# Patient Record
Sex: Male | Born: 1935 | Race: Black or African American | Hispanic: No | Marital: Married | Smoking: Former smoker
Health system: Southern US, Community
[De-identification: ages and names within clinical notes are randomized; demographics above are authoritative.]

## PROBLEM LIST (undated history)

## (undated) DIAGNOSIS — I872 Venous insufficiency (chronic) (peripheral): Secondary | ICD-10-CM

## (undated) DIAGNOSIS — R569 Unspecified convulsions: Secondary | ICD-10-CM

## (undated) DIAGNOSIS — E785 Hyperlipidemia, unspecified: Secondary | ICD-10-CM

## (undated) DIAGNOSIS — N529 Male erectile dysfunction, unspecified: Secondary | ICD-10-CM

## (undated) DIAGNOSIS — I10 Essential (primary) hypertension: Secondary | ICD-10-CM

## (undated) DIAGNOSIS — L821 Other seborrheic keratosis: Secondary | ICD-10-CM

## (undated) DIAGNOSIS — I509 Heart failure, unspecified: Secondary | ICD-10-CM

## (undated) DIAGNOSIS — M1990 Unspecified osteoarthritis, unspecified site: Secondary | ICD-10-CM

## (undated) DIAGNOSIS — C44629 Squamous cell carcinoma of skin of left upper limb, including shoulder: Secondary | ICD-10-CM

## (undated) HISTORY — PX: HERNIA REPAIR: SHX51

## (undated) HISTORY — DX: Essential (primary) hypertension: I10

## (undated) HISTORY — DX: Hyperlipidemia, unspecified: E78.5

## (undated) HISTORY — DX: Heart failure, unspecified: I50.9

## (undated) HISTORY — PX: EYE SURGERY: SHX253

## (undated) HISTORY — DX: Squamous cell carcinoma of skin of left upper limb, including shoulder: C44.629

## (undated) HISTORY — PX: SKIN CANCER EXCISION: SHX779

## (undated) HISTORY — DX: Male erectile dysfunction, unspecified: N52.9

## (undated) HISTORY — DX: Venous insufficiency (chronic) (peripheral): I87.2

## (undated) HISTORY — DX: Other seborrheic keratosis: L82.1

## (undated) HISTORY — DX: Unspecified osteoarthritis, unspecified site: M19.90

---

## 1997-04-05 DIAGNOSIS — R569 Unspecified convulsions: Secondary | ICD-10-CM

## 1997-04-05 HISTORY — DX: Unspecified convulsions: R56.9

## 1997-09-10 ENCOUNTER — Other Ambulatory Visit: Admission: RE | Admit: 1997-09-10 | Discharge: 1997-09-10 | Payer: Self-pay | Admitting: Family Medicine

## 1997-10-01 ENCOUNTER — Ambulatory Visit (HOSPITAL_COMMUNITY): Admission: RE | Admit: 1997-10-01 | Discharge: 1997-10-01 | Payer: Self-pay | Admitting: Family Medicine

## 2002-04-05 DIAGNOSIS — I509 Heart failure, unspecified: Secondary | ICD-10-CM

## 2002-04-05 HISTORY — DX: Heart failure, unspecified: I50.9

## 2002-11-10 ENCOUNTER — Encounter: Payer: Self-pay | Admitting: Emergency Medicine

## 2002-11-10 ENCOUNTER — Inpatient Hospital Stay (HOSPITAL_COMMUNITY): Admission: AD | Admit: 2002-11-10 | Discharge: 2002-11-14 | Payer: Self-pay | Admitting: Emergency Medicine

## 2002-11-12 ENCOUNTER — Encounter (INDEPENDENT_AMBULATORY_CARE_PROVIDER_SITE_OTHER): Payer: Self-pay | Admitting: Cardiology

## 2002-11-12 ENCOUNTER — Encounter: Payer: Self-pay | Admitting: Family Medicine

## 2002-11-14 ENCOUNTER — Encounter: Payer: Self-pay | Admitting: Internal Medicine

## 2003-12-06 ENCOUNTER — Ambulatory Visit: Payer: Self-pay | Admitting: *Deleted

## 2003-12-23 ENCOUNTER — Ambulatory Visit: Payer: Self-pay | Admitting: Nurse Practitioner

## 2003-12-23 ENCOUNTER — Ambulatory Visit: Payer: Self-pay | Admitting: *Deleted

## 2004-01-14 ENCOUNTER — Ambulatory Visit: Payer: Self-pay | Admitting: Nurse Practitioner

## 2004-01-28 ENCOUNTER — Ambulatory Visit: Payer: Self-pay | Admitting: Nurse Practitioner

## 2004-02-11 ENCOUNTER — Ambulatory Visit: Payer: Self-pay | Admitting: Nurse Practitioner

## 2004-03-16 ENCOUNTER — Ambulatory Visit: Payer: Self-pay | Admitting: Nurse Practitioner

## 2004-03-31 ENCOUNTER — Ambulatory Visit: Payer: Self-pay | Admitting: Nurse Practitioner

## 2004-11-18 ENCOUNTER — Ambulatory Visit: Payer: Self-pay | Admitting: Internal Medicine

## 2004-12-04 ENCOUNTER — Inpatient Hospital Stay (HOSPITAL_COMMUNITY): Admission: EM | Admit: 2004-12-04 | Discharge: 2004-12-09 | Payer: Self-pay | Admitting: Emergency Medicine

## 2004-12-04 ENCOUNTER — Ambulatory Visit: Payer: Self-pay | Admitting: Internal Medicine

## 2004-12-11 ENCOUNTER — Ambulatory Visit: Payer: Self-pay | Admitting: Internal Medicine

## 2004-12-14 ENCOUNTER — Ambulatory Visit: Payer: Self-pay | Admitting: Internal Medicine

## 2005-01-02 ENCOUNTER — Emergency Department (HOSPITAL_COMMUNITY): Admission: EM | Admit: 2005-01-02 | Discharge: 2005-01-02 | Payer: Self-pay | Admitting: Emergency Medicine

## 2005-01-19 ENCOUNTER — Ambulatory Visit: Payer: Self-pay | Admitting: Internal Medicine

## 2005-01-22 ENCOUNTER — Ambulatory Visit: Payer: Self-pay | Admitting: Internal Medicine

## 2005-01-26 ENCOUNTER — Inpatient Hospital Stay (HOSPITAL_COMMUNITY): Admission: RE | Admit: 2005-01-26 | Discharge: 2005-01-28 | Payer: Self-pay | Admitting: Urology

## 2005-01-26 ENCOUNTER — Encounter (INDEPENDENT_AMBULATORY_CARE_PROVIDER_SITE_OTHER): Payer: Self-pay | Admitting: *Deleted

## 2005-09-17 ENCOUNTER — Ambulatory Visit: Payer: Self-pay | Admitting: Internal Medicine

## 2005-09-24 ENCOUNTER — Ambulatory Visit: Payer: Self-pay | Admitting: Internal Medicine

## 2005-12-23 ENCOUNTER — Ambulatory Visit: Payer: Self-pay | Admitting: Internal Medicine

## 2006-02-09 DIAGNOSIS — I10 Essential (primary) hypertension: Secondary | ICD-10-CM | POA: Insufficient documentation

## 2006-02-09 DIAGNOSIS — F528 Other sexual dysfunction not due to a substance or known physiological condition: Secondary | ICD-10-CM | POA: Insufficient documentation

## 2006-04-26 DIAGNOSIS — E785 Hyperlipidemia, unspecified: Secondary | ICD-10-CM

## 2006-05-27 ENCOUNTER — Ambulatory Visit: Payer: Self-pay | Admitting: Internal Medicine

## 2006-05-27 ENCOUNTER — Encounter (INDEPENDENT_AMBULATORY_CARE_PROVIDER_SITE_OTHER): Payer: Self-pay | Admitting: Internal Medicine

## 2006-05-27 DIAGNOSIS — M1990 Unspecified osteoarthritis, unspecified site: Secondary | ICD-10-CM | POA: Insufficient documentation

## 2006-05-27 LAB — CONVERTED CEMR LAB
ALT: 14 units/L (ref 0–53)
AST: 17 units/L (ref 0–37)
Albumin: 4.5 g/dL (ref 3.5–5.2)
Alkaline Phosphatase: 81 units/L (ref 39–117)
BUN: 13 mg/dL (ref 6–23)
CO2: 24 meq/L (ref 19–32)
Calcium: 9.5 mg/dL (ref 8.4–10.5)
Chloride: 104 meq/L (ref 96–112)
Creatinine, Ser: 1.12 mg/dL (ref 0.40–1.50)
Glucose, Bld: 101 mg/dL — ABNORMAL HIGH (ref 70–99)
Potassium: 4.2 meq/L (ref 3.5–5.3)
Sodium: 140 meq/L (ref 135–145)
Total Bilirubin: 0.6 mg/dL (ref 0.3–1.2)
Total Protein: 8.2 g/dL (ref 6.0–8.3)

## 2006-05-31 ENCOUNTER — Encounter (INDEPENDENT_AMBULATORY_CARE_PROVIDER_SITE_OTHER): Payer: Self-pay | Admitting: Internal Medicine

## 2006-05-31 ENCOUNTER — Ambulatory Visit: Payer: Self-pay | Admitting: Internal Medicine

## 2006-05-31 LAB — CONVERTED CEMR LAB
Cholesterol: 152 mg/dL (ref 0–200)
HDL: 43 mg/dL (ref >39)
LDL Cholesterol: 92 mg/dL (ref 0–99)
Triglycerides: 86 mg/dL (ref <150)
VLDL: 17 mg/dL (ref 0–40)

## 2006-06-06 ENCOUNTER — Telehealth (INDEPENDENT_AMBULATORY_CARE_PROVIDER_SITE_OTHER): Payer: Self-pay | Admitting: *Deleted

## 2006-06-21 ENCOUNTER — Ambulatory Visit: Payer: Self-pay | Admitting: Gastroenterology

## 2006-06-29 ENCOUNTER — Ambulatory Visit: Payer: Self-pay | Admitting: Internal Medicine

## 2006-07-04 ENCOUNTER — Encounter (INDEPENDENT_AMBULATORY_CARE_PROVIDER_SITE_OTHER): Payer: Self-pay | Admitting: Internal Medicine

## 2006-07-04 ENCOUNTER — Ambulatory Visit: Payer: Self-pay | Admitting: Gastroenterology

## 2006-07-04 LAB — HM COLONOSCOPY: HM Colonoscopy: NORMAL

## 2006-07-28 ENCOUNTER — Ambulatory Visit: Payer: Self-pay | Admitting: Internal Medicine

## 2006-08-04 ENCOUNTER — Telehealth: Payer: Self-pay | Admitting: *Deleted

## 2006-08-20 ENCOUNTER — Emergency Department (HOSPITAL_COMMUNITY): Admission: EM | Admit: 2006-08-20 | Discharge: 2006-08-20 | Payer: Self-pay | Admitting: Emergency Medicine

## 2006-09-12 ENCOUNTER — Encounter (INDEPENDENT_AMBULATORY_CARE_PROVIDER_SITE_OTHER): Payer: Self-pay | Admitting: Internal Medicine

## 2006-09-20 ENCOUNTER — Encounter (INDEPENDENT_AMBULATORY_CARE_PROVIDER_SITE_OTHER): Payer: Self-pay | Admitting: General Surgery

## 2006-09-20 ENCOUNTER — Ambulatory Visit (HOSPITAL_BASED_OUTPATIENT_CLINIC_OR_DEPARTMENT_OTHER): Admission: RE | Admit: 2006-09-20 | Discharge: 2006-09-20 | Payer: Self-pay | Admitting: General Surgery

## 2006-11-07 ENCOUNTER — Telehealth (INDEPENDENT_AMBULATORY_CARE_PROVIDER_SITE_OTHER): Payer: Self-pay | Admitting: Internal Medicine

## 2007-05-30 ENCOUNTER — Telehealth (INDEPENDENT_AMBULATORY_CARE_PROVIDER_SITE_OTHER): Payer: Self-pay | Admitting: Internal Medicine

## 2007-06-19 ENCOUNTER — Telehealth (INDEPENDENT_AMBULATORY_CARE_PROVIDER_SITE_OTHER): Payer: Self-pay | Admitting: Internal Medicine

## 2007-11-27 ENCOUNTER — Ambulatory Visit: Payer: Self-pay | Admitting: Internal Medicine

## 2007-11-29 ENCOUNTER — Ambulatory Visit: Payer: Self-pay | Admitting: *Deleted

## 2007-11-29 ENCOUNTER — Encounter (INDEPENDENT_AMBULATORY_CARE_PROVIDER_SITE_OTHER): Payer: Self-pay | Admitting: Internal Medicine

## 2007-11-29 LAB — CONVERTED CEMR LAB
ALT: 14 units/L (ref 0–53)
AST: 17 units/L (ref 0–37)
Albumin: 4.3 g/dL (ref 3.5–5.2)
Alkaline Phosphatase: 60 units/L (ref 39–117)
BUN: 19 mg/dL (ref 6–23)
CO2: 22 meq/L (ref 19–32)
Calcium: 9.2 mg/dL (ref 8.4–10.5)
Chloride: 110 meq/L (ref 96–112)
Creatinine, Ser: 1.2 mg/dL (ref 0.40–1.50)
Glucose, Bld: 99 mg/dL (ref 70–99)
Hgb A1c MFr Bld: 5.4 %
Potassium: 4.7 meq/L (ref 3.5–5.3)
Sodium: 143 meq/L (ref 135–145)
Total Bilirubin: 0.7 mg/dL (ref 0.3–1.2)
Total Protein: 7.6 g/dL (ref 6.0–8.3)

## 2008-01-18 ENCOUNTER — Telehealth (INDEPENDENT_AMBULATORY_CARE_PROVIDER_SITE_OTHER): Payer: Self-pay | Admitting: Internal Medicine

## 2008-05-24 ENCOUNTER — Telehealth (INDEPENDENT_AMBULATORY_CARE_PROVIDER_SITE_OTHER): Payer: Self-pay | Admitting: Internal Medicine

## 2008-07-16 ENCOUNTER — Telehealth (INDEPENDENT_AMBULATORY_CARE_PROVIDER_SITE_OTHER): Payer: Self-pay | Admitting: Internal Medicine

## 2008-12-11 ENCOUNTER — Encounter: Payer: Self-pay | Admitting: Internal Medicine

## 2008-12-12 ENCOUNTER — Ambulatory Visit: Payer: Self-pay | Admitting: Internal Medicine

## 2008-12-12 ENCOUNTER — Inpatient Hospital Stay (HOSPITAL_COMMUNITY): Admission: EM | Admit: 2008-12-12 | Discharge: 2008-12-16 | Payer: Self-pay | Admitting: Emergency Medicine

## 2008-12-12 LAB — CONVERTED CEMR LAB
Cholesterol: 100 mg/dL
HDL: 34 mg/dL
LDL Cholesterol: 54 mg/dL
Total CHOL/HDL Ratio: 2.9
Triglycerides: 59 mg/dL
VLDL: 12 mg/dL

## 2008-12-16 ENCOUNTER — Encounter: Payer: Self-pay | Admitting: Internal Medicine

## 2008-12-26 ENCOUNTER — Ambulatory Visit: Payer: Self-pay | Admitting: Internal Medicine

## 2008-12-26 DIAGNOSIS — I5032 Chronic diastolic (congestive) heart failure: Secondary | ICD-10-CM

## 2008-12-27 ENCOUNTER — Encounter (INDEPENDENT_AMBULATORY_CARE_PROVIDER_SITE_OTHER): Payer: Self-pay | Admitting: Internal Medicine

## 2008-12-27 LAB — CONVERTED CEMR LAB
BUN: 11 mg/dL (ref 6–23)
Bilirubin Urine: NEGATIVE
CO2: 20 meq/L (ref 19–32)
Calcium: 9.1 mg/dL (ref 8.4–10.5)
Chloride: 108 meq/L (ref 96–112)
Creatinine, Ser: 1.01 mg/dL (ref 0.40–1.50)
Glucose, Bld: 81 mg/dL (ref 70–99)
HCT: 37.8 % — ABNORMAL LOW (ref 39.0–52.0)
Hemoglobin, Urine: NEGATIVE
Hemoglobin: 11.8 g/dL — ABNORMAL LOW (ref 13.0–17.0)
Ketones, ur: NEGATIVE mg/dL
Leukocytes, UA: NEGATIVE
MCHC: 31.2 g/dL (ref 30.0–36.0)
MCV: 96.7 fL (ref >=78.0)
Nitrite: NEGATIVE
Platelets: 293 10*3/uL (ref 150–400)
Potassium: 3.8 meq/L (ref 3.5–5.3)
Protein, ur: NEGATIVE mg/dL
RBC: 3.91 M/uL — ABNORMAL LOW (ref 4.22–5.81)
RDW: 13.6 % (ref 11.5–15.5)
Sodium: 141 meq/L (ref 135–145)
Specific Gravity, Urine: 1.015 (ref 1.005–1.0)
Urine Glucose: NEGATIVE mg/dL
Urobilinogen, UA: 0.2 (ref 0.0–1.0)
WBC: 6.4 10*3/uL (ref 4.0–10.5)
pH: 5.5 (ref 5.0–8.0)

## 2009-01-27 ENCOUNTER — Ambulatory Visit: Payer: Self-pay | Admitting: Internal Medicine

## 2009-01-27 LAB — CONVERTED CEMR LAB
BUN: 19 mg/dL (ref 6–23)
CO2: 21 meq/L (ref 19–32)
Calcium: 9.4 mg/dL (ref 8.4–10.5)
Chloride: 108 meq/L (ref 96–112)
Creatinine, Ser: 1.24 mg/dL (ref 0.40–1.50)
Glucose, Bld: 96 mg/dL (ref 70–99)
LDL Goal: 130 mg/dL
Potassium: 4.7 meq/L (ref 3.5–5.3)
Sodium: 142 meq/L (ref 135–145)

## 2009-03-03 ENCOUNTER — Ambulatory Visit: Payer: Self-pay | Admitting: Internal Medicine

## 2009-03-03 LAB — CONVERTED CEMR LAB
BUN: 18 mg/dL (ref 6–23)
CO2: 19 meq/L (ref 19–32)
Calcium: 9.2 mg/dL (ref 8.4–10.5)
Chloride: 104 meq/L (ref 96–112)
Creatinine, Ser: 1.07 mg/dL (ref 0.40–1.50)
Glucose, Bld: 101 mg/dL — ABNORMAL HIGH (ref 70–99)
Potassium: 4.6 meq/L (ref 3.5–5.3)
Sodium: 142 meq/L (ref 135–145)

## 2009-08-12 ENCOUNTER — Ambulatory Visit: Payer: Self-pay | Admitting: Internal Medicine

## 2009-08-13 LAB — CONVERTED CEMR LAB
ALT: 15 units/L (ref 0–53)
AST: 19 units/L (ref 0–37)
Albumin: 4.3 g/dL (ref 3.5–5.2)
Alkaline Phosphatase: 71 units/L (ref 39–117)
BUN: 11 mg/dL (ref 6–23)
CO2: 25 meq/L (ref 19–32)
Calcium: 9.1 mg/dL (ref 8.4–10.5)
Chloride: 105 meq/L (ref 96–112)
Creatinine, Ser: 0.97 mg/dL (ref 0.40–1.50)
Glucose, Bld: 91 mg/dL (ref 70–99)
Potassium: 3.8 meq/L (ref 3.5–5.3)
Sodium: 141 meq/L (ref 135–145)
Total Bilirubin: 0.5 mg/dL (ref 0.3–1.2)
Total Protein: 7.6 g/dL (ref 6.0–8.3)

## 2009-11-25 ENCOUNTER — Encounter: Payer: Self-pay | Admitting: Internal Medicine

## 2010-01-06 ENCOUNTER — Encounter: Payer: Self-pay | Admitting: Internal Medicine

## 2010-03-17 ENCOUNTER — Telehealth: Payer: Self-pay | Admitting: Internal Medicine

## 2010-03-24 ENCOUNTER — Ambulatory Visit: Payer: Self-pay | Admitting: Internal Medicine

## 2010-04-16 ENCOUNTER — Telehealth: Payer: Self-pay | Admitting: Internal Medicine

## 2010-05-05 NOTE — Consult Note (Signed)
Summary: MARK FEATHERSTON,MD  MARK FEATHERSTON,MD   Imported By: Louretta Parma 02/04/2010 12:14:16  _____________________________________________________________________  External Attachment:    Type:   Image     Comment:   External Document

## 2010-05-05 NOTE — Assessment & Plan Note (Signed)
Summary: est-ck/fu/meds/cfb   Vital Signs:  Patient profile:   75 year old male Height:      74 inches (187.96 cm) Weight:      192.5 pounds (84.50 kg) BMI:     24.80 Temp:     97.2 degrees F (36.22 degrees C) oral Pulse rate:   65 / minute BP sitting:   157 / 85  (right arm) Cuff size:   regular  Vitals Entered By: Theotis Barrio NT II (Aug 12, 2009 2:54 PM)  Serial Vital Signs/Assessments:  Time      Position  BP       Pulse  Resp  Temp     By 3:28 PM             136/76                         Nilda Riggs MD  Comments: 3:28 PM Re-checked by Dr. Logan Bores in exam room. By: Nilda Riggs MD   CC: PATIENT STATES HE FOR ROUTINE OFFICE VISIT FOR MEDICATION REFILL/ NO CONCERNS NOR COMPLAINTS Is Patient Diabetic? No Pain Assessment Patient in pain? no      Nutritional Status BMI of 19 -24 = normal  Have you ever been in a relationship where you felt threatened, hurt or afraid?No   Does patient need assistance? Functional Status Self care Ambulation Normal Comments ROUTINE OFFICE VISIT / MEDICATION REFILL / NO CONCERNS NOR COMPLAINTS   Primary Care Provider:  Nilda Riggs MD  CC:  PATIENT STATES HE FOR ROUTINE OFFICE VISIT FOR MEDICATION REFILL/ NO CONCERNS NOR COMPLAINTS.  History of Present Illness: Patient is here for durg refills and states that he is otherwise doing fine medically. He has no complaints with the exception of the cost of cialis. A full review of systems was performed and the patient denies visual changes (he just got new glasses as well), headaches, chest pain, shortness of breath, abdominal complaints, urinary complaints, weakness, or joint pains.  Preventive Screening-Counseling & Management  Alcohol-Tobacco     Alcohol drinks/day: 0     Smoking Status: quit     Year Quit: 1983     Pack years: 10     Passive Smoke Exposure: no  Caffeine-Diet-Exercise     Does Patient Exercise: yes     Type of exercise: walking and yard work     Times/week:  7  Current Problems (verified): 1)  CHF  (ICD-428.0) 2)  Preventive Health Care  (ICD-V70.0) 3)  Antihyperlipidemic Use, Long Term  (ICD-V58.69) 4)  Osteoarthritis  (ICD-715.90) 5)  Hypertension  (ICD-401.9) 6)  Hyperlipidemia  (ICD-272.4) 7)  Dependent Edema, Legs  (ICD-782.3) 8)  Erectile Dysfunction  (ICD-302.72)  Medications Prior to Update: 1)  Metoprolol Tartrate 25 Mg Tabs (Metoprolol Tartrate) .... Take One Tablet By Mouth Two Times A Day 2)  Simvastatin 40 Mg Tabs (Simvastatin) .... Take One Tablet By Mouth Once Daily 3)  Cialis 10 Mg Tabs (Tadalafil) .... Take At Least 1 Hour Prior To Intended Intercourse 4)  Potassium Chloride 20 Meq Pack (Potassium Chloride) .... Take 1 Tablet By Mouth Once A Day 5)  Furosemide 40 Mg Tabs (Furosemide) .... Take 1 Tablet By Mouth Once A Day  Current Medications (verified): 1)  Metoprolol Tartrate 25 Mg Tabs (Metoprolol Tartrate) .... Take One Tablet By Mouth Two Times A Day 2)  Simvastatin 40 Mg Tabs (Simvastatin) .... Take One Tablet By Mouth Once Daily 3)  Cialis  10 Mg Tabs (Tadalafil) .... Take At Least 1 Hour Prior To Intended Intercourse 4)  Potassium Chloride 20 Meq Pack (Potassium Chloride) .... Take 1 Tablet By Mouth Once A Day 5)  Furosemide 40 Mg Tabs (Furosemide) .... Take 1 Tablet By Mouth Once A Day  Allergies (verified): 1)  ! Fosinopril Sodium (Fosinopril Sodium) 2)  ! * "ace Inhibitors"  Social History: Lives in [place] with wife. Retired from a [employer], and was a Engineer, production. Quit smoking in 1980. Used to smoke 1.5 pk/day x 4years. No Etoh, No IVDU.  Review of Systems      See HPI  Physical Exam  Extremities:  Chronic edema, not much pitting. Patient states that the swelling is stable and resolves at night with elevation of the legs.   Impression & Recommendations:  Problem # 1:  HYPERTENSION (ICD-401.9) Rechecked in the room - 136/76. Unchanged from last visit, continue current meds. Refill potassium  given Lasix use and check CMET.  His updated medication list for this problem includes:    Metoprolol Tartrate 25 Mg Tabs (Metoprolol tartrate) .Marland Kitchen... Take one tablet by mouth two times a day    Furosemide 40 Mg Tabs (Furosemide) .Marland Kitchen... Take 1 tablet by mouth once a day  BP today: 157/85 Prior BP: 130/70 (03/03/2009)  Prior 10 Yr Risk Heart Disease: 22 % (01/27/2009)  Labs Reviewed: K+: 4.6 (03/03/2009) Creat: : 1.07 (03/03/2009)   Chol: 100 (12/12/2008)   HDL: 34 (12/12/2008)   LDL: 54 (12/12/2008)   TG: 59 (12/12/2008)  Problem # 2:  HYPERLIPIDEMIA (ICD-272.4) On statin, no LFTs since 2009, recheck today. Last cholesterol studies were excellent.  His updated medication list for this problem includes:    Simvastatin 40 Mg Tabs (Simvastatin) .Marland Kitchen... Take one tablet by mouth once daily  Problem # 3:  DEPENDENT EDEMA, LEGS (ICD-782.3) Patient states that edema is at baseline. Legs are visually large in circumference, especially noticeable at the level of the ankles, but patient denies any complaints or recent change in the size of his legs. Mild pitting, but not impressive. Patient continues to elevate legs when at home and states that this manages the edema well. Encouraged to continue with those measures to control the edema.  His updated medication list for this problem includes:    Furosemide 40 Mg Tabs (Furosemide) .Marland Kitchen... Take 1 tablet by mouth once a day     Problem # 4:  ERECTILE DYSFUNCTION (ICD-302.72) Refilled medication in case the patient decides to fill the prescription. States that he doesn't appreciate the high cost of the medication.  His updated medication list for this problem includes:    Cialis 10 Mg Tabs (Tadalafil) .Marland Kitchen... Take at least 1 hour prior to intended intercourse  Complete Medication List: 1)  Metoprolol Tartrate 25 Mg Tabs (Metoprolol tartrate) .... Take one tablet by mouth two times a day 2)  Simvastatin 40 Mg Tabs (Simvastatin) .... Take one tablet by  mouth once daily 3)  Cialis 10 Mg Tabs (Tadalafil) .... Take at least 1 hour prior to intended intercourse 4)  Potassium Chloride 20 Meq Pack (Potassium chloride) .... Take 1 tablet by mouth once a day 5)  Furosemide 40 Mg Tabs (Furosemide) .... Take 1 tablet by mouth once a day  Other Orders: T-Comprehensive Metabolic Panel ([id number])  Patient Instructions: 1)  Please schedule a follow-up appointment in 6 months. 2)  If you have any acute issues prior to your visit, please arrange an earlier appointment. 3)  Check your Blood  Pressure regularly. If it is above: 160/100 you should make an appointment. Prescriptions: FUROSEMIDE 40 MG TABS (FUROSEMIDE) Take 1 tablet by mouth once a day  #30 x 6   Entered and Authorized by:   Nilda Riggs MD   Signed by:   Nilda Riggs MD on 08/12/2009   Method used:   Electronically to        [facility] [address] [phone]* (retail)       [address]       [place], [place]  [address]       Ph: [phone]       Fax: [phone]   RxID:   [id number] POTASSIUM CHLORIDE 20 MEQ PACK (POTASSIUM CHLORIDE) Take 1 tablet by mouth once a day  #30 Tablet x 6   Entered and Authorized by:   Nilda Riggs MD   Signed by:   Nilda Riggs MD on 08/12/2009   Method used:   Electronically to        [facility] [address] [phone]* (retail)       [address]       [place], [place]  [address]       Ph: [phone]       Fax: [phone]   RxID:   [id number] CIALIS 10 MG TABS (TADALAFIL) Take at least 1 hour prior to intended intercourse  #10 x 4   Entered and Authorized by:   Nilda Riggs MD   Signed by:   Nilda Riggs MD on 08/12/2009   Method used:   Electronically to        [facility] [address] [phone]* (retail)       [address]       [place], [place]  [address]       Ph: [phone]       Fax: [phone]   RxID:   [id number] SIMVASTATIN 40 MG TABS (SIMVASTATIN) Take one tablet by mouth once daily  #31 Tablet x 10   Entered and Authorized  by:   Nilda Riggs MD   Signed by:   Nilda Riggs MD on 08/12/2009   Method used:   Electronically to        [facility] [address] [phone]* (retail)       [address]       [place], [place]  [address]       Ph: [phone]       Fax: [phone]   RxID:   [id number] METOPROLOL TARTRATE 25 MG TABS (METOPROLOL TARTRATE) Take one tablet by mouth two times a day  #62 x 11   Entered and Authorized by:   Nilda Riggs MD   Signed by:   Nilda Riggs MD on 08/12/2009   Method used:   Electronically to        [facility] [address] [phone]* (retail)       [address]       [place], [place]  [address]       Ph: [phone]       Fax: [phone]   RxID:   [id number]  Process Orders Check Orders Results:     [facility]: Check successful Tests Sent for requisitioning (Aug 12, 2009 5:47 PM):     08/12/2009: [facility] -- T-Comprehensive Metabolic Panel [phone] (signed)    Prevention & Chronic Care Immunizations   Influenza vaccine: Fluvax MCR  (03/03/2009)    Tetanus booster: 03/03/2009: Tdap    Pneumococcal vaccine: Pneumovax (Medicare)  (  03/03/2009)    H. zoster vaccine: Not documented   H. zoster vaccine deferral: Deferred  (03/03/2009)  Colorectal Screening   Hemoccult: Not documented   Hemoccult action/deferral: Deferred  (03/03/2009)    Colonoscopy: normal  (07/04/2006)   Colonoscopy due: 07/2016  Other Screening   PSA: Not documented   PSA action/deferral: Discussion deferred  (03/03/2009)   Smoking status: quit  (08/12/2009)  Lipids   Total Cholesterol: 100  (12/12/2008)   LDL: 54  (12/12/2008)   LDL Direct: Not documented   HDL: 34  (12/12/2008)   Triglycerides: 59  (12/12/2008)    SGOT (AST): 17  (11/29/2007)   BMP action: Ordered   SGPT (ALT): 14  (11/29/2007) CMP ordered    Alkaline phosphatase: 60  (11/29/2007)   Total bilirubin: 0.7  (11/29/2007)    Lipid flowsheet reviewed?: Yes   Progress toward  LDL goal: At goal  Hypertension   Last Blood Pressure: 157 / 85  (08/12/2009)   Serum creatinine: 1.07  (03/03/2009)   BMP action: Ordered   Serum potassium 4.6  (03/03/2009) CMP ordered     Hypertension flowsheet reviewed?: Yes   Progress toward BP goal: Unchanged  Self-Management Support :   Personal Goals (by the next clinic visit) :      Personal blood pressure goal: 140/90  (01/27/2009)     Personal LDL goal: 130  (01/27/2009)    Patient will work on the following items until the next clinic visit to reach self-care goals:     Medications and monitoring: take my medicines every day, bring all of my medications to every visit  (08/12/2009)     Eating: drink diet soda or water instead of juice or soda, eat more vegetables, use fresh or frozen vegetables, eat foods that are low in salt, eat baked foods instead of fried foods, limit or avoid alcohol  (08/12/2009)     Activity: take a 30 minute walk every day  (08/12/2009)    Hypertension self-management support: Resources for patients handout  (08/12/2009)    Lipid self-management support: Resources for patients handout  (08/12/2009)     Self-management comments: PATIET STATES HE AND HIS  WALKS EVERY DAY      Resource handout printed.

## 2010-05-05 NOTE — Consult Note (Signed)
Summary: [facility]  [facility]   Imported By: Louretta Parma 12/12/2009 10:59:00  _____________________________________________________________________  External Attachment:    Type:   Image     Comment:   External Document

## 2010-05-07 NOTE — Progress Notes (Signed)
Summary: Refill/gh  Phone Note Refill Request Message from:  Fax from Pharmacy on March 17, 2010 11:50 AM  Refills Requested: Medication #1:  POTASSIUM CHLORIDE 20 MEQ PACK Take 1 tablet by mouth once a day   Last Refilled: 02/16/2010  Method Requested: Electronic Initial call taken by: Angelina Ok RN,  March 17, 2010 11:50 AM  Follow-up for Phone Call        Call for appointment Follow-up by: Bethel Born MD,  March 17, 2010 1:48 PM    Prescriptions: POTASSIUM CHLORIDE 20 MEQ PACK (POTASSIUM CHLORIDE) Take 1 tablet by mouth once a day  #30 Tablet x 1   Entered and Authorized by:   Bethel Born MD   Signed by:   Bethel Born MD on 03/17/2010   Method used:   Electronically to        [facility] [address] [phone]* (retail)       [address]       [place], [place]  [address]       Ph: [phone]       Fax: [phone]   RxID:   [id number]

## 2010-05-07 NOTE — Progress Notes (Signed)
Summary: refill/ hla  Phone Note Refill Request Message from:  Fax from Pharmacy on April 16, 2010 1:54 PM  Refills Requested: Medication #1:  FUROSEMIDE 40 MG TABS Take 1 tablet by mouth once a day.   Dosage confirmed as above?Dosage Confirmed   Last Refilled: 12/15 Initial call taken by: Marin Roberts RN,  April 16, 2010 1:55 PM  Follow-up for Phone Call       Follow-up by: Bethel Born MD,  April 16, 2010 4:42 PM    Prescriptions: FUROSEMIDE 40 MG TABS (FUROSEMIDE) Take 1 tablet by mouth once a day  #30 x 6   Entered and Authorized by:   Bethel Born MD   Signed by:   Bethel Born MD on 04/16/2010   Method used:   Electronically to        [facility] [phone]* (retail)       [address]       [place], [place]  [address]       Ph: [phone]       Fax: [phone]   RxID:   [id number]

## 2010-05-07 NOTE — Assessment & Plan Note (Signed)
Summary: EST-CK/FU/MEDS/CFB   Vital Signs:  Patient profile:   75 year old male Height:      74 inches (187.96 cm) Weight:      179.9 pounds (81.77 kg) BMI:     23.18 Temp:     97.8 degrees F (36.56 degrees C) oral Pulse rate:   68 / minute BP sitting:   132 / 78  (left arm) Cuff size:   regular  Vitals Entered By: Cynda Familia Duncan Dull) (March 24, 2010 3:37 PM) CC: routine f/u Is Patient Diabetic? No Pain Assessment Patient in pain? no      Nutritional Status BMI of 19 -24 = normal  Have you ever been in a relationship where you felt threatened, hurt or afraid?No   Does patient need assistance? Functional Status Self care Ambulation Normal    Primary Care Provider:  Bethel Born MD  CC:  routine f/u.  History of Present Illness: 75 y/o with h/o HTN, HLD, chronic venous insufficiency comes for follow up  no new complaints today  HTN- BP is well cotrolled when he checks it at pharmacies. He has been complaint with meds.  HLD- last Lipid profile checked in 2009, at goal, not fasting today venous insufficicncy- is going to have surgery for symptomatic relief in near future, dates not decided yet  Current Medications (verified): 1)  Metoprolol Tartrate 25 Mg Tabs (Metoprolol Tartrate) .... Take One Tablet By Mouth Two Times A Day 2)  Simvastatin 40 Mg Tabs (Simvastatin) .... Take One Tablet By Mouth Once Daily 3)  Cialis 10 Mg Tabs (Tadalafil) .... Take At Least 1 Hour Prior To Intended Intercourse 4)  Potassium Chloride 20 Meq Pack (Potassium Chloride) .... Take 1 Tablet By Mouth Once A Day 5)  Furosemide 40 Mg Tabs (Furosemide) .... Take 1 Tablet By Mouth Once A Day  Allergies: 1)  ! Fosinopril Sodium (Fosinopril Sodium) 2)  ! * "ace Inhibitors"  Review of Systems  The patient denies anorexia, fever, weight loss, weight gain, vision loss, decreased hearing, hoarseness, chest pain, syncope, dyspnea on exertion, peripheral edema, prolonged cough, headaches,  hemoptysis, abdominal pain, melena, hematochezia, severe indigestion/heartburn, hematuria, incontinence, genital sores, muscle weakness, suspicious skin lesions, transient blindness, difficulty walking, depression, unusual weight change, abnormal bleeding, enlarged lymph nodes, angioedema, breast masses, and testicular masses.    Physical Exam  General:  Gen: VS reveiwed, Alert, well developed, nodistress ENT: mucous membranes pink & moist. No abnormal finds in ear and nose. CVC:S1 S2 , no murmurs, no abnormal heart sounds. Lungs: Clear to auscultation B/L. No wheezes, crackles or other abnormal sounds Abdomen: soft, non distended, no tender. Normal Bowel sounds EXT: no pitting edema, no engorged veins, Pulsations normal  Neuro:alert, oriented *3, cranial nerved 2-12 intact, strenght normal in all  extremities, senstations normal to light touch.      Impression & Recommendations:  Problem # 1:  CHF (ICD-428.0) no SOB, CP, or signs/symptoms of volume overload on lasix continue medications  His updated medication list for this problem includes:    Metoprolol Tartrate 25 Mg Tabs (Metoprolol tartrate) .Marland Kitchen... Take one tablet by mouth two times a day    Furosemide 40 Mg Tabs (Furosemide) .Marland Kitchen... Take 1 tablet by mouth once a day  Problem # 2:  HYPERTENSION (ICD-401.9) manually checked 132/90 conitnue current meds  His updated medication list for this problem includes:    Metoprolol Tartrate 25 Mg Tabs (Metoprolol tartrate) .Marland Kitchen... Take one tablet by mouth two times a day  Furosemide 40 Mg Tabs (Furosemide) .Marland Kitchen... Take 1 tablet by mouth once a day  Problem # 3:  HYPERLIPIDEMIA (ICD-272.4)  needs lipid profile, not fasting today will check it at next appointment continue statin at goal on last check  His updated medication list for this problem includes:    Simvastatin 40 Mg Tabs (Simvastatin) .Marland Kitchen... Take one tablet by mouth once daily  Labs Reviewed: SGOT: 19 (08/12/2009)   SGPT: 15  (08/12/2009)  Lipid Goals: LDL Goal: 130 (01/27/2009)     Prior 10 Yr Risk Heart Disease: 22 % (01/27/2009)   HDL:34 (12/12/2008), 43 (05/31/2006)  LDL:54 (12/12/2008), 92 (05/31/2006)  Chol:100 (12/12/2008), 152 (05/31/2006)  Trig:59 (12/12/2008), 86 (05/31/2006)  Problem # 4:  DEPENDENT EDEMA, LEGS (ICD-782.3) followed by vein specialist, is going to have surgery in few months  His updated medication list for this problem includes:    Furosemide 40 Mg Tabs (Furosemide) .Marland Kitchen... Take 1 tablet by mouth once a day  Complete Medication List: 1)  Metoprolol Tartrate 25 Mg Tabs (Metoprolol tartrate) .... Take one tablet by mouth two times a day 2)  Simvastatin 40 Mg Tabs (Simvastatin) .... Take one tablet by mouth once daily 3)  Cialis 10 Mg Tabs (Tadalafil) .... Take at least 1 hour prior to intended intercourse 4)  Potassium Chloride 20 Meq Pack (Potassium chloride) .... Take 1 tablet by mouth once a day 5)  Furosemide 40 Mg Tabs (Furosemide) .... Take 1 tablet by mouth once a day  Patient Instructions: 1)  Please schedule a follow-up appointment in 6 months.   Orders Added: 1)  Est. Patient Level IV [[address]]     Prevention & Chronic Care Immunizations   Influenza vaccine: Fluvax MCR  (03/03/2009)    Tetanus booster: 03/03/2009: Tdap    Pneumococcal vaccine: Pneumovax (Medicare)  (03/03/2009)    H. zoster vaccine: Not documented   H. zoster vaccine deferral: Deferred  (03/03/2009)  Colorectal Screening   Hemoccult: Not documented   Hemoccult action/deferral: Deferred  (03/03/2009)    Colonoscopy: normal  (07/04/2006)   Colonoscopy due: 07/2016  Other Screening   PSA: Not documented   PSA action/deferral: Discussion deferred  (03/03/2009)   Smoking status: quit  (08/12/2009)  Lipids   Total Cholesterol: 100  (12/12/2008)   LDL: 54  (12/12/2008)   LDL Direct: Not documented   HDL: 34  (12/12/2008)   Triglycerides: 59  (12/12/2008)    SGOT (AST): 19   (08/12/2009)   BMP action: Ordered   SGPT (ALT): 15  (08/12/2009)   Alkaline phosphatase: 71  (08/12/2009)   Total bilirubin: 0.5  (08/12/2009)    Lipid flowsheet reviewed?: Yes   Progress toward LDL goal: Unchanged  Hypertension   Last Blood Pressure: 132 / 78  (03/24/2010)   Serum creatinine: 0.97  (08/12/2009)   BMP action: Ordered   Serum potassium 3.8  (08/12/2009)    Hypertension flowsheet reviewed?: Yes   Progress toward BP goal: Improved  Self-Management Support :   Personal Goals (by the next clinic visit) :      Personal blood pressure goal: 140/90  (01/27/2009)     Personal LDL goal: 130  (01/27/2009)    Patient will work on the following items until the next clinic visit to reach self-care goals:     Medications and monitoring: take my medicines every day  (03/24/2010)     Eating: use fresh or frozen vegetables, eat foods that are low in salt, eat baked foods instead of fried foods  (  03/24/2010)     Activity: take a 30 minute walk every day  (08/12/2009)    Hypertension self-management support: Written self-care plan  (03/24/2010)   Hypertension self-care plan printed.    Lipid self-management support: Written self-care plan  (03/24/2010)   Lipid self-care plan printed.   Nursing Instructions: Give Flu vaccine today    Appended Document: flu vaccine//kg    Nurse Visit   Allergies: 1)  ! Fosinopril Sodium (Fosinopril Sodium) 2)  ! * "ace Inhibitors"  Orders Added: 1)  Flu Vaccine 72yrs + MEDICARE PATIENTS [Q2039] 2)  Administration Flu vaccine - MCR [G0008] Flu Vaccine Consent Questions     Do you have a history of severe allergic reactions to this vaccine? no    Any prior history of allergic reactions to egg and/or gelatin? no    Do you have a sensitivity to the preservative Thimersol? no    Do you have a past history of Guillan-Barre Syndrome? no    Do you currently have an acute febrile illness? no    Have you ever had a severe reaction to  latex? no    Vaccine information given and explained to patient? yes    Are you currently pregnant? no    Lot Number:[id number]   Exp Date:10/03/2010   Manufacturer: [employer]    Site Given  Left Deltoid IM.Cynda Familia [facility])  March 24, 2010 4:27 PM  .Neysa Bonito

## 2010-05-25 ENCOUNTER — Other Ambulatory Visit: Payer: Self-pay | Admitting: *Deleted

## 2010-05-26 MED ORDER — POTASSIUM CHLORIDE 20 MEQ PO PACK: 20.0000 meq | PACK | Freq: Two times a day (BID) | ORAL | Status: DC

## 2010-06-24 ENCOUNTER — Other Ambulatory Visit: Payer: Self-pay | Admitting: *Deleted

## 2010-06-24 MED ORDER — POTASSIUM CHLORIDE 20 MEQ PO PACK: 20.0000 meq | PACK | Freq: Two times a day (BID) | ORAL | Status: DC

## 2010-07-10 LAB — URINE CULTURE

## 2010-07-10 LAB — URINALYSIS, ROUTINE W REFLEX MICROSCOPIC
Bilirubin Urine: NEGATIVE
Glucose, UA: NEGATIVE mg/dL
Ketones, ur: NEGATIVE mg/dL
Nitrite: POSITIVE — AB
Protein, ur: 30 mg/dL — AB
Specific Gravity, Urine: 1.015 (ref 1.005–1.030)
Urobilinogen, UA: 1 mg/dL (ref 0.0–1.0)
pH: 5.5 (ref 5.0–8.0)

## 2010-07-10 LAB — BASIC METABOLIC PANEL
BUN: 11 mg/dL (ref 6–23)
BUN: 13 mg/dL (ref 6–23)
CO2: 26 mEq/L (ref 19–32)
CO2: 29 mEq/L (ref 19–32)
Calcium: 8.5 mg/dL (ref 8.4–10.5)
Calcium: 9 mg/dL (ref 8.4–10.5)
Chloride: 104 mEq/L (ref 96–112)
Chloride: 105 mEq/L (ref 96–112)
Creatinine, Ser: 0.99 mg/dL (ref 0.4–1.5)
Creatinine, Ser: 1.11 mg/dL (ref 0.4–1.5)
GFR calc Af Amer: >60 mL/min (ref >60)
Glucose, Bld: 94 mg/dL (ref 70–99)
Sodium: 140 mEq/L (ref 135–145)

## 2010-07-10 LAB — LEGIONELLA ANTIGEN, URINE: Legionella Antigen, Urine: NEGATIVE

## 2010-07-10 LAB — CARDIAC PANEL(CRET KIN+CKTOT+MB+TROPI)
Relative Index: INVALID (ref 0.0–2.5)
Troponin I: 0.1 ng/mL — ABNORMAL HIGH (ref 0.00–0.06)

## 2010-07-10 LAB — DIFFERENTIAL
Basophils Absolute: 0 10*3/uL (ref 0.0–0.1)
Basophils Absolute: 0 10*3/uL (ref 0.0–0.1)
Basophils Relative: 0 % (ref 0–1)
Eosinophils Absolute: 0 10*3/uL (ref 0.0–0.7)
Eosinophils Absolute: 0.1 10*3/uL (ref 0.0–0.7)
Eosinophils Relative: 0 % (ref 0–5)
Eosinophils Relative: 1 % (ref 0–5)
Lymphocytes Relative: 12 % (ref 12–46)
Lymphs Abs: 1 10*3/uL (ref 0.7–4.0)
Lymphs Abs: 1.4 10*3/uL (ref 0.7–4.0)
Monocytes Absolute: 1.6 10*3/uL — ABNORMAL HIGH (ref 0.1–1.0)
Monocytes Absolute: 1.7 10*3/uL — ABNORMAL HIGH (ref 0.1–1.0)
Monocytes Relative: 19 % — ABNORMAL HIGH (ref 3–12)
Neutro Abs: 5.8 10*3/uL (ref 1.7–7.7)
Neutrophils Relative %: 61 % (ref 43–77)
Neutrophils Relative %: 68 % (ref 43–77)

## 2010-07-10 LAB — COMPREHENSIVE METABOLIC PANEL
ALT: 20 U/L (ref 0–53)
AST: 26 U/L (ref 0–37)
Albumin: 3.6 g/dL (ref 3.5–5.2)
Alkaline Phosphatase: 62 U/L (ref 39–117)
BUN: 15 mg/dL (ref 6–23)
CO2: 24 mEq/L (ref 19–32)
Calcium: 9 mg/dL (ref 8.4–10.5)
Chloride: 103 mEq/L (ref 96–112)
Creatinine, Ser: 1.33 mg/dL (ref 0.4–1.5)
GFR calc Af Amer: >60 mL/min (ref >60)
GFR calc non Af Amer: 53 mL/min — ABNORMAL LOW (ref >60)
Glucose, Bld: 125 mg/dL — ABNORMAL HIGH (ref 70–99)
Potassium: 3.6 mEq/L (ref 3.5–5.1)
Sodium: 136 mEq/L (ref 135–145)
Total Bilirubin: 1.2 mg/dL (ref 0.3–1.2)
Total Protein: 7.1 g/dL (ref 6.0–8.3)

## 2010-07-10 LAB — CBC
HCT: 38.1 % — ABNORMAL LOW (ref 39.0–52.0)
HCT: 39.5 % (ref 39.0–52.0)
Hemoglobin: 12.9 g/dL — ABNORMAL LOW (ref 13.0–17.0)
Hemoglobin: 13.1 g/dL (ref 13.0–17.0)
MCHC: 32.6 g/dL (ref 30.0–36.0)
MCHC: 33.3 g/dL (ref 30.0–36.0)
MCHC: 33.4 g/dL (ref 30.0–36.0)
MCHC: 33.5 g/dL (ref 30.0–36.0)
MCHC: 33.8 g/dL (ref 30.0–36.0)
MCV: 95.8 fL (ref 78.0–100.0)
MCV: 96.6 fL (ref 78.0–100.0)
MCV: 96.7 fL (ref 78.0–100.0)
Platelets: 135 10*3/uL — ABNORMAL LOW (ref 150–400)
Platelets: 145 10*3/uL — ABNORMAL LOW (ref 150–400)
Platelets: 147 10*3/uL — ABNORMAL LOW (ref 150–400)
RBC: 3.94 MIL/uL — ABNORMAL LOW (ref 4.22–5.81)
RBC: 4.06 MIL/uL — ABNORMAL LOW (ref 4.22–5.81)
RBC: 4.08 MIL/uL — ABNORMAL LOW (ref 4.22–5.81)
RDW: 12.5 % (ref 11.5–15.5)
RDW: 12.7 % (ref 11.5–15.5)
RDW: 12.8 % (ref 11.5–15.5)
WBC: 6.7 10*3/uL (ref 4.0–10.5)
WBC: 8.5 10*3/uL (ref 4.0–10.5)

## 2010-07-10 LAB — STREP PNEUMONIAE URINARY ANTIGEN: Strep Pneumo Urinary Antigen: NEGATIVE

## 2010-07-10 LAB — BRAIN NATRIURETIC PEPTIDE: Pro B Natriuretic peptide (BNP): 290 pg/mL — ABNORMAL HIGH (ref 0.0–100.0)

## 2010-07-10 LAB — LIPID PANEL
Cholesterol: 100 mg/dL (ref 0–200)
HDL: 34 mg/dL — ABNORMAL LOW (ref >39)
LDL Cholesterol: 54 mg/dL (ref 0–99)
Total CHOL/HDL Ratio: 2.9 RATIO
VLDL: 12 mg/dL (ref 0–40)

## 2010-07-10 LAB — CK TOTAL AND CKMB (NOT AT ARMC)
CK, MB: 0.9 ng/mL (ref 0.3–4.0)
Relative Index: 0.7 (ref 0.0–2.5)
Total CK: 122 U/L (ref 7–232)

## 2010-07-10 LAB — URINE MICROSCOPIC-ADD ON

## 2010-07-10 LAB — CULTURE, BLOOD (ROUTINE X 2)

## 2010-07-27 ENCOUNTER — Other Ambulatory Visit: Payer: Self-pay | Admitting: *Deleted

## 2010-07-27 MED ORDER — POTASSIUM CHLORIDE 20 MEQ PO PACK: PACK | ORAL | Status: DC

## 2010-08-18 NOTE — Op Note (Signed)
NAME:  Timothy Bradford, Timothy Bradford                ACCOUNT NO.:  [id number]   MEDICAL RECORD NO.:  [id number]          PATIENT TYPE:  AMB   LOCATION:  [facility]                         FACILITY:  [facility]   PHYSICIAN:  Anselm Pancoast. Weatherly, M.D.DATE OF BIRTH:  [birth year]   DATE OF PROCEDURE:  09/20/2006  DATE OF DISCHARGE:                               OPERATIVE REPORT   PREOPERATIVE DIAGNOSIS:  Left inguinal hernia, probably direct.   POSTOPERATIVE DIAGNOSIS:  Left inguinal hernia with a pantaloon.   OPERATION:  Left inguinal herniorrhaphy with a mesh reinforcement.   ANESTHESIA:  Local with very heavy sedation.   SURGEON:  Anselm Pancoast. Zachery Dakins, MD.   ASSISTANT:  Nurse.   HISTORY:  [name] is a 75 year old male, who was referred to [place]  from the [facility] physicians where Mr. Winski is  managed.  He had actually been seen by Dr. Janee Morn in the [facility] ER with  the symptoms of a left inguinal hernia. The patient is on seizure  medications, but he has been well controlled.  He has had problems with  his prostate but had a TUR approximately a year-and-a-half ago and has  not had any problems with voiding since then.  I recommended that we  repair him as an outpatient and he is here for the planned procedure.  He was given a dose of Kefzol, taken back to the operative suite, we  elected to do him with an LOA tube.  The left groin was first clipped  and then prepped with Betadine solution and then the ilioinguinal nerve  area was anesthetized with a blunted 22-gauge needle, about 10 ml of the  mixture of 0.50% Xylocaine with Adrenaline and 0.25 of Marcaine.  The  skin was infiltrated with a mixture of the same solution and all total  about 38 ml of solution was used.  Sharp dissection down through the  skin layer of skin and thin layer of adipose tissue.  Two veins were  identified, clamped, divided, and ligated with #1 Vicryl, and the  external oblique aponeurosis was opened.  The cord  structures were  elevated and this was definitely a direct, but also an indirect  component coming down, and the hernia sac was separated from the cord  structures, opened, and then a high sac ligation was done with a #2-0  Surgilon and a second suture just passed and the hernia sac removed.  I  then repaired the floor with a modified Shouldice-type running up a #2-0  Prolene reinforcing the internal ring and then going back I then tied  the two ends together.  Next, a piece of Prolene mesh like a sail split  laterally was used to reinforce the floor.  The inferior limb was  sutured with a running #2-0 Prolene with two tails of suture around the  internal ring to reinforce it, and the ilioinguinal nerve was placed  with cord structures as it goes out through the ring.  The superior flap  was sutured out of the interrupted sutures of #2-0 Prolene and the mesh  is aligned not on excessive  tension, but snug.  The external oblique was  then closed with a #3-0 Vicryl, Scarpa's fascia was closed with  interrupted #3-0 Vicryl, and then a #4-0 Dexon subcuticular, and Benzoin  and Steri-Strips on the skin.  I had placed an additional Marcaine in  the floor where it was repaired and also where the high sac ligation had  been performed.  The patient's skin was closed with  Benzoin and Steri-Strips.  He tolerated the procedure nicely and was  breathing spontaneously and was sent to the recovery room in a stable  postop condition.  He will continue on all of his chronic medications  and will see [place] back in the office in approximately 10 days.           ______________________________  Anselm Pancoast. Zachery Dakins, M.D.     WJW/MEDQ  D:  09/20/2006  T:  09/20/2006  Job:  [id number]

## 2010-08-20 ENCOUNTER — Other Ambulatory Visit: Payer: Self-pay | Admitting: *Deleted

## 2010-08-20 MED ORDER — SIMVASTATIN 40 MG PO TABS: 40.0000 mg | ORAL_TABLET | Freq: Every day | ORAL | Status: DC

## 2010-08-21 NOTE — Discharge Summary (Signed)
NAME:  ANITA, MCADORY NO.:  [id number]   MEDICAL RECORD NO.:  [id number]          PATIENT TYPE:  INP   LOCATION:  3742                         FACILITY:  [facility]   PHYSICIAN:  Alvester Morin, M.D.  DATE OF BIRTH:  04-03-1936   DATE OF ADMISSION:  12/04/2004  DATE OF DISCHARGE:  12/09/2004                                 DISCHARGE SUMMARY   CONSULTATIONS:  Jamison Neighbor, M.D.   DISCHARGE DIAGNOSES:  1.  Urinary tract infection with hydronephrosis with positive culture to      enterobacter.  2.  Hypokalemia.  3.  History of hypertension.  4.  Anemia.  5.  Acute renal failure.   DISCHARGE MEDICATIONS:  1.  Levaquin 500 mg p.o. daily.  2.  Zocor 40 mg p.o. daily.  3.  Lasix 80 mg p.o. daily.  4.  Viagra 25 mg p.o. p.r.n.  5.  Magnesium oxide 400 mg daily.   The patient was discharged from telemetry unit with his fevers, chills,  diarrhea resolved, his appetite also improved upon discharge. At the time of  follow-up with Dr. Janee Morn of urgent care on Friday, September 8th at 11:45  a.m., he will need arrive at 11:15 for bloodwork at the clinic which  includes a BMET and magnesium level. He was also discharged with a Foley  catheter for his UTI/hydronephrosis and that will need be discontinued  during his visit with Dr. Logan Bores of urology. Dr. Logan Bores will also decide when  to discontinue the Levaquin 500 mg p.o. daily. Dr. Logan Bores had followup plans  to do a cystoscopy, folate studies, and post void residual checks. He will  also check a renal ultrasound to see if the ureters are decompressing. If  not, the patient will need additional studies such as CT scan or renal scan  to determine if obstruction is anything significant or if it is simply  hydronephrosis caused by some loss in the bladder outlet obstruction. The  patient may eventually require TURP to decompress his kidneys which would  allow additional retrograde studies to look at his kidneys to be done.  Abdominal CT without contrast done on bilateral hydronephrosis and  hydroureter, left lower lobe pneumonia, scattered hyperdensities throughout  the liver, and also small contracted bladder. Pelvis CT without contrast  done on December 05, 2004, showed bilateral hydroureter with moderately  thick walled bladder. No evidence for diverticulitis, pelvic free air, and  mild vascular calcification.  Digital chest x-ray, two view, showed scarring  of the lungs from previous infections, possibly tuberculosis. However, there  were no prior studies available for comparison. The patient also had urine  culture on December 04, 2004, that was positive for Enterobacter asburiae.  He also had an EKG done on December 05, 2004, showing sinus rhythm with  premature atrial complexes, left atrial enlargement, septal infarct with age  undetermined, with no significant changes since last EKG.   CONSULTATIONS:  Dr. Logan Bores of urology who saw the patient on December 08, 2004, who recommended that the patient be discharged with his Foley. He also  recommended that  the patient be discharged with Levaquin 500 mg p.o. daily.  He will follow up with Dr. Logan Bores on September 20th at 8:45 a.m. for further  workup of his bilateral nephrosis/UTI/Enterobacter positive urine culture.   BRIEF ADMISSION HISTORY:  The patient is a 75 year old African American male  with a past medical history of hypertension, one past seizure episode, lower  extremity edema, osteoarthritis, CHF, history of chest pain with five day  history of diarrhea, dizziness, stomach pain. During that time he was unable  to eat due to lack of appetite. He would have diarrhea after eating. He had  a fever of a reported 103.5 degrees the night prior to admission and there  is a question of a presyncopal episode according to the wife. Physical  examination was significant for a temperature of 102.4 in the emergency room  and he was found to be orthostatic. His  sitting blood pressure was 139/81,  heart rate 93 and lying down his blood pressure was 142/84 with pulse of 76,  and standing up it was 113/72 with heart rate of 93. He was found to have  crackles predominately in the lower bases on his respiratory exam. He also  had a slight bipedal edema in his lower extremities. In the ER, Chem-7  revealed sodium of 135, potassium 2.6, chloride 103, CO2 22, BUN 45,  creatinine 2.7, and glucose of 122. Anion gap was 10.  CBC revealed white  blood cell count of 7.3, hemoglobin 13.3, hematocrit 39, platelet count  169,000, MCV 88.8. Cardiac enzymes revealed creatine kinase of 6.4, troponin  less than 0.05, myoglobin less than 500, BNP 119.6. His coagulation studies  were negative. For his liver function tests, his bilirubin total is 0.7,  alkaline phosphatase 77, AST 19, ALT 11, total protein 8.2, albumin 4.3,  calcium 9.1.   HOSPITAL COURSE:   PROBLEM #1:  Acute renal failure.  His creatinine upon admission was found  to be 2.6.  However, after normal saline fluid resuscitation his creatinine  decreased to 1.2 at admission which is close to his baseline, with a  creatinine clearance of about 60 milliseconds per minute. His white blood  cell count on admission was 15.9, and at discharge dropped down to 11.8. He  was initially put on Cipro for positive urinalysis for UTI. However, when he  continued to have chills and he was found to have a rectal temperature of  105. His antibiotic coverage was broadened and he was given Primaxin 500 mg  IV q.8h. and also vancomycin IV.  At discharge the patient was doing well,  afebrile with no chills, good appetite.   PROBLEM #2:  UTI plus hydronephrosis with positive culture to enterobacter.  This could have been what was causing his nausea and vomiting. Initially  when he was admitted with chills on his second day of admission he was worked up for possible diverticulitis. However, the abdominal CT without   contrast showed an incidental finding of bilateral hydronephrosis. He also  has a positive enterobacter with longstanding history of increased urinary  frequency and hesitancy. He was consulted by Dr. Logan Bores of urology and he  felt comfortable for the patient to be discharged home with a Foley catheter  and Levaquin 500 mg p.o. daily. He will followed up on September 20th at  8:45, he will have a full workup. Dr. Logan Bores' consult is greatly appreciated.   PROBLEM #3:  Hypokalemia. He will resume his home medications of Kay Ciel 20  mEq  and restart Lasix 40 mg daily. His Lasix dose prior to admission was 80  mg p.o. b.i.d. We will wait until his hospital follow-up to begin titrating  up to his normal dose of Lasix.   PROBLEM #4:  History of hypertension. Given that the patient's systolic  blood pressure has been stable during his hospitalization with a systolic of  120 to 135 at discharge, we will wait until outpatient to follow up to  restart his medications. The patient's blood pressure during his  hospitalization has been stable without his blood pressure medications.   PROBLEM #5:  Anemia. On admission he had a hemoglobin of 10.0 with a  hemoglobin of 9.6 at discharge. We worked up his possible sources for his  anemia. His B12 levels were borderline and we measured his methylmalonic  acid level. However, the result was still pending at the time of discharge  and this will be followed up as an outpatient. His ferritin levels were also  found to be 16 and 14. His RBC folate level 445.   PROBLEM #6:  Elevated triglycerides. His lipid profile at the hospital  showed a triglyceride level of 364. He also had a VLDL level of 73. He was  discharged with his usual home dose of Zocor 40 mg p.o. daily upon  discharge. His lipids will need to be followed by his primary care Emanuele Mcwhirter.   PROBLEM #7:  Hypomagnesemia. His magnesium upon admission was found to be  1.1 and it increased to 1.3 at  discharge with replacement. He will need to  be followed up with his magnesium levels when he follows up with Dr.  Janee Morn at urgent care. We discharged him home on magnesium oxide 400 mg  daily.     ______________________________  Cottie Banda, A.I.    ______________________________  Alvester Morin, M.D.    RC/MEDQ  D:  12/09/2004  T:  12/09/2004  Job:  [id number]   cc:   Jamison Neighbor, M.D.  [address]  [place]  [place] [address]  Fax: [phone]   Dr. Janee Morn  Urgent Care

## 2010-08-21 NOTE — Consult Note (Signed)
NAME:  Timothy Bradford, Timothy Bradford NO.:  [id number]   MEDICAL RECORD NO.:  [id number]          PATIENT TYPE:  INP   LOCATION:  3742                         FACILITY:  [facility]   PHYSICIAN:  Jamison Neighbor, M.D.  DATE OF BIRTH:  [birth year]   DATE OF CONSULTATION:  12/08/2004  DATE OF DISCHARGE:                                   CONSULTATION   REFERRING PHYSICIAN:  Santiago Bumpers. Leveda Anna, M.D.   REASON FOR CONSULTATION:  1.  Pyelonephritis  2.  Urinary retention.   HISTORY OF PRESENT ILLNESS:  This 75 year old black male was admitted to the  hospital on December 04, 2004, for evaluation of fever, abdominal pain,  syncope, and a rise in his BUN and creatinine.  The patient had some chronic  abdominal pain that was somewhat vague and difficult to evaluate.  During  his evaluation, he was found to have evidence of an Enterobacter urinary  tract infection.  His antibiotic coverage was broadened and he almost  immediately felt better.  He was also noted during part of his evaluation to  have hydronephrosis.  This was despite the fact that a Foley catheter was in  place.  The patient did, however, have good renal function with normal BUN  and creatinine.  Urologic consultation was sought to determine if anything  needs to be done with the hydronephrosis prior to the patient's discharge.  The orders have been written that he can go home after he has been seen and  evaluated today.   The patient currently has a Foley catheter in place.  It is draining without  difficulty.  He is comfortable and feels that this is not bothering him.  He  does feel better and would actually like to go home.   He says he has not had previous urological problems.  He denies previous  problems with voiding.  He says that since the catheter went in, he does  feel a little bit better, but he claims that he has really not had any  significant urinary problems in the past.  He denies a previous episode of  retention.  He denies flank pain.  He denies stones, hematuria, or  documented urinary tract infection.  As noted above, the patient did have  evidence of both UTI and hydroureteronephrosis during this hospital stay  suggesting that he has had some problems prior to this admission.   PAST MEDICAL HISTORY:  Remarkable for hypertension and cardiac arrhythmia.  The patient has had seizures in the past.  He is known to have a history of  cataracts.   CURRENT MEDICATIONS:  1.  Protonix.  2.  Folic acid.  3.  Vitamin B1.  4.  Magnesium.  5.  Levaquin.  6.  Tylenol.   ADMISSION MEDICATIONS:  1.  Lasix.  2.  Zocor.  3.  Coreg.  4.  Lopressor.  5.  Potassium supplementation.  6.  Viagra on an intermittent basis.   SOCIAL HISTORY:  Pertinent for alcohol use in the past, but he does not use  any at this time.  He is  not a smoker.   PHYSICAL EXAMINATION:  GENERAL:  He is a well-developed, well-nourished male  in no acute distress.  HEENT:  Normocephalic and atraumatic.  The only finding was a little bit of  missing teeth.  NECK:  Supple with no adenopathy or thyromegaly and no frank mass or  tenderness.  ABDOMEN:  Soft and nontender with no palpable masses, rebound, or guarding.  GENITOURINARY:  Normal testicles bilaterally.  Penis is free of lesions.  Foley catheter is in place.  RECTAL:  Normal sphincter tone and a very small non-nodular prostate.  EXTREMITIES:  Significant peripheral edema, greater on the right than on the  left.   I think it is reasonable to send the patient home with a Foley catheter.  He  should follow up in our office.  I want to do a cystoscopy, flow rate  studies, and post void residual check.  I also will plan to check a renal  ultrasound to see if his ureters are decompressing.  If not, he will need  additional imaging studies such as a CT scan or renal scan to determine if  the obstruction is anything significant or if it is simply hydronephrosis   caused by some loss in the bladder outlet obstruction.  Going forward, I  think the patient may certainly require a TURP to decompress his kidney and  that would allow [place] to do additional retrograde studies to look at his  kidneys.           ______________________________  Jamison Neighbor, M.D.  Electronically Signed     RJE/MEDQ  D:  12/08/2004  T:  12/09/2004  Job:  [id number]   cc:   William A. Leveda Anna, M.D.  Fax: [phone]

## 2010-08-21 NOTE — Op Note (Signed)
NAME:  Timothy Bradford, Timothy Bradford NO.:  [id number]   MEDICAL RECORD NO.:  [id number]          PATIENT TYPE:  INP   LOCATION:  1428                         FACILITY:  [facility]   PHYSICIAN:  Jamison Neighbor, M.D.  DATE OF BIRTH:  11/16/1935   DATE OF PROCEDURE:  01/26/2005  DATE OF DISCHARGE:                                 OPERATIVE REPORT   PREOPERATIVE DIAGNOSIS:  Urinary retention.   POSTOPERATIVE DIAGNOSIS:  Urinary retention.   PROCEDURE:  Cystoscopy, transurethral resection of the prostate.   SURGEON:  Jamison Neighbor, M.D.   ANESTHESIA:  General.   COMPLICATIONS:  None.   DRAINS:  None.   BRIEF HISTORY:  This 75 year old male developed urinary retention in the  hospital.  The patient was sent home on medication and then came back for a  voiding trial, which he failed.  He is now to undergo TURP.  Patient  understands that there are other options, but certainly the best guarantee  to make him urinate is the TURP.  He gave full informed consent.   PROCEDURE:  After successful induction of spinal anesthesia, the patient is  placed in a dorsal lithotomy position, prepped with Betadine, and draped in  the usual sterile fashion.  The urethra was dilated up to a 30 [place] with  [employer].  The Olympus continuous resectoscope sheath was then  inserted using a Timberlake obturator.  The Stern-McCarthy resectoscope was  inserted with a 12 degree lens in place.  The bladder was carefully  inspected.  No tumors or stones could be seen.  Trabeculation was  identified.  The ureters were well back from the anterolateral resection.  The prostate was enlarged with lateral lobe hypertrophy but not much median  lobe hypertrophy.  The patient then underwent resection, beginning along the  floor of the prostate, which was resected back to the verumontanum.  The  right lateral lobe was resected, starting at the 11 o'clock position and  extending down to the floor of the  prostate.  The left lateral lobe was  resected in an identical fashion.  The resection was taken down to the  surgical capsule, down as far as the verumontanum.  There was a little more  tissue on the left than on the right but no real difference in consistency  or appearance.  A small amount of apical tissue was resected, along with  some at the 12 on-call physician.  All chips were irrigated from the  bladder.  Additional hemostasis was obtained.  Final inspection showed a  wide-open prostatic fossa with no undermining of the bladder neck, no injury  to the ureters, and no injury to the sphincter mechanism or verumontanum.  The resectoscope was removed.  There was good flow with Crede maneuver,  which would indicate the patient will be able to urinate if he can generate  normal detrusor contraction.  If it turns out the patient has long term  problems not responsive to bladder stimulation or alpha blockade, he will be  given the opportunity to have a suprapubic tube.  It was  felt, based on  today's appearance, that  he was wide enough open and has no real history of neurogenic bladder to  suggest that he would not be able to urinate, so we elected not to place a  suprapubic tube.  The patient tolerated the procedure well and was taken to  the recovery room in good condition.           ______________________________  Jamison Neighbor, M.D.  Electronically Signed     RJE/MEDQ  D:  01/26/2005  T:  01/26/2005  Job:  [id number]   cc:   William A. Leveda Anna, M.D.  Fax: [phone]

## 2010-08-21 NOTE — Cardiovascular Report (Signed)
NAME:  Timothy Bradford, Timothy Bradford                            ACCOUNT NO.:  [id number]   MEDICAL RECORD NO.:  [id number]                   PATIENT TYPE:  INP   LOCATION:  3742                                 FACILITY:  [facility]   PHYSICIAN:  Mohan N. Sharyn Lull, M.D.              DATE OF BIRTH:  04/14/1935   DATE OF PROCEDURE:  11/13/2002  DATE OF DISCHARGE:                              CARDIAC CATHETERIZATION   PROCEDURE:  Left cardiac catheterization with selective left and right  coronary angiography and left ventriculography via the right groin using  Judkins technique.   INDICATIONS:  Mr. Gladman is a 75 year old black male with past medical  history significant for hypertension, history of congestive heart failure,  seizure disorder,  history of alcohol abuse and remote history of tobacco  abuse.  He came to the emergency room  complaining of retrosternal and left-  sided chest pain radiating to the left shoulder, neck, and left arm off and  on since Friday evening.  On Saturday early morning woke up with similar  pain associated with mild diaphoresis, so decided to come to the emergency  room.  States also chest pain increases with movement.  Denies any nausea,  vomiting or shortness of breath.  Denies palpitation, lightheadedness or  syncope.  The patient gives history of exertional dyspnea feeling weak and  tired.  Also complains of orthopnea and occasional leg swelling.  Denies any  cardiac workup in the past.  Denies cough, fever, chills.  Denies GI  symptoms.   PAST MEDICAL HISTORY:  As above.   PAST SURGICAL HISTORY:  He had cataract surgery in the past.   ALLERGIES:  No known drug allergies.   MEDICATIONS:  1. Lasix 40 mg p.o. daily.  2. Procardia 30 mg p.o. daily.  3. Klor-Con 20 mg p.o. daily.  4. Vioxx 25 mg p.o. daily.   SOCIAL HISTORY:  He is retired on disability.  Married with five children.  Smoked 1 1/2 packs per day for 30+ years and quit in 1980.  He used to drink  heavy from age 23 to 65, for approximately 45 years.  Quit in 1999.  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  Presently, he  is on disability.   FAMILY HISTORY:  Negative for coronary artery disease.   PHYSICAL EXAMINATION:  GENERAL:  He was alert, awake, oriented x3 in no  acute distress.  VITAL SIGNS:  Hemodynamically stable.  Sinus rhythm on the monitor.  HEENT:  Conjunctivae pink.  NECK:  Supple.  No jugular venous distention.  No bruit.  LUNGS:  He has decreased breath sounds with occasional faint rales at the  bases.  CARDIOVASCULAR:  S1, S2 was soft.  There was soft S3 gallop.  There was no  murmur or rub.  ABDOMEN:  Soft bowel sounds are present.  Nontender.  EXTREMITIES:  No clubbing,  cyanosis.  There was trace edema.   LABORATORY DATA:  His EKG showed normal sinus rhythm with first-degree AV  block, septal myocardial infarction age undetermined.  His CPKs and  troponins are negative.   BRIEF HOSPITAL COURSE:  The patient was admitted to telemetry unit.  Myocardial infarction was ruled out by serial enzymes and EKG.  Due to  multiple risk factors, abnormal EKG, exertional dyspnea and atypical chest  pain, discussed with patient regarding noninvasive stress test versus left  cath, possible percutaneous transluminal coronary angioplasty and stenting  its risks and benefits.  The patient consented for invasive PCI.   PROCEDURE:  After obtaining informed consent, the patient was brought to the  cath lab and was placed on fluoroscopy table. Right groin was prepped and  draped in usual fashion.  2% Xylocaine was used for local anesthesia in the  right groin.  With the help of thin-wall needle, a 6-French arterial sheath  was placed.  The sheath was aspirated and flushed.  Next, 6-French left  Judkins catheter was advanced over the wire under fluoroscopic guidance up  to the ascending aorta.  Wire was pulled out and the catheter was aspirated  and  connected to the manifold. Catheter was further advanced and engaged  into left coronary ostium.  Multiple views of the left system were taken.  Next, the catheter was disengaged and was pulled out over the wire and was  replaced with 6-French right Judkins catheter which was advanced over the  wire under fluoroscopic guidance to the ascending aorta.  Wire was pulled  out, the catheter was aspirated and connected to the manifold.  Catheter was  further advanced and engaged into right coronary ostium.  Multiple views of  the right system were taken.  Next, the catheter was disengaged and was  pulled out over the wire and was replaced with 6-French pigtail catheter  which was advanced over wire under fluoroscopic guidance up to the ascending  aorta.  Wire was pulled out, the catheter was aspirated and connected to the  manifold.  Catheter was further advanced across the aortic valve into the  left ventricle.  Left ventricular pressures were recorded.  Next, left  ventriculography was done in 30-degree RAO position.  Post angiographic  pressures were recorded from LV and then pullback pressures were recorded  from the aorta.  There was no gradient across the aortic valve.  Next, the  pigtail catheter was pulled out over the wire, sheaths were aspirated and  flushed.   FINDINGS:  The LV showed mild global hypokinesia.  EF of 45-50%.  LVEDP was  12 mmHg.  Left main was long which was patent.  LAD has 10% distal stenosis.  The vessel proximally was very tortuous.  Diagonal one was small which was  patent.  Left circumflex was patent proximally and in mid portion and then  tapers down in AV groove after giving off OM-2.  OM-1 was large which was  patent.  OM-2 was small which was patent.  Dara Lords was patent.  Arteriotomy  was closed with Perclose without complications.  The patient tolerated the procedure well.  The patient was transferred to recovery room in stable  condition.  Eduardo Osier. Sharyn Lull, M.D.    MNH/MEDQ  D:  11/13/2002  T:  11/13/2002  Job:  [id number]   cc:   C. Ulyess Mort, M.D.  [address]  [place]  [place] [address]  Fax: [phone]

## 2010-08-21 NOTE — Discharge Summary (Signed)
NAME:  Timothy, Bradford NO.:  [id number]   MEDICAL RECORD NO.:  [id number]                   PATIENT TYPE:  INP   LOCATION:  3742                                 FACILITY:  [facility]   PHYSICIAN:  Ezzard Standing, M.D.                 DATE OF BIRTH:  05/29/1935   DATE OF ADMISSION:  11/10/2002  DATE OF DISCHARGE:  11/14/2002                                 DISCHARGE SUMMARY   DISCHARGE DIAGNOSES:  1. Chest pain likely due to musculoskeletal strain.  2. Hypertension, benign.  3. Arthritis.  4. Congestive heart failure.  5. History of alcohol abuse, quit in 1999.  6. History of delirium tremens with alcohol abuse, last episode in 1999.   DISCHARGE MEDICATIONS:  1. Lisinopril 10 mg one p.o. daily.  2. Zocor 40 mg one p.o. daily.  3. Aspirin 81 mg one p.o. daily.  4. Vioxx one p.o. daily.  5. Metoprolol 25 mg one p.o. b.i.d.   DISPOSITION:  Improved.  The patient is to follow up at [facility] on  August 26th at 12:10 for an appointment.   PROCEDURES PERFORMED:  1. 2-D echocardiogram revealing overall left ventricular systolic function     was mildly decreased with ventricular ejection fraction estimated to be     45-50% with mild diffuse left ventricular hypokinesis and possible     moderate hypokinesis of the basal septal wall.  2. Portable chest x-ray showing cardiac enlargement with mild pulmonary     edema and small pleural effusions, bibasilar atelectasis and bulbous     changes in the apices.  3. Chest CT showing no evidence of acute pulmonary embolism, borderline     mediastinal adenopathy, bilateral collapse/consolidating change left     greater than right with small left pleural effusion, marked emphysema,     and a 3 cm enhancing lesion in the left liver.  4. An abdominal MRI to rule out malignant lesion of the liver.  Findings     consistent with hemangioma.   CONSULTATIONS:  Include Mohan N. Sharyn Lull, M.D.   ADMISSION HISTORY AND  PHYSICAL EXAMINATION:  Timothy Bradford is a 75 year old  African-American male with a history of congestive heart failure, back pain,  hypertension, arthritis, and a history of alcohol abuse who is here for a  two-day complaint of chest pain.  Events began one day prior to admission  when the evening before the patient was mowing his yard and noticed some  pain in the left arm that felt like pressure lasting 15-20 minutes.  The  pain resolved without any intervention and was fine until the day of  admission when the patient noticed that the pain started again in his neck  and this time coursed downwards towards his ribs although not really  occurring in the chest or arms.  This pain occurred while he was sitting  resting comfortably in his  chair.  The pain continued for several hours  until the patient decided to come to the emergency department.  The patient  felt as though his symptoms were consistent with indigestion although he has  not had a history of reflux.  He denies any similar priors, no shortness of  breath, no nausea and vomiting at the time of the pain.  He describes the  pain as a 10 out of 10 and reproducible with palpation and movement.  He  also noticed that the pain was reproduced with deep inspirations and he was  positive for diaphoresis.   ALLERGIES:  No known drug allergies.   PAST MEDICAL HISTORY:  As listed in the HPI.   SOCIAL HISTORY:  Includes a former smoker of 1-/1/2 packs per day x20 years.  He quit 20 years ago.  He is married.  He has a seventh grade education  level.  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  He is currently seeking disability secondary to back injury and he  lives with his wife in [place].   REVIEW OF SYSTEMS:  Positive for chest pain as mentioned before, cough,  urinary frequency secondary to Lasix treatment, joint pain and back pain.   PHYSICAL EXAMINATION:  VITAL SIGNS:  Includes a temperature of 97,  pulse is  60, respiratory rate of 18, blood pressure 141/80. An oxygen saturation of  96% on room air.  GENERAL:  He is a well-developed, well-nourished male lying in bed in no  apparent distress.  HEENT:  Eyes reveal pupils equally round and reactive to light and  accommodation.  No AV nicking, disks sharp.  NECK:  Supple, positive right carotid bruit.  RESPIRATORY:  Clear to auscultation bilaterally.  Decreased air movement.  Bilateral basilar crackles, right greater than left.  CARDIOVASCULAR:  Regular rate and rhythm with some ectopy.  No murmurs, no  rubs, no gallops.  GASTROINTESTINAL:  Positive bowel sounds, nontender in all quadrants.  No  suprapubic pain.  EXTREMITIES:  Had 2+ pedal pulses, nonpitting edema in the lower  extremities.  SKIN:  Showed multiple seborrheic keratosis on the chest and back.  NEUROLOGICAL:  Showed cranial nerves II-XII grossly intact.  PSYCHIATRIC:  Revealed he was appropriate and full affect.   ADMISSION LABORATORY DATA:  Included cardiac enzymes with a myoglobin  reported in order of 112, 99.7, and 104.  A CK-MB reported in order of times  of 1.7, 1.2, and 1.1 and a troponin all three reported in order of times of  less than 0.05.  His sodium was 140, his potassium 3.5, his chloride was  106, his CO2 was 27, his BUN was 13, his creatinine was 1.1, his glucose was  103.  His ALP was 80, his AST was 23, his ALT was 20, his protein was 8.1,  albumin 4.2, calcium 9.5.  His EKG revealed normal sinus rhythm with first-  degree AV block and occasional PVCs.  He had poor R-wave progressions in V1  and V2 consistent with a septal infarct.  No ST changes, no T-wave  inversions.  His chest x-ray showed as revealed above bilateral atelectasis,  cardiomegaly, and perihilar edema.   HOSPITAL COURSE:  Problem 1.  CHEST PAIN:  Although the cardiac enzymes were normal, the patient had risk factors for CAD including BML, tobacco abuse,  and hypertension.  He was  admitted for rule out of myocardial infarction.  Other causes of chest pain were  also ruled out.  His chest x-ray showed no  evidence of pneumonia.  Pulmonary embolism was ruled out with a chest CT.  EKG on admit showed a first AV block, with occasional PVCs and no ST  elevations and no T-wave inversions.  Because he had a complaint of chest  pain with abnormal EKG findings, cardiologist Eduardo Osier. Sharyn Lull, M.D. was  consulted.  Dr. Sharyn Lull recommended a 2-D echo and invasive cath procedure.  2-D echo was performed and showed an ejection fraction of 45-50%, mild  hypokinesis left ventricular wall, and moderate hypokinesis of the basal  septal wall.  Cath results included 10% distal stenosis of the LAD, mild  global hypokinesis of the left ventricle, ejection fraction of 45-50%.  Also  the patient was noted to have hyperlipidemia on lipid profile studies and  was started on Zocor for hyperlipidemia.  The patient was also started on  prophylactic dose of aspirin 81 mg daily.   Problem 2.  CONGESTIVE HEART FAILURE:  Because of evidence of congestive  heart failure it was felt that the patient would benefit from an ACE  inhibitor and a beta-blocker.  Therefore, metoprolol and lisinopril was  started while Lasix and Procardia were discontinued.  Creatinine was 1.1  during hospitalization.  In the future, the patient will require a  creatinine and potassium check since the ACE inhibitor was started.  Because  of his new medication including the ACE inhibitor, his daily dose of Klor-  Con potassium supplementation 20 mEq was discontinued as well.   Problem 3.  HYPERTENSION:  The patient was adequately controlled with his  new medical regimen during his hospitalization.   Problem 4.  ARTHRITIS:  The patient was continued on Vioxx and had no  additional complaints.   Problem 5.  THREE CENTIMETER LIVER NODULE FOUND ON CHEST CT:  The patient  was sent for an abdominal MRI for further evaluation of  coincidental liver  finding on chest CT and findings were consistent with a hemangioma.  No  further intervention was required.   DISCHARGE LABORATORY DATA:  Include white blood cell count of 7.6,  hemoglobin of 13.6, hematocrit 40.5, platelets of 188,000.  A sodium of 140,  potassium was 4.3, chloride of 107, CO2 26, glucose 109, BUN 12, creatinine  1, calcium was 9.3.   FOLLOWUP:  The patient is to follow up with [facility] on August 26th at  12:10.  Recommend followup in one to two months with a BMET to monitor  creatinine and potassium given new medications of ACE inhibitor.      Foye Clock, MD                         Ezzard Standing, M.D.    JH/MEDQ  D:  11/18/2002  T:  11/18/2002  Job:  [id number]   cc:   Eduardo Osier. Sharyn Lull, M.D.  [address]  [place]  [place] [address]  Fax: [phone]   [facility]

## 2010-08-21 NOTE — H&P (Signed)
NAME:  ADLAI, SINNING NO.:  [id number]   MEDICAL RECORD NO.:  [id number]          PATIENT TYPE:  INP   LOCATION:  0008                         FACILITY:  [facility]   PHYSICIAN:  Jamison Neighbor, M.D.  DATE OF BIRTH:  [birth year]   DATE OF ADMISSION:  01/26/2005  DATE OF DISCHARGE:                                HISTORY & PHYSICAL   ADMISSION DIAGNOSIS:  Urinary retention secondary to bladder outlet  obstruction.   HISTORY:  This 75 year old male was in the hospital at [facility] when he  was found to be in urinary retention.  He failed a balloon trial and now  will be admitted following TURP.  The patient has a history of mild to  moderate bladder outlet obstruction for some time and had not been evaluated  with any previous urologic evaluation.  The patient had never taken alpha  blockade or other medication.  Since he has had complete retention, failed a  short course of medication, it was felt he should be admitted for definitive  repair.   The patient is known to have hypertension as well as congestive heart  failure.   MEDICATIONS:  1.  Lasix 40 mg in the morning.  2.  Metoprolol 25 mg twice daily.  3.  Simvastatin 40 mg in the morning.  4.  Potassium supplementation 20 mEq.  5.  Magnesium 1 tablet daily.   PAST MEDICAL HISTORY:  Otherwise unremarkable.  He has had no previous  surgery.  He has no other chronic medical illness.  The patient's only  hospitalization was this one in September when he had a urinary tract  infection and renal failure secondary to the retention.   SOCIAL HISTORY:  The patient did abuse alcohol in the past but has not had  some since 1999.  He has had no tobacco for 15 years.   REVIEW OF SYSTEMS:  Otherwise noncontributory.   PHYSICAL EXAMINATION:  VITAL SIGNS:  Today, temperature 98.1, pulse 62,  respiratory rate 14, blood pressure 132/82.  GENERAL:  Well-developed, well-nourished male in no acute distress.  HEENT:   Normocephalic and atraumatic.  Pharynx and throat grossly intact.  NECK:  Supple with no adenopathy or thyromegaly.  LUNGS: Clear.  HEART: Regular rate and rhythm with no murmurs, thrills, gallops, rubs, or  heaves.  ABDOMEN:  Soft, nontender, with no palpable masses, rebound, or guarding.  GU: Normal testicles bilaterally.  The pelvis is free of any lesions.  There  is no pain, disease, or abnormality in the meatus.  RECTAL: 2 to 3+ benign-feeling prostate, smooth, and not nodular.  EXTREMITIES:  No cyanosis, clubbing, or edema.  NEUROLOGIC:  Intact.  VASCULAR:  Intact.   IMPRESSION:  Benign prostatic hypertrophy with bladder outlet obstruction.   PLAN:  TURP.           ______________________________  Jamison Neighbor, M.D.  Electronically Signed     RJE/MEDQ  D:  01/26/2005  T:  01/26/2005  Job:  [id number]   cc:   William A. Leveda Anna, M.D.  Fax: [phone]

## 2010-09-02 ENCOUNTER — Other Ambulatory Visit: Payer: Self-pay | Admitting: *Deleted

## 2010-09-03 MED ORDER — METOPROLOL TARTRATE 25 MG PO TABS: 25.0000 mg | ORAL_TABLET | Freq: Two times a day (BID) | ORAL | Status: DC

## 2010-09-22 ENCOUNTER — Ambulatory Visit (INDEPENDENT_AMBULATORY_CARE_PROVIDER_SITE_OTHER): Payer: Medicare Other | Admitting: Internal Medicine

## 2010-09-22 ENCOUNTER — Encounter: Payer: Self-pay | Admitting: Internal Medicine

## 2010-09-22 VITALS — BP 139/82 | HR 69 | Temp 98.3°F | Ht 72.0 in | Wt 179.3 lb

## 2010-09-22 DIAGNOSIS — E785 Hyperlipidemia, unspecified: Secondary | ICD-10-CM

## 2010-09-22 DIAGNOSIS — R21 Rash and other nonspecific skin eruption: Secondary | ICD-10-CM

## 2010-09-22 DIAGNOSIS — T502X5A Adverse effect of carbonic-anhydrase inhibitors, benzothiadiazides and other diuretics, initial encounter: Secondary | ICD-10-CM

## 2010-09-22 DIAGNOSIS — I10 Essential (primary) hypertension: Secondary | ICD-10-CM

## 2010-09-22 LAB — CBC WITH DIFFERENTIAL/PLATELET
Basophils Absolute: 0 10*3/uL (ref 0.0–0.1)
Basophils Relative: 0 % (ref 0–1)
Eosinophils Absolute: 0.2 10*3/uL (ref 0.0–0.7)
Lymphs Abs: 1.8 10*3/uL (ref 0.7–4.0)
MCH: 31.6 pg (ref 26.0–34.0)
Neutrophils Relative %: 49 % (ref 43–77)
Platelets: 181 10*3/uL (ref 150–400)
RBC: 4.11 MIL/uL — ABNORMAL LOW (ref 4.22–5.81)

## 2010-09-22 LAB — COMPREHENSIVE METABOLIC PANEL
Albumin: 4.5 g/dL (ref 3.5–5.2)
Alkaline Phosphatase: 58 U/L (ref 39–117)
CO2: 25 mEq/L (ref 19–32)
Glucose, Bld: 102 mg/dL — ABNORMAL HIGH (ref 70–99)
Potassium: 4.2 mEq/L (ref 3.5–5.3)
Sodium: 140 mEq/L (ref 135–145)
Total Protein: 7.3 g/dL (ref 6.0–8.3)

## 2010-09-22 MED ORDER — SIMVASTATIN 40 MG PO TABS: 40.0000 mg | ORAL_TABLET | Freq: Every day | ORAL | Status: DC

## 2010-09-22 MED ORDER — METOPROLOL TARTRATE 25 MG PO TABS: 25.0000 mg | ORAL_TABLET | Freq: Two times a day (BID) | ORAL | Status: DC

## 2010-09-22 MED ORDER — MUPIROCIN CALCIUM 2 % EX CREA: TOPICAL_CREAM | CUTANEOUS | Status: DC

## 2010-09-22 MED ORDER — SODIUM HYPOCHLORITE 12 % SOLN: 1.0000 | Freq: Every day | Status: DC

## 2010-09-22 MED ORDER — POTASSIUM CHLORIDE 20 MEQ PO PACK: PACK | ORAL | Status: DC

## 2010-09-22 MED ORDER — FUROSEMIDE 40 MG PO TABS: 40.0000 mg | ORAL_TABLET | Freq: Every day | ORAL | Status: DC

## 2010-09-22 NOTE — Assessment & Plan Note (Signed)
Controlled as off lipid profile in 2010. Patient is not fasting today. check lipid profile at next visit. Continue statin.

## 2010-09-22 NOTE — Assessment & Plan Note (Signed)
No change Well controlled 

## 2010-09-22 NOTE — Patient Instructions (Signed)
Follow up in 1 year.

## 2010-09-22 NOTE — Assessment & Plan Note (Signed)
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 Some of this nevi have  superficial infections which I will treat with topical antibiotic and Hibiclens bath Was also referred to dermatologist for further assessment and management.

## 2010-09-22 NOTE — Progress Notes (Signed)
75 year old man with a history of hypertension, hyperlipidemia, congestive heart failure with an EF of 45-50% as of 2004 comes to the clinic for followup. Complaining of rash all over his body.  Rash: Described as Big black moles all over his body. Present since birth. Increasing in size recently. Some of them have started bleeding. No change in color. No pain or itching. Multiple members in the family have it including his children. Never seen a doctor for this complaint before.  No other complaints. Compliant with his medications.  Constitutional: Denies fever, chills, diaphoresis, appetite change and fatigue.  HEENT: Denies photophobia, eye pain, redness, hearing loss, ear pain, congestion, sore throat, rhinorrhea, sneezing, mouth sores, trouble swallowing, neck pain, neck stiffness and tinnitus.   Respiratory: Denies SOB, DOE, cough, chest tightness,  and wheezing.   Cardiovascular: Denies chest pain, palpitations and leg swelling.  Gastrointestinal: Denies nausea, vomiting, abdominal pain, diarrhea, constipation, blood in stool and abdominal distention.  Genitourinary: Denies dysuria, urgency, frequency, hematuria, flank pain and difficulty urinating.  Musculoskeletal: Denies myalgias, back pain, joint swelling, arthralgias and gait problem.  Skin: Denies pallor, has rash a Neurological: Denies dizziness, seizures, syncope, weakness, light-headedness, numbness and headaches.  Hematological: Denies adenopathy. Easy bruising, personal or family bleeding history  Psychiatric/Behavioral: Denies suicidal ideation, mood changes, confusion, nervousness, sleep disturbance and agitation  BP 139/82  Pulse 69  Temp(Src) 98.3 F (36.8 C) (Oral)  Ht 6' (1.829 m)  Wt 179 lb 4.8 oz (81.33 kg)  BMI 24.32 kg/m2  General Appearance:    Alert, cooperative, no distress, appears stated age  Head:    Normocephalic, without obvious abnormality, atraumatic  Throat:   Lips, mucosa, and tongue normal; teeth  and gums normal  Neck:   Supple, symmetrical, trachea midline, no adenopathy;       thyroid:  No enlargement/tenderness/nodules; no carotid   bruit or JVD  Back:     Symmetric, no curvature, ROM normal, no CVA tenderness  Lungs:     Clear to auscultation bilaterally, respirations unlabored  Chest wall:    No tenderness or deformity  Heart:    Regular rate and rhythm, S1 and S2 normal, no murmur, rub   or gallop  Abdomen:     Soft, non-tender, bowel sounds active all four quadrants,    no masses, no organomegaly  Extremities:   Extremities normal, atraumatic, no cyanosis or edema  Pulses:   2+ and symmetric all extremities  Skin:  dark hyperpigmented papules of varying sizes throughout his body most noticeable on torso and chest wall. Some of them seem to have mild superficial infection with bleeding.   Lymph nodes:   Cervical, supraclavicular, and axillary nodes normal  Neurologic:   CNII-XII intact. Normal strength, sensation and reflexes      throughout

## 2010-10-12 ENCOUNTER — Encounter: Payer: Self-pay | Admitting: Internal Medicine

## 2011-01-20 LAB — DIFFERENTIAL
Eosinophils Relative: 3
Lymphocytes Relative: 42
Lymphs Abs: 2.3
Monocytes Absolute: 0.6
Monocytes Relative: 10

## 2011-01-20 LAB — COMPREHENSIVE METABOLIC PANEL
AST: 23
Albumin: 3.8
Calcium: 9.3
Chloride: 108
Creatinine, Ser: 1.05
GFR calc Af Amer: >60
Total Protein: 7.2

## 2011-01-20 LAB — CBC
HCT: 39.5
MCHC: 33.5
MCV: 92.6
RBC: 4.27

## 2011-04-27 ENCOUNTER — Encounter: Payer: Medicare Other | Admitting: Internal Medicine

## 2011-05-12 ENCOUNTER — Ambulatory Visit (INDEPENDENT_AMBULATORY_CARE_PROVIDER_SITE_OTHER): Payer: Medicare Other | Admitting: Internal Medicine

## 2011-05-12 ENCOUNTER — Other Ambulatory Visit: Payer: Self-pay | Admitting: Internal Medicine

## 2011-05-12 DIAGNOSIS — E785 Hyperlipidemia, unspecified: Secondary | ICD-10-CM

## 2011-05-12 DIAGNOSIS — I10 Essential (primary) hypertension: Secondary | ICD-10-CM

## 2011-05-12 DIAGNOSIS — I509 Heart failure, unspecified: Secondary | ICD-10-CM

## 2011-05-12 LAB — COMPREHENSIVE METABOLIC PANEL
ALT: 15 U/L (ref 0–53)
AST: 22 U/L (ref 0–37)
Albumin: 4.4 g/dL (ref 3.5–5.2)
CO2: 24 mEq/L (ref 19–32)
Calcium: 9.4 mg/dL (ref 8.4–10.5)
Chloride: 106 mEq/L (ref 96–112)
Creat: 1.11 mg/dL (ref 0.50–1.35)
Glucose, Bld: 93 mg/dL (ref 70–99)
Total Bilirubin: 0.5 mg/dL (ref 0.3–1.2)

## 2011-05-12 LAB — LIPID PANEL
Cholesterol: 120 mg/dL (ref 0–200)
Total CHOL/HDL Ratio: 3.2 Ratio
Triglycerides: 108 mg/dL (ref <150)
VLDL: 22 mg/dL (ref 0–40)

## 2011-05-12 MED ORDER — FUROSEMIDE 40 MG PO TABS: 40.0000 mg | ORAL_TABLET | Freq: Every day | ORAL | Status: DC

## 2011-05-12 MED ORDER — ASPIRIN EC 81 MG PO TBEC: 81.0000 mg | DELAYED_RELEASE_TABLET | Freq: Every day | ORAL | Status: AC

## 2011-05-12 MED ORDER — METOPROLOL TARTRATE 25 MG PO TABS: 25.0000 mg | ORAL_TABLET | Freq: Two times a day (BID) | ORAL | Status: DC

## 2011-05-12 MED ORDER — SIMVASTATIN 40 MG PO TABS: 40.0000 mg | ORAL_TABLET | Freq: Every day | ORAL | Status: DC

## 2011-05-12 NOTE — Patient Instructions (Signed)
Followup in 6-8 months. Bring all her medications at that time. Check her blood pressure at least once a month at a local pharmacy. Call the clinic if the upper number is more than 150 for 2-3 months

## 2011-05-12 NOTE — Assessment & Plan Note (Addendum)
Check lipid profile and LFTs today. Continue Zocor 40 mg daily.

## 2011-05-12 NOTE — Progress Notes (Signed)
Patient ID: Timothy Bradford, male   DOB: 1935-11-01, 76 y.o.   MRN: [id number]  76 year old man with past medical history of hypertension, hyperlipidemia and congestive heart failure with an EF of 45-50% as of 2004 comes to the clinic for followup. C/o occasional dizziness on getting up, occurs once in a while, not commonly, no falls or passing out Compliant with his medications. She assessment and plan for detailed history of present illness.  Physical exam   General Appearance:     Filed Vitals:   05/12/11 1524  BP: 138/80  Pulse: 70  Temp: 97.2 F (36.2 C)  TempSrc: Oral  Height: 6' (1.829 m)  Weight: 183 lb 14.4 oz (83.416 kg)  SpO2: 100%     Alert, cooperative, no distress, appears stated age  Head:    Normocephalic, without obvious abnormality, atraumatic  Eyes:    PERRL, conjunctiva/corneas clear, EOM's intact, fundi    benign, both eyes       Neck:   Supple, symmetrical, trachea midline, no adenopathy;       thyroid:  No enlargement/tenderness/nodules; no carotid   bruit or JVD  Lungs:     Clear to auscultation bilaterally, respirations unlabored  Chest wall:    No tenderness or deformity  Heart:    Irregularly irregular rhythm, S1 and S2 normal, no murmur, rub or gallop  Abdomen:     Soft, non-tender, bowel sounds active all four quadrants,    no masses, no organomegaly  Extremities:   Extremities normal, atraumatic, no cyanosis or edema  Pulses:   2+ and symmetric all extremities  Skin:   Skin color, texture, turgor normal, no rashes or lesions  Neurologic:  nonfocal grossly    Review of systems  Constitutional: Denies fever, chills, diaphoresis, appetite change and fatigue.  HEENT: Denies photophobia, eye pain, redness, hearing loss, ear pain, congestion, sore throat, rhinorrhea, sneezing, mouth sores, trouble swallowing, neck pain, neck stiffness and tinnitus.   Respiratory: Denies SOB, DOE, cough, chest tightness,  and wheezing.   Cardiovascular: Denies chest pain,  palpitations and leg swelling.  Gastrointestinal: Denies nausea, vomiting, abdominal pain, diarrhea, constipation, blood in stool and abdominal distention.  Genitourinary: Denies dysuria, urgency, frequency, hematuria, flank pain and difficulty urinating.  Musculoskeletal: Denies myalgias, back pain, joint swelling, arthralgias and gait problem.  Skin: Denies pallor, rash and wound.  Neurological: Denies dizziness, seizures, syncope, weakness, light-headedness, numbness and headaches.  Hematological: Denies adenopathy. Easy bruising, personal or family bleeding history  Psychiatric/Behavioral: Denies suicidal ideation, mood changes, confusion, nervousness, sleep disturbance and agitation

## 2011-05-12 NOTE — Assessment & Plan Note (Addendum)
No complaints of worsening CHF. No change in management. Aggressive control of blood pressure. Continue Lasix.

## 2011-05-12 NOTE — Assessment & Plan Note (Addendum)
Continue Lopressor at current dose and Lasix. Check serum chemistry today

## 2011-09-10 ENCOUNTER — Other Ambulatory Visit: Payer: Self-pay | Admitting: *Deleted

## 2011-09-10 MED ORDER — POTASSIUM CHLORIDE 20 MEQ PO PACK: PACK | ORAL | Status: DC

## 2012-01-18 ENCOUNTER — Ambulatory Visit: Payer: Medicare Other

## 2012-01-18 ENCOUNTER — Ambulatory Visit (INDEPENDENT_AMBULATORY_CARE_PROVIDER_SITE_OTHER): Payer: Medicare Other

## 2012-01-18 DIAGNOSIS — Z23 Encounter for immunization: Secondary | ICD-10-CM

## 2012-03-16 ENCOUNTER — Ambulatory Visit (INDEPENDENT_AMBULATORY_CARE_PROVIDER_SITE_OTHER): Payer: Medicare Other | Admitting: Internal Medicine

## 2012-03-16 ENCOUNTER — Encounter: Payer: Self-pay | Admitting: Internal Medicine

## 2012-03-16 VITALS — BP 141/83 | HR 68 | Temp 97.5°F | Ht 74.0 in | Wt 187.8 lb

## 2012-03-16 DIAGNOSIS — I509 Heart failure, unspecified: Secondary | ICD-10-CM

## 2012-03-16 DIAGNOSIS — I5022 Chronic systolic (congestive) heart failure: Secondary | ICD-10-CM

## 2012-03-16 DIAGNOSIS — I10 Essential (primary) hypertension: Secondary | ICD-10-CM

## 2012-03-16 DIAGNOSIS — N529 Male erectile dysfunction, unspecified: Secondary | ICD-10-CM

## 2012-03-16 DIAGNOSIS — E785 Hyperlipidemia, unspecified: Secondary | ICD-10-CM

## 2012-03-16 LAB — COMPLETE METABOLIC PANEL WITH GFR
Alkaline Phosphatase: 67 U/L (ref 39–117)
CO2: 27 mEq/L (ref 19–32)
Creat: 1.08 mg/dL (ref 0.50–1.35)
GFR, Est African American: 78 mL/min
GFR, Est Non African American: 67 mL/min
Glucose, Bld: 69 mg/dL — ABNORMAL LOW (ref 70–99)
Sodium: 139 mEq/L (ref 135–145)
Total Bilirubin: 0.6 mg/dL (ref 0.3–1.2)
Total Protein: 7.8 g/dL (ref 6.0–8.3)

## 2012-03-16 LAB — CBC WITH DIFFERENTIAL/PLATELET
Basophils Relative: 0 % (ref 0–1)
Eosinophils Absolute: 0.2 10*3/uL (ref 0.0–0.7)
Eosinophils Relative: 3 % (ref 0–5)
Hemoglobin: 13.7 g/dL (ref 13.0–17.0)
MCH: 31.3 pg (ref 26.0–34.0)
MCHC: 34.1 g/dL (ref 30.0–36.0)
MCV: 91.8 fL (ref 78.0–100.0)
Monocytes Relative: 14 % — ABNORMAL HIGH (ref 3–12)
Neutrophils Relative %: 40 % — ABNORMAL LOW (ref 43–77)
Platelets: 184 10*3/uL (ref 150–400)

## 2012-03-16 MED ORDER — TADALAFIL 5 MG PO TABS: 5.0000 mg | ORAL_TABLET | Freq: Every day | ORAL | Status: AC | PRN

## 2012-03-16 NOTE — Progress Notes (Signed)
  Subjective:    Patient ID: Timothy Bradford, male    DOB: 10-11-1935, 76 y.o.   MRN: [id number]  HPI Presents for follow up. Feels well.  Denies CP, SOB.  Denies PND, denies orthopnea.  Denies melena, hematochezia.  Denies DOE. Denies melena, hematochezia.  Denies dysuria or problems with urination.  Denies fever, chills, N/V/D/C.  Denies hematuria.  Denies cough. States he is having some difficulty with erections every once in a while. No falls. States he has no other complaints for me today. He has a hx of cardiac cath in 2004:  The LV showed mild global hypokinesia. EF of 45-50%. LVEDP was  12 mmHg. Left main was long which was patent. LAD has 10% distal stenosis.  The vessel proximally was very tortuous. Diagonal one was small which was  patent. Left circumflex was patent proximally and in mid portion and then  tapers down in AV groove after giving off OM-2. OM-1 was large which was  patent. OM-2 was small which was patent. Dara Lords was patent. Arteriotomy  was closed with Perclose without complications. The patient tolerated the  procedure well. The patient was transferred to recovery room in stable  condition.   Review of Systems Complete 13 point review of systems otherwise negative except for that stated in the HPI.    Objective:   Physical Exam Filed Vitals:   03/16/12 1011  BP: 141/83  Pulse: 68  Temp: 97.5 F (36.4 C)   GEN: AAOx3, NAD, able to sit up and walk to table without difficulty. HEENT: EOMI, PEERLA, no icterus, no adenopathy. CV: S1S2, no m/r/g, RRR, no JVD, no HJR. PULM: CTA bilat  ABD/GI: Soft, NT, +BS, no guarding, no HSM. LE/UE: 2/4 pulses, 1+ pitting edema bilat. No lesions. NEURO: CN II-XII intact, no focal deficits.    Assessment & Plan:  76 yr. Old male w/ pmhx significant for Chronic systolic heart failure EF 45%, HTN, OA, HL, BPH s/p TURP with hx urinary retention, presents for follow up. 1) Chronic systolic heart failure: On metoprolol, ASA.  Compensated. He is not currently on ACE-i due to angioedema hx. He has non-obstructive CAD per above cath. 2) HL: LDL 61, LFT's WNL. On simvastatin. 3) HTN: Slightly elevated today, otherwise controlled in past. Educated on salt use. No changes for now. 4) ED: Will give Cialis 5 mg to use with instructions provided. 5) Health maintenance: States flu vaccine this year in clinic.  Pneumovax UTD. Colonoscopy in 2008 without polyps.   Return in three months.  Jonah Blue

## 2012-03-16 NOTE — Patient Instructions (Signed)
Lab work today. Take Cialis 5 mg once daily as needed for erectile dysfunction. Return in three months.

## 2012-05-27 ENCOUNTER — Other Ambulatory Visit: Payer: Self-pay | Admitting: Internal Medicine

## 2012-06-13 ENCOUNTER — Other Ambulatory Visit: Payer: Self-pay | Admitting: Internal Medicine

## 2012-08-21 ENCOUNTER — Other Ambulatory Visit: Payer: Self-pay | Admitting: *Deleted

## 2012-08-21 MED ORDER — POTASSIUM CHLORIDE 20 MEQ PO PACK: PACK | ORAL | Status: DC

## 2012-10-26 ENCOUNTER — Ambulatory Visit (INDEPENDENT_AMBULATORY_CARE_PROVIDER_SITE_OTHER): Payer: Medicare Other | Admitting: Internal Medicine

## 2012-10-26 ENCOUNTER — Encounter: Payer: Self-pay | Admitting: Internal Medicine

## 2012-10-26 VITALS — BP 121/80 | HR 84 | Temp 97.6°F | Ht 73.4 in | Wt 189.0 lb

## 2012-10-26 DIAGNOSIS — I5022 Chronic systolic (congestive) heart failure: Secondary | ICD-10-CM

## 2012-10-26 DIAGNOSIS — Z0000 Encounter for general adult medical examination without abnormal findings: Secondary | ICD-10-CM

## 2012-10-26 DIAGNOSIS — H547 Unspecified visual loss: Secondary | ICD-10-CM

## 2012-10-26 DIAGNOSIS — E785 Hyperlipidemia, unspecified: Secondary | ICD-10-CM

## 2012-10-26 DIAGNOSIS — M1990 Unspecified osteoarthritis, unspecified site: Secondary | ICD-10-CM

## 2012-10-26 DIAGNOSIS — K089 Disorder of teeth and supporting structures, unspecified: Secondary | ICD-10-CM

## 2012-10-26 DIAGNOSIS — I10 Essential (primary) hypertension: Secondary | ICD-10-CM

## 2012-10-26 DIAGNOSIS — L820 Inflamed seborrheic keratosis: Secondary | ICD-10-CM

## 2012-10-26 DIAGNOSIS — I509 Heart failure, unspecified: Secondary | ICD-10-CM

## 2012-10-26 LAB — LIPID PANEL
Cholesterol: 122 mg/dL (ref 0–200)
HDL: 35 mg/dL — ABNORMAL LOW (ref >39)
Total CHOL/HDL Ratio: 3.5 Ratio
Triglycerides: 85 mg/dL (ref <150)
VLDL: 17 mg/dL (ref 0–40)

## 2012-10-26 LAB — BASIC METABOLIC PANEL WITH GFR
CO2: 25 mEq/L (ref 19–32)
Chloride: 108 mEq/L (ref 96–112)
Creat: 1.11 mg/dL (ref 0.50–1.35)
GFR, Est Non African American: 65 mL/min
Potassium: 4 mEq/L (ref 3.5–5.3)
Sodium: 141 mEq/L (ref 135–145)

## 2012-10-26 MED ORDER — METOPROLOL TARTRATE 25 MG PO TABS: 25.0000 mg | ORAL_TABLET | Freq: Two times a day (BID) | ORAL | Status: DC

## 2012-10-26 NOTE — Assessment & Plan Note (Signed)
The etiology of his systolic dysfxn is not clear. He did have minor stenosis but no h/o AMI so unlikely ischemic cardiomyopathy. Has h/o sig ETOH use so that likely is cause. Quit in 1999 and last EF from 2004 so likely permanent and no need to repeat. He reports no CHF exac in past. On ACEI, BB, statin. No ASA since not ischemic. Check BMP today.

## 2012-10-26 NOTE — Progress Notes (Signed)
  Subjective:    Patient ID: Timothy Bradford, male    DOB: 30-Jan-1936, 77 y.o.   MRN: [id number]  Fall    Please see the A&P for the status of the pt's chronic medical problems.   Review of Systems  Constitutional: Negative for activity change, fatigue and unexpected weight change.  HENT: Positive for dental problem. Negative for rhinorrhea.   Eyes: Positive for visual disturbance. Negative for itching.  Respiratory: Negative for shortness of breath.   Cardiovascular: Positive for leg swelling. Negative for chest pain.  Gastrointestinal: Negative for diarrhea and constipation.  Musculoskeletal: Positive for arthralgias and gait problem. Negative for back pain.  Skin: Positive for wound.  Neurological: Positive for dizziness and light-headedness. Negative for weakness.  Psychiatric/Behavioral: Negative for sleep disturbance.       Objective:   Physical Exam  Constitutional: He is oriented to person, place, and time. He appears well-developed and well-nourished. No distress.  HENT:  Head: Normocephalic and atraumatic.  Right Ear: External ear normal.  Left Ear: External ear normal.  Nose: Nose normal.  Eyes: Conjunctivae and EOM are normal.  Cardiovascular: Normal rate, regular rhythm and normal heart sounds.  Exam reveals no friction rub.   No murmur heard. Pulmonary/Chest: Effort normal and breath sounds normal. No respiratory distress.  Musculoskeletal: Normal range of motion. He exhibits edema. He exhibits no tenderness.  Neurological: He is alert and oriented to person, place, and time.  Skin: Skin is warm and dry. He is not diaphoretic.  CVI skin changes B lower extremities Multiple seborrhoic keratosis over trunk. One on posterior upper L shoulder is large, irritated, and bleeding.   Psychiatric: He has a normal mood and affect. His behavior is normal. Judgment and thought content normal.          Assessment & Plan:

## 2012-10-26 NOTE — Assessment & Plan Note (Signed)
He has multiple SK on his trunk. He has seen Roxan Hockey in past and they froze off an inflamed one. He now has an inflamed, large, and bleeding SK on posterior upper L shoulder. Therefore, refer back to Derm.

## 2012-10-26 NOTE — Assessment & Plan Note (Addendum)
Occasional knee pain or "rough" as he refers to it. Limps a bit. But able to walk outside QD and play with grandkids. No falls.

## 2012-10-26 NOTE — Assessment & Plan Note (Signed)
LDL was last checked 05/2011 and very well controlled. Will check today. Cont statin

## 2012-10-26 NOTE — Assessment & Plan Note (Signed)
Referral to optho - needs new glasses and feels eyesight is poor. Referral to dental - poor dentition.

## 2012-10-26 NOTE — Patient Instructions (Addendum)
I am sending you to demratology for the lesion on your back I have made referrals to the eye doctor and the dentist I have sent your refill to [facility] See Dr Kem Kays in 6 months.   Timothy Bradford

## 2012-10-26 NOTE — Assessment & Plan Note (Signed)
He has orthostatic hypotension and is symptomatic. But has learned to stand slowly and wait before walking. No falls. Therefore, will leave meds as is even though baseline was a bit high.

## 2012-10-27 ENCOUNTER — Telehealth: Payer: Self-pay | Admitting: *Deleted

## 2012-11-02 ENCOUNTER — Telehealth: Payer: Self-pay | Admitting: *Deleted

## 2012-11-02 NOTE — Telephone Encounter (Signed)
Call to pt previously to ask about his Medicare Benefits to see if he qualifies to go to the Dental Clinic.  Message to [facility] to ask if pt will qualify for the Dental Clinic.  Angelina Ok, RN 11/02/2012 10:54 AM

## 2012-11-21 ENCOUNTER — Telehealth: Payer: Self-pay | Admitting: *Deleted

## 2012-11-22 NOTE — Telephone Encounter (Signed)
Pt's wife called asking for name of meds that pt is allergic to.  Pt's dds called and given this information.

## 2012-11-30 ENCOUNTER — Ambulatory Visit: Payer: Medicare Other

## 2012-12-04 DIAGNOSIS — C44629 Squamous cell carcinoma of skin of left upper limb, including shoulder: Secondary | ICD-10-CM

## 2012-12-04 HISTORY — DX: Squamous cell carcinoma of skin of left upper limb, including shoulder: C44.629

## 2012-12-13 ENCOUNTER — Other Ambulatory Visit: Payer: Self-pay | Admitting: Internal Medicine

## 2012-12-13 ENCOUNTER — Other Ambulatory Visit: Payer: Self-pay | Admitting: Dermatology

## 2013-02-08 ENCOUNTER — Encounter: Payer: Medicare Other | Admitting: Internal Medicine

## 2013-02-22 ENCOUNTER — Encounter: Payer: Self-pay | Admitting: Internal Medicine

## 2013-02-22 ENCOUNTER — Ambulatory Visit (INDEPENDENT_AMBULATORY_CARE_PROVIDER_SITE_OTHER): Payer: Medicare Other | Admitting: Internal Medicine

## 2013-02-22 VITALS — BP 118/71 | HR 76 | Temp 97.4°F | Ht 72.0 in | Wt 183.4 lb

## 2013-02-22 DIAGNOSIS — Z23 Encounter for immunization: Secondary | ICD-10-CM

## 2013-02-22 DIAGNOSIS — L820 Inflamed seborrheic keratosis: Secondary | ICD-10-CM

## 2013-02-22 DIAGNOSIS — I10 Essential (primary) hypertension: Secondary | ICD-10-CM

## 2013-02-22 DIAGNOSIS — I5022 Chronic systolic (congestive) heart failure: Secondary | ICD-10-CM

## 2013-02-22 DIAGNOSIS — I509 Heart failure, unspecified: Secondary | ICD-10-CM

## 2013-02-22 MED ORDER — ZOSTER VACCINE LIVE 19400 UNT/0.65ML ~~LOC~~ SOLR: 0.6500 mL | Freq: Once | SUBCUTANEOUS | Status: DC

## 2013-02-22 NOTE — Patient Instructions (Addendum)
-  Follow up with dermatology as scheduled. -Continue current medications. -Flu shot today. -Obtain shingles vaccine at your pharmacy. -Return to clinic in 3 months.

## 2013-02-22 NOTE — Progress Notes (Signed)
  Subjective:    Patient ID: Timothy Bradford, male    DOB: 04/16/1935, 77 y.o.   MRN: [id number]  HPI States he has swelling of his LE, but this has not progressed. He denies SOB or CP. Denies orthopnea or PND. LE not interfering with ADLs.   Review of Systems  Constitutional: Negative for fever, chills, appetite change and unexpected weight change.  HENT: Negative for hearing loss and voice change.   Eyes: Negative for visual disturbance.  Respiratory: Negative for chest tightness, shortness of breath, wheezing and stridor.   Cardiovascular: Positive for leg swelling. Negative for chest pain and palpitations.  Gastrointestinal: Negative for nausea, vomiting, constipation, blood in stool and abdominal distention.  Genitourinary: Negative for dysuria, urgency, flank pain and difficulty urinating.  Musculoskeletal: Positive for arthralgias.  Neurological: Negative for dizziness and light-headedness.  Psychiatric/Behavioral: Negative for agitation.       Objective:   Physical Exam  Constitutional: He is oriented to person, place, and time. He appears well-developed and well-nourished. No distress.  HENT:  Head: Normocephalic and atraumatic.  Mouth/Throat: No oropharyngeal exudate.  Eyes: Conjunctivae and EOM are normal. Pupils are equal, round, and reactive to light. Right eye exhibits no discharge. Left eye exhibits no discharge.  Neck: Normal range of motion. Neck supple. No JVD present. No thyromegaly present.  Cardiovascular: Normal rate, regular rhythm, normal heart sounds and intact distal pulses.  Exam reveals no gallop and no friction rub.   No murmur heard. Pulmonary/Chest: Effort normal and breath sounds normal. No respiratory distress. He has no wheezes.  Abdominal: Soft. Bowel sounds are normal. He exhibits no distension. There is no tenderness. There is no rebound.  Musculoskeletal: Normal range of motion. He exhibits edema. He exhibits no tenderness.  Neurological: He is alert  and oriented to person, place, and time. No cranial nerve deficit. Coordination normal.  Skin: Skin is warm and dry. He is not diaphoretic.  Multiple SK throughout back and chest/abdomen. Recent surgical removal of upper left back, c/d/i.  Psychiatric: He has a normal mood and affect. His behavior is normal. Thought content normal.   Filed Vitals:   02/22/13 0911  BP: 118/71  Pulse: 76  Temp: 97.4 F (36.3 C)          Assessment & Plan:  76 yr. Old male w/ pmhx significant for Chronic systolic heart failure EF 45%, HTN, OA, HL, BPH s/p TURP with hx urinary retention, multiple SK's s/p removal upper left back, presents for follow up. 1) Chronic systolic HF: Compensated. Continue current therapy. 2) HL: On statin therapy.  3) HTN: Well controlled. 4) SK: He has had recent removal of SK in left upper back. 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 He has seen Derm (Dr. Alean Rinne) and will be following up in next 1-2 weeks. 5) Health Maintenance: He is having cataract surgery on 12/9 (Dr. Terri Piedra), he's seen optho. Colonoscopy performed on 2008, next due in 2018. Flu vaccine today.  Zoster vaccine sent to pharmacy.  Jonah Blue, DO, FACP Faculty [facility] 02/22/2013, 9:58 AM

## 2013-03-18 ENCOUNTER — Other Ambulatory Visit: Payer: Self-pay | Admitting: Internal Medicine

## 2013-05-10 ENCOUNTER — Encounter: Payer: Medicare Other | Admitting: Internal Medicine

## 2013-06-22 ENCOUNTER — Other Ambulatory Visit: Payer: Self-pay | Admitting: Internal Medicine

## 2013-07-12 ENCOUNTER — Ambulatory Visit (INDEPENDENT_AMBULATORY_CARE_PROVIDER_SITE_OTHER): Payer: Medicare Other | Admitting: Internal Medicine

## 2013-07-12 ENCOUNTER — Ambulatory Visit (HOSPITAL_COMMUNITY)
Admission: RE | Admit: 2013-07-12 | Discharge: 2013-07-12 | Disposition: A | Discharge status: Home / Self Care | Payer: Medicare Other | Source: Ambulatory Visit | Attending: Internal Medicine | Admitting: Internal Medicine

## 2013-07-12 ENCOUNTER — Encounter: Payer: Self-pay | Admitting: Internal Medicine

## 2013-07-12 VITALS — BP 141/79 | HR 76 | Temp 97.6°F | Ht 73.0 in | Wt 188.9 lb

## 2013-07-12 DIAGNOSIS — M25439 Effusion, unspecified wrist: Secondary | ICD-10-CM | POA: Insufficient documentation

## 2013-07-12 DIAGNOSIS — I10 Essential (primary) hypertension: Secondary | ICD-10-CM

## 2013-07-12 DIAGNOSIS — I5022 Chronic systolic (congestive) heart failure: Secondary | ICD-10-CM

## 2013-07-12 DIAGNOSIS — E785 Hyperlipidemia, unspecified: Secondary | ICD-10-CM

## 2013-07-12 DIAGNOSIS — M25432 Effusion, left wrist: Secondary | ICD-10-CM

## 2013-07-12 NOTE — Patient Instructions (Signed)
-  Your wrist is improving on its own, so we will watch this. If it becomes more swollen, red, or more painful, come back to be seen. -No blood work today. -Remember your shingles vaccine at your pharmacy. -Continue to follow up with your dermatologist.

## 2013-07-12 NOTE — Progress Notes (Signed)
Subjective:    Patient ID: Timothy Bradford, male    DOB: 12-21-1935, 78 y.o.   MRN: [id number]  HPI His wife presents with him today. States last week he was having a fever up to 101 F, mild dry cough. This has slowly resolved. States only a slight cough today, but overall feeling better. Symptoms are resolving. Denies CP/SOB. LE swelling has improved. Recent spontaneous L wrist swelling, which he states is getting better. Denies injury/trauma. His states only episode, noticed a few days ago. No other joints involved. Does not feel he had a fever around the time he noticed this.     Review of Systems  Constitutional: Positive for fever. Negative for chills, appetite change and fatigue.  HENT: Negative for rhinorrhea, sinus pressure and sore throat.   Respiratory: Positive for cough. Negative for shortness of breath.   Cardiovascular: Positive for leg swelling. Negative for chest pain.  Gastrointestinal: Negative for nausea, vomiting, abdominal pain, diarrhea and blood in stool.  Endocrine: Negative for polyuria.  Genitourinary: Negative for dysuria and difficulty urinating.  Neurological: Negative for dizziness, syncope, weakness and light-headedness.  Psychiatric/Behavioral: Negative for behavioral problems.       Objective:   Physical Exam  Constitutional: He is oriented to person, place, and time. He appears well-developed and well-nourished.  HENT:  Head: Normocephalic and atraumatic.  Eyes: Conjunctivae and EOM are normal. Pupils are equal, round, and reactive to light. Right eye exhibits no discharge. Left eye exhibits no discharge.  Neck: Normal range of motion. Neck supple. No JVD present.  Cardiovascular: Normal rate and regular rhythm.  Exam reveals no friction rub.   No murmur heard. Pulmonary/Chest: Effort normal and breath sounds normal. No respiratory distress. He has no wheezes. He has no rales.  Abdominal: Soft. Bowel sounds are normal. He exhibits no distension and no  mass. There is no tenderness. There is no guarding.  Musculoskeletal: Normal range of motion. He exhibits edema.       Left wrist: He exhibits bony tenderness and swelling.       Arms: Lymphadenopathy:    He has no cervical adenopathy.  Neurological: He is alert and oriented to person, place, and time. He has normal reflexes. No cranial nerve deficit.  Skin: Skin is warm. He is not diaphoretic.  Psychiatric: He has a normal mood and affect.   Filed Vitals:   07/12/13 1019  BP: 141/79  Pulse: 76  Temp: 97.6 F (36.4 C)  O2 sat = 97% RA (personally checked)      Assessment & Plan:   76 yr. Old male w/ pmhx significant for Chronic systolic heart failure EF 45%, HTN, OA, HL, BPH s/p TURP with hx urinary retention, multiple SK's s/p removal upper left back, presents for follow up.  1) (Chronic systolic heart failure) HFrEF: Compensated. Continue current therapy.  2) HL: On statin therapy.  3) HTN: Well controlled.  4) SK: He has had recent removal of SK in left upper back. 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 He has seen Dermatology (Dr. Harvel Quale) and is being closely followed, discussed with his wife. 5) Left wrist pain/swelling: I do not think this is septic arthritis. Denies trauma. Suspicion of crystal induced arthropathy. Feels this is getting better on its own. If worsens or returns in other joints, will need to aspirate. WIll obtain plain film of left wrist. 6) Health Maintenance: He is s/p cataract surgery on 12/9 (Dr. Allyson Sabal). Colonoscopy performed on  2008, next due in 2018. Flu UTD. Zoster vaccine sent to pharmacy on previous visit, he was unable to pay for this last time, but he will try again.  -Return in 3 months.  Dominic Pea, DO, [facility] 07/12/2013, 11:01 AM

## 2013-07-13 ENCOUNTER — Encounter: Payer: Self-pay | Admitting: Internal Medicine

## 2013-07-13 ENCOUNTER — Encounter (HOSPITAL_COMMUNITY): Payer: Self-pay

## 2013-07-13 ENCOUNTER — Inpatient Hospital Stay (HOSPITAL_COMMUNITY)
Admission: AD | Admit: 2013-07-13 | Discharge: 2013-07-16 | DRG: 554 | Disposition: A | Discharge status: Home / Self Care | Payer: Medicare Other | Source: Ambulatory Visit | Attending: Internal Medicine | Admitting: Internal Medicine

## 2013-07-13 ENCOUNTER — Ambulatory Visit (INDEPENDENT_AMBULATORY_CARE_PROVIDER_SITE_OTHER): Payer: Medicare Other | Admitting: Internal Medicine

## 2013-07-13 ENCOUNTER — Observation Stay (HOSPITAL_COMMUNITY): Payer: Medicare Other

## 2013-07-13 VITALS — BP 131/77 | HR 89 | Temp 100.3°F | Wt 189.1 lb

## 2013-07-13 DIAGNOSIS — M25569 Pain in unspecified knee: Secondary | ICD-10-CM | POA: Diagnosis present

## 2013-07-13 DIAGNOSIS — L821 Other seborrheic keratosis: Secondary | ICD-10-CM | POA: Diagnosis present

## 2013-07-13 DIAGNOSIS — M25432 Effusion, left wrist: Secondary | ICD-10-CM

## 2013-07-13 DIAGNOSIS — M25439 Effusion, unspecified wrist: Secondary | ICD-10-CM

## 2013-07-13 DIAGNOSIS — R059 Cough, unspecified: Secondary | ICD-10-CM | POA: Diagnosis present

## 2013-07-13 DIAGNOSIS — R05 Cough: Secondary | ICD-10-CM | POA: Diagnosis present

## 2013-07-13 DIAGNOSIS — I872 Venous insufficiency (chronic) (peripheral): Secondary | ICD-10-CM | POA: Diagnosis present

## 2013-07-13 DIAGNOSIS — E785 Hyperlipidemia, unspecified: Secondary | ICD-10-CM | POA: Diagnosis present

## 2013-07-13 DIAGNOSIS — G8929 Other chronic pain: Secondary | ICD-10-CM | POA: Diagnosis present

## 2013-07-13 DIAGNOSIS — I5022 Chronic systolic (congestive) heart failure: Secondary | ICD-10-CM | POA: Diagnosis present

## 2013-07-13 DIAGNOSIS — L0390 Cellulitis, unspecified: Secondary | ICD-10-CM | POA: Diagnosis present

## 2013-07-13 DIAGNOSIS — M109 Gout, unspecified: Principal | ICD-10-CM | POA: Diagnosis present

## 2013-07-13 DIAGNOSIS — I509 Heart failure, unspecified: Secondary | ICD-10-CM | POA: Diagnosis present

## 2013-07-13 DIAGNOSIS — Z87891 Personal history of nicotine dependence: Secondary | ICD-10-CM

## 2013-07-13 DIAGNOSIS — IMO0002 Reserved for concepts with insufficient information to code with codable children: Secondary | ICD-10-CM | POA: Diagnosis present

## 2013-07-13 DIAGNOSIS — M1990 Unspecified osteoarthritis, unspecified site: Secondary | ICD-10-CM | POA: Diagnosis present

## 2013-07-13 DIAGNOSIS — I10 Essential (primary) hypertension: Secondary | ICD-10-CM | POA: Diagnosis present

## 2013-07-13 DIAGNOSIS — Z7982 Long term (current) use of aspirin: Secondary | ICD-10-CM

## 2013-07-13 LAB — CBC WITH DIFFERENTIAL/PLATELET
BASOS ABS: 0 10*3/uL (ref 0.0–0.1)
Basophils Relative: 0 % (ref 0–1)
Eosinophils Absolute: 0 10*3/uL (ref 0.0–0.7)
Eosinophils Relative: 0 % (ref 0–5)
HEMATOCRIT: 36.4 % — AB (ref 39.0–52.0)
HEMOGLOBIN: 12.3 g/dL — AB (ref 13.0–17.0)
LYMPHS PCT: 20 % (ref 12–46)
Lymphs Abs: 1.7 10*3/uL (ref 0.7–4.0)
MCH: 31.2 pg (ref 26.0–34.0)
MCHC: 33.8 g/dL (ref 30.0–36.0)
MCV: 92.4 fL (ref 78.0–100.0)
MONO ABS: 1.8 10*3/uL — AB (ref 0.1–1.0)
MONOS PCT: 21 % — AB (ref 3–12)
NEUTROS ABS: 5 10*3/uL (ref 1.7–7.7)
Neutrophils Relative %: 59 % (ref 43–77)
Platelets: 218 10*3/uL (ref 150–400)
RBC: 3.94 MIL/uL — ABNORMAL LOW (ref 4.22–5.81)
RDW: 12.9 % (ref 11.5–15.5)
WBC: 8.4 10*3/uL (ref 4.0–10.5)

## 2013-07-13 LAB — COMPLETE METABOLIC PANEL WITH GFR
ALBUMIN: 3.5 g/dL (ref 3.5–5.2)
ALT: 11 U/L (ref 0–53)
AST: 15 U/L (ref 0–37)
Alkaline Phosphatase: 59 U/L (ref 39–117)
BUN: 13 mg/dL (ref 6–23)
CALCIUM: 9.3 mg/dL (ref 8.4–10.5)
CHLORIDE: 101 meq/L (ref 96–112)
CO2: 21 mEq/L (ref 19–32)
Creat: 1.02 mg/dL (ref 0.50–1.35)
GFR, Est African American: 83 mL/min
GFR, Est Non African American: 72 mL/min
Glucose, Bld: 119 mg/dL — ABNORMAL HIGH (ref 70–99)
POTASSIUM: 4.3 meq/L (ref 3.5–5.3)
Sodium: 138 mEq/L (ref 135–145)
TOTAL PROTEIN: 8 g/dL (ref 6.0–8.3)
Total Bilirubin: 0.9 mg/dL (ref 0.3–1.2)

## 2013-07-13 MED ORDER — HEPARIN SODIUM (PORCINE) 5000 UNIT/ML IJ SOLN
5000.0000 [IU] | Freq: Three times a day (TID) | INTRAMUSCULAR | Status: DC
Start: 2013-07-13 — End: 2013-07-16
  Administered 2013-07-13 – 2013-07-15 (×7): 5000 [IU] via SUBCUTANEOUS
  Filled 2013-07-13 (×11): qty 1

## 2013-07-13 MED ORDER — CLINDAMYCIN PHOSPHATE 600 MG/50ML IV SOLN
600.0000 mg | Freq: Three times a day (TID) | INTRAVENOUS | Status: DC
  Administered 2013-07-14: 600 mg via INTRAVENOUS
  Filled 2013-07-13 (×3): qty 50

## 2013-07-13 MED ORDER — ASPIRIN EC 81 MG PO TBEC
81.0000 mg | DELAYED_RELEASE_TABLET | Freq: Every day | ORAL | Status: DC
  Administered 2013-07-13 – 2013-07-16 (×4): 81 mg via ORAL
  Filled 2013-07-13 (×4): qty 1

## 2013-07-13 MED ORDER — CLINDAMYCIN PHOSPHATE 600 MG/50ML IV SOLN
600.0000 mg | Freq: Once | INTRAVENOUS | Status: AC
  Administered 2013-07-13: 600 mg via INTRAVENOUS
  Filled 2013-07-13 (×2): qty 50

## 2013-07-13 MED ORDER — LATANOPROST 0.005 % OP SOLN
1.0000 [drp] | Freq: Every day | OPHTHALMIC | Status: DC
  Administered 2013-07-13 – 2013-07-15 (×3): 1 [drp] via OPHTHALMIC
  Filled 2013-07-13 (×3): qty 2.5

## 2013-07-13 MED ORDER — DORZOLAMIDE HCL 2 % OP SOLN
1.0000 [drp] | Freq: Three times a day (TID) | OPHTHALMIC | Status: DC
  Administered 2013-07-13 – 2013-07-16 (×7): 1 [drp] via OPHTHALMIC
  Filled 2013-07-13: qty 10

## 2013-07-13 MED ORDER — DIFLUPREDNATE 0.05 % OP EMUL
1.0000 [drp] | Freq: Two times a day (BID) | OPHTHALMIC | Status: DC
  Administered 2013-07-13 – 2013-07-16 (×6): 1 [drp] via OPHTHALMIC

## 2013-07-13 MED ORDER — HYDROCODONE-ACETAMINOPHEN 5-325 MG PO TABS
1.0000 | ORAL_TABLET | ORAL | Status: DC | PRN
  Administered 2013-07-13 – 2013-07-15 (×5): 1 via ORAL
  Filled 2013-07-13 (×5): qty 1

## 2013-07-13 MED ORDER — METOPROLOL TARTRATE 25 MG PO TABS
25.0000 mg | ORAL_TABLET | Freq: Two times a day (BID) | ORAL | Status: DC
  Administered 2013-07-13 – 2013-07-16 (×6): 25 mg via ORAL
  Filled 2013-07-13 (×7): qty 1

## 2013-07-13 NOTE — Consult Note (Signed)
ORTHOPAEDIC CONSULTATION  REQUESTING PHYSICIAN: Axel Filler, MD  Chief Complaint: Left wrist pain  HPI: Timothy Bradford is a 78 y.o. male who complains of  Off and on Left wrist pain, it was red over the dorsum of the wrist yesterday but is much better per the patient, pain is significantly improved as well  Dry tap yesterday per medicine clinic.   Past Medical History  Diagnosis Date  . Hypertension   . Hyperlipidemia   . Osteoarthritis   . Erectile dysfunction   . Chronic venous insufficiency   . CHF 7741    Systolic failure. Cardiac cath in 2004: The LV showed mild global hypokinesia. EF of 45-50%. LVEDP was 12 mmHg. Left main was long which was patent. LAD has 10% distal stenosis. The vessel proximally was very tortuous. Diagonal one was small which was patent. Left circumflex was patent proximally and in mid portion and then tapers down in AV groove after giving off OM-2. OM-1 was large which was p  . Seborrheic keratoses     Multiple. Sees Lyndle Herrlich   Past Surgical History  Procedure Laterality Date  . Hernia repair     History   Social History  . Marital Status: Married    Spouse Name: N/A    Number of Children: N/A  . Years of Education: N/A   Social History Main Topics  . Smoking status: Former Smoker    Quit date: 03/16/1988  . Smokeless tobacco: None  . Alcohol Use: No     Comment: Quit 1999, h/o DT's and szs. No ETOH since 1999  . Drug Use: No  . Sexual Activity: None   Other Topics Concern  . None   Social History Narrative  . None   History reviewed. No pertinent family history. Allergies  Allergen Reactions  . Ace Inhibitors     REACTION: "angioedema"cited on chart cover  . Fosinopril Sodium    Prior to Admission medications   Medication Sig Start Date End Date Taking? Authorizing Provider  aspirin EC 81 MG tablet Take 81 mg by mouth daily.   Yes Historical Provider, MD  bimatoprost (LUMIGAN) 0.01 % SOLN Place 1 drop into the  right eye at bedtime.   Yes Historical Provider, MD  Difluprednate (DUREZOL) 0.05 % EMUL Place 1 drop into both eyes 2 (two) times daily.    Yes Historical Provider, MD  dorzolamide (TRUSOPT) 2 % ophthalmic solution Place 1 drop into both eyes 3 (three) times daily.   Yes Historical Provider, MD  furosemide (LASIX) 40 MG tablet Take 40 mg by mouth daily.   Yes Historical Provider, MD  metoprolol tartrate (LOPRESSOR) 25 MG tablet Take 25 mg by mouth 2 (two) times daily.   Yes Historical Provider, MD  potassium chloride SA (K-DUR,KLOR-CON) 20 MEQ tablet Take 20 mEq by mouth 2 (two) times daily.   Yes Historical Provider, MD  simvastatin (ZOCOR) 40 MG tablet Take 40 mg by mouth at bedtime.   Yes Historical Provider, MD   Dg Wrist Complete Left  07/12/2013   CLINICAL DATA:  Left wrist swelling  EXAM: LEFT WRIST - COMPLETE 3+ VIEW  COMPARISON:  None.  FINDINGS: Moderate degenerative change at the base of the thumb with joint space narrowing and spurring. Moderate degenerative change in the radiocarpal joint with joint space narrowing and spurring. Degenerative change in the distal radioulnar joint.  Negative for fracture or erosion.  Arterial calcification noted.  IMPRESSION: Moderate degenerative change as above.  Negative for fracture.   Electronically Signed   By: Franchot Gallo M.D.   On: 07/12/2013 12:07    Positive ROS: All other systems have been reviewed and were otherwise negative with the exception of those mentioned in the HPI and as above.  Labs cbc  Recent Labs  07/13/13 1640  WBC 8.4  HGB 12.3*  HCT 36.4*  PLT 218    Labs inflam No results found for this basename: ESR, CRP,  in the last 72 hours  Labs coag No results found for this basename: INR, PT, PTT,  in the last 72 hours   Recent Labs  07/13/13 1640  NA 138  K 4.3  CL 101  CO2 21  GLUCOSE 119*  BUN 13  CREATININE 1.02  CALCIUM 9.3    Physical Exam: Filed Vitals:   07/13/13 1742  BP: 135/84  Pulse: 76    Temp: 98 F (36.7 C)  Resp: 16   General: Alert, no acute distress Cardiovascular: No pedal edema Respiratory: No cyanosis, no use of accessory musculature GI: No organomegaly, abdomen is soft and non-tender Skin: No lesions in the area of chief complaint Neurologic: Sensation intact distally Psychiatric: Patient is competent for consent with normal mood and affect Lymphatic: No axillary or cervical lymphadenopathy  MUSCULOSKELETAL:  LUE: limited ROM at wrist and elbow with palpable bony overgrowth(bone spurs). Painless moderate arc motion. Minimal swelling, no erythema. NVI.  Other extremities are atraumatic with painless ROM and NVI.  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    Renette Butters, MD Cell [phone]   07/13/2013 6:54 PM

## 2013-07-13 NOTE — Assessment & Plan Note (Signed)
Differentials include Septic Arthritis vs Acute Gout vs Osteoarthritis vs CPPD crystal induced synovitis. Attempts to aspirate joint fluid in the clinic were unsuccessful.  Plans: Admit to the Teaching service. Orthopedics is called for a consult. (Dr. Percell Miller - [phone]) Start Empiric abx therapy for septic arthritis pending joint fluid analysis. Get Blood cultures x2 before abx therapy  Joint fluid analysis after joint aspiration as per ortho. Routene labs

## 2013-07-13 NOTE — H&P (Signed)
Date: 07/13/2013               Patient Name:  Timothy Bradford MRN: [id number]  DOB: [birth year] Age / Sex: 78 y.o., male   PCP: Dominic Pea, DO         Medical Service: Internal Medicine Teaching Service         Attending Physician: Dr. Axel Filler, MD    First Contact: Dr. Mechele Claude Pager: [phone]  Second Contact: Dr. Eulas Post Pager: [phone]       After Hours (After 5p/  First Contact Pager: [phone]  weekends / holidays): Second Contact Pager: [phone]   Chief Complaint: Left Wrist pain  History of Present Illness: 78 y.o. gentleman with PMH significant for HTN, HLD, Systolic Heart failure, Osteoarthritis, seborrheic keratosis,  presents with c/o left wrist pain, that started 3 days ago, with any some minimal swelling, which reduced range of movement, an aggravation of pain with movement. Associated chills with measured temperature of 99.7. No prior episodes of joint swelling, but endorses having chronic knee pains- worse with activity. Patient does have a dry cough, with symptoms of a cold, her temperature then high of 100.1, is resolved spontaneously.  No known history of trauma, no falls, no headaches no dizziness, no shortness of breath, no diarrhea no constipation, no dysuria, denies alcohol intake, and home meds include frusemide. Presented to [facility] yesterday , X-rays of the left wrist were obtained that revealed Moderate degenerative changes at the base of the thumb and radiocarpal joint, with joint space narrowing and spurring and was treated conservatively, to return if symptoms got worse. Presented today to [facility] clinic with increasing pain, and swelling and chills, joint aspiration was attempted- But no fluid was aspirated.   Prescriptions prior to admission  Medication Sig Dispense Refill  . aspirin EC 81 MG tablet Take 81 mg by mouth daily.      . bimatoprost (LUMIGAN) 0.01 % SOLN Place 1 drop into the right eye at bedtime.      . Difluprednate (DUREZOL) 0.05 % EMUL Place 1  drop into both eyes 2 (two) times daily.       . dorzolamide (TRUSOPT) 2 % ophthalmic solution Place 1 drop into both eyes 3 (three) times daily.      . furosemide (LASIX) 40 MG tablet Take 40 mg by mouth daily.      . metoprolol tartrate (LOPRESSOR) 25 MG tablet Take 25 mg by mouth 2 (two) times daily.      . potassium chloride SA (K-DUR,KLOR-CON) 20 MEQ tablet Take 20 mEq by mouth 2 (two) times daily.      . simvastatin (ZOCOR) 40 MG tablet Take 40 mg by mouth at bedtime.        Allergies: Allergies as of 07/13/2013 - Review Complete 07/13/2013  Allergen Reaction Noted  . Ace inhibitors    . Fosinopril sodium     Past Medical History  Diagnosis Date  . Hypertension   . Hyperlipidemia   . Osteoarthritis   . Erectile dysfunction   . Chronic venous insufficiency   . CHF 8099    Systolic failure. Cardiac cath in 2004: The LV showed mild global hypokinesia. EF of 45-50%. LVEDP was 12 mmHg. Left main was long which was patent. LAD has 10% distal stenosis. The vessel proximally was very tortuous. Diagonal one was small which was patent. Left circumflex was patent proximally and in mid portion and then tapers down in AV groove after giving off OM-2. OM-1 was  large which was p  . Seborrheic keratoses     Multiple. Sees Lyndle Herrlich   Past Surgical History  Procedure Laterality Date  . Hernia repair     History reviewed. No pertinent family history. History   Social History  . Marital Status: Married    Spouse Name: N/A    Number of Children: N/A  . Years of Education: N/A   Occupational History  . Not on file.   Social History Main Topics  . Smoking status: Former Smoker    Quit date: 03/16/1988  . Smokeless tobacco: Not on file  . Alcohol Use: No     Comment: Quit 1999, h/o DT's and szs. No ETOH since 1999  . Drug Use: No  . Sexual Activity: Not on file   Other Topics Concern  . Not on file   Social History Narrative  . No narrative on file    Review of  Systems: CONSTITUTIONAL- No Fever, weightloss, night sweat, or changes in appetite. HEAD- Headache, dizziness. RESPIRATORY- No Cough, or SOB. CARDIAC- Palpitations, DOE, PND, chest pain. GI- No vomiting, diarrhoea, constipation, or abd pain. URINARY- Frequency, polyuria, nocturia, hesitancy, urgency,incontinence NEUROLOGIC- Numbness, syncope, burning. Skin- Chronic changes in lower extremities.  Physical Exam: Blood pressure 143/79, pulse 87, temperature 99.1 F (37.3 C), temperature source Oral, resp. rate 16, height 6\' 1"  (1.854 m), weight 189 lb (85.73 kg), SpO2 99.00%. GENERAL- alert, co-operative, appears as stated age, not in any distress. HEENT- Atraumatic, normocephalic, PERRL, EOMI, oral mucosa appears moist, overall poor dentition. CARDIAC- RRR, no murmurs, rubs or gallops. RESP- Moving equal volumes of air, and clear to auscultation bilaterally. ABDOMEN- Soft,non tender, no palpable masses or organomegaly, bowel sounds present. BACK- Normal curvature of the spine, No tenderness along the vertebrae, no CVA tenderness. NEURO- OTPP, No obvious Cr N abnormality,  EXTREMITIES- Left Upper extremity- swelling of left wrist, appears abit tense and shiny,swelling extending from distal part of the hand- Dorsal surface to proximal third of wrist. Restricted range of motion- wrist flexion and flexion of fingers, due to pain and swelling, Skin cool to touch and radial pulse present. Lower extremities- Chronic swelling and dry skin bilat, swelling extending from the feet to upper legs.    SKIN- Warm, dry. PSYCH- Normal mood and affect, appropriate thought content and speech.  Lab results: Basic Metabolic Panel:  Recent Labs  07/13/13 1640  NA 138  K 4.3  CL 101  CO2 21  GLUCOSE 119*  BUN 13  CREATININE 1.02  CALCIUM 9.3   Liver Function Tests:  Recent Labs  07/13/13 1640  AST 15  ALT 11  ALKPHOS 59  BILITOT 0.9  PROT 8.0  ALBUMIN 3.5   CBC:  Recent Labs   07/13/13 1640  WBC 8.4  NEUTROABS 5.0  HGB 12.3*  HCT 36.4*  MCV 92.4  PLT 218   Imaging results:  Dg Wrist Complete Left  07/12/2013   CLINICAL DATA:  Left wrist swelling  EXAM: LEFT WRIST - COMPLETE 3+ VIEW  COMPARISON:  None.  FINDINGS: Moderate degenerative change at the base of the thumb with joint space narrowing and spurring. Moderate degenerative change in the radiocarpal joint with joint space narrowing and spurring. Degenerative change in the distal radioulnar joint.  Negative for fracture or erosion.  Arterial calcification noted.  IMPRESSION: Moderate degenerative change as above.  Negative for fracture.   Electronically Signed   By: Franchot Gallo M.D.   On: 07/12/2013 12:07    Other results:  EKG: No EKG.  Assessment & Plan by Problem:  Swelling of Joint- Left- Possible etiology- septic arthritis- Pain, swelling, chills, with high temp of 100.3 on admission, also reduced range of motion of joint makes joint imviolvement likely. Though wrist is cool touch but pt had already received Norco, documented in clinic that wrist was warm to touch, no leukocytosis, but Will order imaging to r/o out Infection, initial xray neg. Possibly cellulitis- Imaging results so far negative for bone involvement.  Pseudo gout possible- considering this commonly involves the wrist causing a wrist synovitis, but attempts to tap the joints yielded a dry tap, await imaging results. Gout unlikely as this usually involves the 1st metatarso-phalangeal joint. Neither of this can be completely ruled out without joint aspiration. Doubt reactive arthritis- as No Genitourinary or gastrointestinal symptoms, and no oligoarthritis.  - Admit to [facility] pending Ortho recs. - NPO after midnight - Norco Q4H PRN  - CBC in the am. - Cont IV antibiotics- Iv Clindamycin 600mg  Q8H. - Blood Cultures  HTN- Blood pressure stable, home meds- Metoprolol 40mg  daily, and Furosemide 40mg  daily. -  Continue on admission.  Dispo: Disposition is deferred at this time, awaiting improvement of current medical problems.   The patient does have a current PCP Dominic Pea, DO) and does need an [facility] hospital follow-up appointment after discharge.  The patient does not know have transportation limitations that hinder transportation to clinic appointments.  Signed: Jenetta Downer, MD 07/13/2013, 10:23 PM

## 2013-07-13 NOTE — Patient Instructions (Signed)
Admit to the teaching service.

## 2013-07-13 NOTE — Progress Notes (Signed)
Subjective:   Patient ID: Timothy Bradford male   DOB: 26-Sep-1935 78 y.o.   MRN: [id number]  HPI: TimothyAbdulaziz Bradford is a 78 y.o. gentleman with PMH significant for HTN, HLD, Systolic Heart failure comes to the office with chief complaints of left wrist swelling and pain. The history is obtained from the patient and his wife.   Patient reports that the pain and swelling started about a week ago, gradually worsening. He reports a fever of 100.88F a week ago. He reports chills and denies body pains. Patient was seen here in the clinic yesterday by Dr. Murlean Caller and that the pain and swelling were seemed to be resolving at the time. X-rays of the left wrist were obtained that revealed Moderate degenerative changes at the base of the thumb with joint space narrowing and spurring and was treated conservatively. But he returns today to the office with worsening of left wrist swelling with pain along with chills.   Patient has multiple seborrheic keratoses lesions extending all the way from upper back to lower back. He is being followed with dermatology and he underwent removal of one of the lesions in 2014 and the pathology revealed Sq.Cell Ca in Situ and is being followed with dermatology. His wife states that she does dressing for him over the lesion that was removed and did not notice any pus or does not inflamed.   Patient denies any other symptoms.   Past Medical History  Diagnosis Date  . Hypertension   . Hyperlipidemia   . Osteoarthritis   . Erectile dysfunction   . Chronic venous insufficiency   . CHF 9323    Systolic failure. Cardiac cath in 2004: The LV showed mild global hypokinesia. EF of 45-50%. LVEDP was 12 mmHg. Left main was long which was patent. LAD has 10% distal stenosis. The vessel proximally was very tortuous. Diagonal one was small which was patent. Left circumflex was patent proximally and in mid portion and then tapers down in AV groove after giving off OM-2. OM-1 was large which was p    . Seborrheic keratoses     Multiple. Sees Lyndle Herrlich   Current Outpatient Prescriptions  Medication Sig Dispense Refill  . aspirin 81 MG tablet Take 81 mg by mouth daily.      . bimatoprost (LUMIGAN) 0.01 % SOLN Place 1 drop into the right eye at bedtime.      . Difluprednate (DUREZOL) 0.05 % EMUL Apply 1 drop to eye 2 (two) times daily.      . dorzolamide (TRUSOPT) 2 % ophthalmic solution Place 1 drop into both eyes 3 (three) times daily.      . furosemide (LASIX) 40 MG tablet TAKE 1 TABLET BY MOUTH EVERY DAY  90 tablet  1  . KLOR-CON M20 20 MEQ tablet TAKE 1 TABLET BY MOUTH TWICE A DAY.  90 tablet  1  . metoprolol tartrate (LOPRESSOR) 25 MG tablet Take 1 tablet (25 mg total) by mouth 2 (two) times daily.  180 tablet  3  . simvastatin (ZOCOR) 40 MG tablet TAKE 1 TABLET BY MOUTH EVERY DAY  90 tablet  1  . zoster vaccine live, PF, (ZOSTAVAX) 55732 UNT/0.65ML injection Inject 19,400 Units into the skin once.  1 each  0  . [DISCONTINUED] potassium chloride (KLOR-CON) 20 MEQ packet Take 1 tablet by mouth twice a day.  90 tablet  3   No current facility-administered medications for this visit.   No family history on file. History  Social History  . Marital Status: Married    Spouse Name: N/A    Number of Children: N/A  . Years of Education: N/A   Social History Main Topics  . Smoking status: Former Smoker    Quit date: 03/16/1988  . Smokeless tobacco: None  . Alcohol Use: No     Comment: Quit 1999, h/o DT's and szs. No ETOH since 1999  . Drug Use: No  . Sexual Activity: None   Other Topics Concern  . None   Social History Narrative  . None   Review of Systems: Pertinent items are noted in HPI. Objective:  Physical Exam: Filed Vitals:   07/13/13 1511  BP: 131/77  Pulse: 89  Temp: 100.3 F (37.9 C)  TempSrc: Oral  Weight: 189 lb 1.6 oz (85.775 kg)  SpO2: 98%   Vital signs reviewed.   Patient is a well-developed and well-nourished and is not in acute distress and  cooperative with exam. Alert and oriented x3.  Head: Normocephalic and atraumatic Mouth: no erythema or exudates, MMM Neck: Supple, Trachea midline normal ROM, No JVD, mass, thyromegaly, or carotid bruit present.  Cardiovascular: RRR, S1 normal, S2 normal, no MRG Pulmonary/Chest: normal respiratory effort, CTAB, no wheezes, rales, or rhonchi Abdominal: Soft. Non-tender, non-distended, bowel sounds are normal, no masses, organomegaly, or guarding present.  Musculoskeletal:  Inspection: Left Wrist joint is swollen diffusely but most notable in the dorsal aspect. The swelling extends both superiorly (into dorsal forearm) and inferioly (into the dorsal aspect of hand).Skin over the dorsal left wrist appears inflamed (despite dark complexion). No visible bite marks,stings or excoriation noted over the left wrist. The left wrist is warm to touch, edematous and tender. All the joint movements are moderately restricted secondary to pain.  Hematology: no cervical, inginal, or axillary adenopathy.  Neurological: A&O x3. Skin: Multiple Seborrheic keratosis lesion noted over the back and chest and abdomen. Surgical removal of one of the lesions was noted over the left upper back around the suprascapular area. The surgical looks healthy and healing well. No signs of pus or drainage noted.   Psychiatric: Normal mood and affect. speech and behavior is normal. Judgment and thought content normal. Cognition and memory are normal.    Assessment & Plan:

## 2013-07-13 NOTE — Progress Notes (Signed)
ANTIBIOTIC CONSULT NOTE - INITIAL  Pharmacy Consult for Clindamycin  Indication: Cellulitis  Allergies  Allergen Reactions  . Ace Inhibitors     REACTION: "angioedema"cited on chart cover  . Fosinopril Sodium     Patient Measurements: Height: 6\' 1"  (185.4 cm) Weight: 189 lb (85.73 kg) IBW/kg (Calculated) : 79.9  Vital Signs: Temp: 98 F (36.7 C) (04/10 1742) Temp src: Oral (04/10 1742) BP: 135/84 mmHg (04/10 1742) Pulse Rate: 76 (04/10 1742) Intake/Output from previous day:   Intake/Output from this shift:    Labs:  Recent Labs  07/13/13 1640  WBC 8.4  HGB 12.3*  PLT 218  CREATININE 1.02   Estimated Creatinine Clearance: 70.7 ml/min (by C-G formula based on Cr of 1.02). No results found for this basename: VANCOTROUGH, VANCOPEAK, VANCORANDOM, GENTTROUGH, GENTPEAK, GENTRANDOM, TOBRATROUGH, TOBRAPEAK, TOBRARND, AMIKACINPEAK, AMIKACINTROU, AMIKACIN,  in the last 72 hours   Microbiology: No results found for this or any previous visit (from the past 720 hour(s)).  Medical History: Past Medical History  Diagnosis Date  . Hypertension   . Hyperlipidemia   . Osteoarthritis   . Erectile dysfunction   . Chronic venous insufficiency   . CHF 2426    Systolic failure. Cardiac cath in 2004: The LV showed mild global hypokinesia. EF of 45-50%. LVEDP was 12 mmHg. Left main was long which was patent. LAD has 10% distal stenosis. The vessel proximally was very tortuous. Diagonal one was small which was patent. Left circumflex was patent proximally and in mid portion and then tapers down in AV groove after giving off OM-2. OM-1 was large which was p  . Seborrheic keratoses     Multiple. Sees Lyndle Herrlich    Assessment: 78 y.o. M who presented with L-wrist pain and swelling that has worsened over the past week and is associated with fevers and chills. Pharmacy has been consulted to start clindamyin for empiric L-wrist cellulitis coverage. Temp 100.3, WBC wnl.   Goal of  Therapy:  Proper antibiotics for infection/cultures adjusted for renal/hepatic function   Plan:  1. Clindamycin 600 mg IV every 8 hours 2. Pharmacy will sign off of dosing protocol as there are no expected dose adjustments at this time.  Alycia Rossetti, PharmD, BCPS Clinical Pharmacist Pager: [phone] 07/13/2013 7:37 PM

## 2013-07-14 LAB — BASIC METABOLIC PANEL
BUN: 13 mg/dL (ref 6–23)
CO2: 19 mEq/L (ref 19–32)
Calcium: 8.9 mg/dL (ref 8.4–10.5)
Chloride: 102 mEq/L (ref 96–112)
Creatinine, Ser: 0.97 mg/dL (ref 0.50–1.35)
GFR, EST NON AFRICAN AMERICAN: 79 mL/min — AB (ref >90)
Glucose, Bld: 104 mg/dL — ABNORMAL HIGH (ref 70–99)
POTASSIUM: 4 meq/L (ref 3.7–5.3)
SODIUM: 138 meq/L (ref 137–147)

## 2013-07-14 LAB — CBC
HCT: 32.9 % — ABNORMAL LOW (ref 39.0–52.0)
HEMOGLOBIN: 11.3 g/dL — AB (ref 13.0–17.0)
MCH: 31.7 pg (ref 26.0–34.0)
MCHC: 34.3 g/dL (ref 30.0–36.0)
MCV: 92.2 fL (ref 78.0–100.0)
Platelets: 216 10*3/uL (ref 150–400)
RBC: 3.57 MIL/uL — AB (ref 4.22–5.81)
RDW: 13 % (ref 11.5–15.5)
WBC: 7.9 10*3/uL (ref 4.0–10.5)

## 2013-07-14 LAB — C-REACTIVE PROTEIN: CRP: 18 mg/dL — AB (ref <0.60)

## 2013-07-14 LAB — SEDIMENTATION RATE: Sed Rate: 105 mm/hr — ABNORMAL HIGH (ref 0–16)

## 2013-07-14 MED ORDER — CLINDAMYCIN PHOSPHATE 600 MG/50ML IV SOLN
600.0000 mg | Freq: Three times a day (TID) | INTRAVENOUS | Status: DC
  Administered 2013-07-14 – 2013-07-15 (×3): 600 mg via INTRAVENOUS
  Filled 2013-07-14 (×5): qty 50

## 2013-07-14 MED ORDER — SULFAMETHOXAZOLE-TMP DS 800-160 MG PO TABS
1.0000 | ORAL_TABLET | Freq: Two times a day (BID) | ORAL | Status: DC
  Filled 2013-07-14 (×2): qty 1

## 2013-07-14 NOTE — Progress Notes (Signed)
Utilization Review Completed.  

## 2013-07-14 NOTE — H&P (Signed)
Internal Medicine Attending Admission Note Date: 07/14/2013  Patient name: Timothy Bradford Medical record number: [id number] Date of birth: 07-Jan-1936 Age: 78 y.o. Gender: male  I saw and evaluated the patient. I reviewed the resident's note and I agree with the resident's findings and plan as documented in the resident's note, with the following additional comments.  Chief Complaint(s): Pain and swelling of left hand/wrist and left forearm   History - key components related to admission: Patient is a 78 year old man with history of hypertension, hyperlipidemia, systolic heart failure, osteoarthritis, and other problems as outlined in the medical history, admitted through the outpatient clinic with complaint of swelling and pain of his left hand and wrist, which has progressed overnight to his left forearm.   Physical Exam - key components related to admission:  Filed Vitals:   07/13/13 1742 07/13/13 2212 07/14/13 0703  BP: 135/84 143/79 139/72  Pulse: 76 87 79  Temp: 98 F (36.7 C) 99.1 F (37.3 C) 99.1 F (37.3 C)  TempSrc: Oral Oral Oral  Resp: 16 16 16   Height: 6\' 1"  (1.854 m)    Weight: 189 lb (85.73 kg)    SpO2: 100% 99% 97%   General: Alert, no distress Lungs: Clear Heart: Regular; no extra sounds or murmurs Abdomen: Bowel sounds present, soft, nontender Extremities: 1+ bilateral lower extremity edema.  The left hand, wrist, and forearm are warm, tender, and swollen. Pulses: The left radial pulse is intact  Lab results:   Basic Metabolic Panel:  Recent Labs  07/13/13 1640 07/14/13 0633  NA 138 138  K 4.3 4.0  CL 101 102  CO2 21 19  GLUCOSE 119* 104*  BUN 13 13  CREATININE 1.02 0.97  CALCIUM 9.3 8.9    Liver Function Tests:  Recent Labs  07/13/13 1640  AST 15  ALT 11  ALKPHOS 59  BILITOT 0.9  PROT 8.0  ALBUMIN 3.5     CBC:  Recent Labs  07/13/13 1640 07/14/13 0633  WBC 8.4 7.9  HGB 12.3* 11.3*  HCT 36.4* 32.9*  MCV 92.4 92.2  PLT 218 216     Recent Labs  07/13/13 1640  NEUTROABS 5.0  LYMPHSABS 1.7  MONOABS 1.8*  EOSABS 0.0  BASOSABS 0.0      Urinalysis    Component Value Date/Time   COLORURINE YELLOW 12/27/2008 0118   APPEARANCEUR CLEAR 12/27/2008 0118   LABSPEC 1.015 12/27/2008 0118   PHURINE 5.5 12/27/2008 0118   GLUCOSEU NEG mg/dL 12/27/2008 0118   GLUCOSEU NEGATIVE 12/11/2008 1930   HGBUR LARGE* 12/11/2008 1930   BILIRUBINUR NEG 12/27/2008 0118   KETONESUR NEG mg/dL 12/27/2008 0118   PROTEINUR NEG mg/dL 12/27/2008 0118   UROBILINOGEN 0.2 12/27/2008 0118   NITRITE NEG 12/27/2008 0118   LEUKOCYTESUR NEG 12/27/2008 0118     Imaging results:  Dg Wrist 2 Views Left  07/14/2013   CLINICAL DATA:  Left wrist pain and swelling without injury.  EXAM: LEFT WRIST - 2 VIEW  COMPARISON:  July 12, 2013.  FINDINGS: No fracture or dislocation is noted. Narrowing and sclerosis of the first carpometacarpal joint is noted as well as the radial carpal joint consistent with degenerative joint disease. Vascular calcifications are noted.  IMPRESSION: Degenerative changes as described above. No acute abnormality seen in the left wrist.   Electronically Signed   By: Sabino Dick M.D.   On: 07/14/2013 07:38   Dg Wrist Complete Left  07/12/2013   CLINICAL DATA:  Left wrist swelling  EXAM: LEFT WRIST -  COMPLETE 3+ VIEW  COMPARISON:  None.  FINDINGS: Moderate degenerative change at the base of the thumb with joint space narrowing and spurring. Moderate degenerative change in the radiocarpal joint with joint space narrowing and spurring. Degenerative change in the distal radioulnar joint.  Negative for fracture or erosion.  Arterial calcification noted.  IMPRESSION: Moderate degenerative change as above.  Negative for fracture.   Electronically Signed   By: Franchot Gallo M.D.   On: 07/12/2013 12:07     Assessment & Plan by Problem:  1.  Probable cellulitis of left hand and forearm.  Plan is empiric IV clindamycin pending results of blood cultures  and clinical improvement; pain control; orthopedic surgeon Dr. Percell Miller saw patient in consultation and did not feel that patient has a septic joint.  2.  Hypertension.  Continue home antihypertensive regimen.  3.  Other problems and plans as per the resident physician's note.

## 2013-07-14 NOTE — Progress Notes (Addendum)
Subjective: Patient seen and examined at the bedside this morning. Reports subjective improvement in swelling and pain overnight with IV antibiotics. He notes some swelling around his elbow, but admits this may have been present yesterday as well. Denies fever, chills, chest pain, SOB. No overnight events.  Objective: Vital signs in last 24 hours: Filed Vitals:   07/13/13 1742 07/13/13 2212 07/14/13 0703  BP: 135/84 143/79 139/72  Pulse: 76 87 79  Temp: 98 F (36.7 C) 99.1 F (37.3 C) 99.1 F (37.3 C)  TempSrc: Oral Oral Oral  Resp: '16 16 16  ' Height: '6\' 1"'  (1.854 m)    Weight: 189 lb (85.73 kg)    SpO2: 100% 99% 97%   Weight change:   Intake/Output Summary (Last 24 hours) at 07/14/13 1013 Last data filed at 07/13/13 2200  Gross per 24 hour  Intake    360 ml  Output      0 ml  Net    360 ml    Physical Exam:  GENERAL- alert, co-operative, appears as stated age, not in any distress.  HEENT- Atraumatic, normocephalic, PERRL, EOMI, oral mucosa appears moist, overall poor dentition.  CARDIAC- RRR, no murmurs, rubs or gallops.  RESP- Moving equal volumes of air, and clear to auscultation bilaterally.  ABDOMEN- Soft,non tender, no palpable masses or organomegaly, bowel sounds present.  BACK- Normal curvature of the spine, No tenderness along the vertebrae, no CVA tenderness.  NEURO- OTPP, No obvious Cr N abnormality,  EXTREMITIES- Left Upper extremity- swelling of left wrist, appears abit tense and shiny,swelling extending from distal part of the hand- Dorsal surface to proximal third of wrist. Range of motion- wrist flexion and flexion of fingers, intact but limited due to swelling, no significant increase in pain with ROM. Skin warm, but arm was under covers prior to exam. Radial pulse present.   Lab Results: Basic Metabolic Panel:  Recent Labs Lab 07/13/13 1640 07/14/13 0633  NA 138 138  K 4.3 4.0  CL 101 102  CO2 21 19  GLUCOSE 119* 104*  BUN 13 13  CREATININE  1.02 0.97  CALCIUM 9.3 8.9   Liver Function Tests:  Recent Labs Lab 07/13/13 1640  AST 15  ALT 11  ALKPHOS 59  BILITOT 0.9  PROT 8.0  ALBUMIN 3.5   CBC:  Recent Labs Lab 07/13/13 1640 07/14/13 0633  WBC 8.4 7.9  NEUTROABS 5.0  --   HGB 12.3* 11.3*  HCT 36.4* 32.9*  MCV 92.4 92.2  PLT 218 216    Micro Results: No results found for this or any previous visit (from the past 240 hour(s)). Studies/Results: Dg Wrist 2 Views Left  07/14/2013   CLINICAL DATA:  Left wrist pain and swelling without injury.  EXAM: LEFT WRIST - 2 VIEW  COMPARISON:  July 12, 2013.  FINDINGS: No fracture or dislocation is noted. Narrowing and sclerosis of the first carpometacarpal joint is noted as well as the radial carpal joint consistent with degenerative joint disease. Vascular calcifications are noted.  IMPRESSION: Degenerative changes as described above. No acute abnormality seen in the left wrist.   Electronically Signed   By: Sabino Dick M.D.   On: 07/14/2013 07:38   Dg Wrist Complete Left  07/12/2013   CLINICAL DATA:  Left wrist swelling  EXAM: LEFT WRIST - COMPLETE 3+ VIEW  COMPARISON:  None.  FINDINGS: Moderate degenerative change at the base of the thumb with joint space narrowing and spurring. Moderate degenerative change in the radiocarpal joint  with joint space narrowing and spurring. Degenerative change in the distal radioulnar joint.  Negative for fracture or erosion.  Arterial calcification noted.  IMPRESSION: Moderate degenerative change as above.  Negative for fracture.   Electronically Signed   By: Franchot Gallo M.D.   On: 07/12/2013 12:07   Medications: I have reviewed the patient's current medications. Scheduled Meds: . aspirin EC  81 mg Oral Daily  . Difluprednate  1 drop Both Eyes BID  . dorzolamide  1 drop Both Eyes TID  . heparin  5,000 Units Subcutaneous 3 times per day  . latanoprost  1 drop Right Eye QHS  . metoprolol tartrate  25 mg Oral BID  .  sulfamethoxazole-trimethoprim  1 tablet Oral Q12H   Continuous Infusions:  PRN Meds:.HYDROcodone-acetaminophen  Assessment/Plan: #Swelling of Joint- Left - S/p dry tap. Appearance stable this morning on IV antibiotics, he reported subjective improvement to me and ortho. Still with some pain with palpation. No systemic signs of infections, afebrile, no leukocytosis. Repeat XR unremarkable, showing only chronic degenerative changes. - Appreciate ortho recs, Dr. Percell Miller does not feel this is septic arthritis, no surgical intervention needed, recommend outpatient follow up with a hand orthopedist given degenerative disease - Pain control with Norco Q4H PRN  - Continue IV clindamycin (antibiotic day 2/10) pending clinical improvement - Blood cultures x2 (4/10) > In process - ESR, CRP - CBC in am  #HTN - Normotensive, continue home Metoprolol 41m daily, holding home Furosemide 466mdaily.   #DVT PPX - Heparin subq.   Dispo: Anticipated discharge in approximately 0-1 day(s).   The patient does have a current PCP (ADominic PeaDO) and [facility].  The patient does not have transportation limitations that hinder transportation to clinic appointments.  .Services Needed at time of discharge: Y = Yes, Blank = No PT:   OT:   RN:   Equipment:   Other:     LOS: 1 day   SaLesly DukesMD 07/14/2013, 10:13 AM  SaLesly DukesMD  [email or web address] Pager # [phone] hours and weekends # [phone] # [phone]

## 2013-07-15 ENCOUNTER — Observation Stay (HOSPITAL_COMMUNITY): Payer: Medicare Other

## 2013-07-15 DIAGNOSIS — I10 Essential (primary) hypertension: Secondary | ICD-10-CM

## 2013-07-15 DIAGNOSIS — M25439 Effusion, unspecified wrist: Secondary | ICD-10-CM

## 2013-07-15 LAB — SYNOVIAL CELL COUNT + DIFF, W/ CRYSTALS
EOSINOPHILS-SYNOVIAL: 0 % (ref 0–1)
Lymphocytes-Synovial Fld: 3 % (ref 0–20)
Monocyte-Macrophage-Synovial Fluid: 8 % — ABNORMAL LOW (ref 50–90)
NEUTROPHIL, SYNOVIAL: 89 % — AB (ref 0–25)
WBC, Synovial: 38856 /mm3 — ABNORMAL HIGH (ref 0–200)

## 2013-07-15 LAB — CBC
HCT: 33.3 % — ABNORMAL LOW (ref 39.0–52.0)
HEMOGLOBIN: 11.2 g/dL — AB (ref 13.0–17.0)
MCH: 30.9 pg (ref 26.0–34.0)
MCHC: 33.6 g/dL (ref 30.0–36.0)
MCV: 92 fL (ref 78.0–100.0)
Platelets: 224 10*3/uL (ref 150–400)
RBC: 3.62 MIL/uL — ABNORMAL LOW (ref 4.22–5.81)
RDW: 12.9 % (ref 11.5–15.5)
WBC: 7.8 10*3/uL (ref 4.0–10.5)

## 2013-07-15 LAB — GLUCOSE, SYNOVIAL FLUID: GLUCOSE, SYNOVIAL FLUID: 64 mg/dL

## 2013-07-15 LAB — GRAM STAIN

## 2013-07-15 MED ORDER — INDOMETHACIN 50 MG PO CAPS: 50.0000 mg | ORAL_CAPSULE | Freq: Three times a day (TID) | ORAL | Status: DC

## 2013-07-15 MED ORDER — VANCOMYCIN HCL IN DEXTROSE 1-5 GM/200ML-% IV SOLN
1000.0000 mg | Freq: Two times a day (BID) | INTRAVENOUS | Status: DC
  Filled 2013-07-15: qty 200

## 2013-07-15 MED ORDER — PREDNISONE 20 MG PO TABS
40.0000 mg | ORAL_TABLET | Freq: Every day | ORAL | Status: DC
  Filled 2013-07-15: qty 2

## 2013-07-15 MED ORDER — VANCOMYCIN HCL 10 G IV SOLR
1250.0000 mg | Freq: Once | INTRAVENOUS | Status: AC
  Administered 2013-07-15: 1250 mg via INTRAVENOUS
  Filled 2013-07-15: qty 1250

## 2013-07-15 MED ORDER — INDOMETHACIN 50 MG PO CAPS
50.0000 mg | ORAL_CAPSULE | Freq: Three times a day (TID) | ORAL | Status: DC
Start: 2013-07-15 — End: 2013-07-16
  Administered 2013-07-15 – 2013-07-16 (×3): 50 mg via ORAL
  Filled 2013-07-15 (×7): qty 1

## 2013-07-15 NOTE — Progress Notes (Signed)
Internal Medicine Attending  Date: 07/15/2013  Patient name: Timothy Bradford Medical record number: [id number] Date of birth: 1936/02/28 Age: 78 y.o. Gender: male  I saw and evaluated the patient. I reviewed the resident's note by Dr. Eulas Post and I agree with the resident's findings and plans as documented in her note.

## 2013-07-15 NOTE — Progress Notes (Signed)
ANTIBIOTIC CONSULT NOTE - INITIAL  Pharmacy Consult for Vancomycin Indication: Cellulitis  Allergies  Allergen Reactions  . Ace Inhibitors     REACTION: "angioedema"cited on chart cover  . Fosinopril Sodium     Patient Measurements: Height: '6\' 1"'  (185.4 cm) Weight: 189 lb (85.73 kg) IBW/kg (Calculated) : 79.9  Vital Signs: Temp: 98.4 F (36.9 C) (04/12 0603) Temp src: Oral (04/12 0603) BP: 111/63 mmHg (04/12 0603) Pulse Rate: 73 (04/12 0603) Intake/Output from previous day: 04/11 0701 - 04/12 0700 In: 480 [P.O.:480] Out: -  Intake/Output from this shift:    Labs:  Recent Labs  07/13/13 1640 07/14/13 0633 07/15/13 0623  WBC 8.4 7.9 7.8  HGB 12.3* 11.3* 11.2*  PLT 218 216 224  CREATININE 1.02 0.97  --    Estimated Creatinine Clearance: 74.4 ml/min (by C-G formula based on Cr of 0.97). No results found for this basename: VANCOTROUGH, Corlis Leak, VANCORANDOM, GENTTROUGH, GENTPEAK, GENTRANDOM, TOBRATROUGH, TOBRAPEAK, TOBRARND, AMIKACINPEAK, AMIKACINTROU, AMIKACIN,  in the last 72 hours   Microbiology: Recent Results (from the past 720 hour(s))  CULTURE, BLOOD (SINGLE)     Status: None   Collection Time    07/13/13  4:40 PM      Result Value Ref Range Status   Preliminary Report Blood Culture received; No Growth to date;   Preliminary   Preliminary Report Culture will be held for 5 days before issuing   Preliminary   Preliminary Report a Final Negative report.   Preliminary    Medical History: Past Medical History  Diagnosis Date  . Hypertension   . Hyperlipidemia   . Osteoarthritis   . Erectile dysfunction   . Chronic venous insufficiency   . CHF 3291    Systolic failure. Cardiac cath in 2004: The LV showed mild global hypokinesia. EF of 45-50%. LVEDP was 12 mmHg. Left main was long which was patent. LAD has 10% distal stenosis. The vessel proximally was very tortuous. Diagonal one was small which was patent. Left circumflex was patent proximally and in mid  portion and then tapers down in AV groove after giving off OM-2. OM-1 was large which was p  . Seborrheic keratoses     Multiple. Sees Lyndle Herrlich    Medications:  Scheduled:  . aspirin EC  81 mg Oral Daily  . clindamycin (CLEOCIN) IV  600 mg Intravenous 3 times per day  . Difluprednate  1 drop Both Eyes BID  . dorzolamide  1 drop Both Eyes TID  . heparin  5,000 Units Subcutaneous 3 times per day  . indomethacin  50 mg Oral TID WC  . latanoprost  1 drop Right Eye QHS  . metoprolol tartrate  25 mg Oral BID   Infusions:   Assessment: 78 yo M presents with worsening L wrist pain, and swelling. Pt initially started on clinda IV, however pt had a fever last night of 101.9. Pharmacy is dosing vancomycin to help broaden coverage. Patient has elevated ESR and CPR, but wbc wnl. Patient has stable renal function.   Goal of Therapy:  Eradicate Infection Goal Vanc trough 10-15  Plan:  - Load Vanc with 1.25g IV x1 followed by Vanc 1g IV q12h - Monitor cultures, renal function and clinical progression - Follow up on vancomycin trough when indicated   Angelica Chessman 07/15/2013,9:47 AM

## 2013-07-15 NOTE — Progress Notes (Signed)
Subjective: Patient reports worsening of his swelling and pain overnight despite IV antibiotics. He notes increased swelling and pain around his elbow with decreased ROM at the elbow and at the wrist. Denies fever, chills, chest pain, SOB.   Objective: Vital signs in last 24 hours: Filed Vitals:   07/14/13 0703 07/14/13 1500 07/14/13 2241 07/15/13 0603  BP: 139/72 103/58 128/61 111/63  Pulse: 79 86 90 73  Temp: 99.1 F (37.3 C) 99.7 F (37.6 C) 101.9 F (38.8 C) 98.4 F (36.9 C)  TempSrc: Oral Oral Oral Oral  Resp: _0 Height:      Weight:      SpO2: 97% 95% 97% 96%   Weight change:   Intake/Output Summary (Last 24 hours) at 07/15/13 1201 Last data filed at 07/15/13 0604  Gross per 24 hour  Intake    480 ml  Output      0 ml  Net    480 ml    Physical Exam:  Vitals reviewed. General: Sitting on the side of the bed eating breakfast, NAD HEENT: PERRL, EOMI Cardiac: RRR, no rubs, murmurs or gallops Pulm: clear to auscultation bilaterally, no wheezes, rales, or rhonchi Abd: soft, nontender, nondistended Ext: Left Upper extremity w/ increased swelling, warmth, and erythema of left wrist, with decreased ROM and pain with motion. Increased swelling at the left elbow with warmth, tenderness, and significantly decreased. Radial pulse present. RUE wnl. Neuro: Alert and oriented X3, cranial nerves II-XII grossly intact, no focal neurologic deficits   Lab Results: Basic Metabolic Panel:  Recent Labs Lab 07/13/13 1640 07/14/13 0633  NA 138 138  K 4.3 4.0  CL 101 102  CO2 21 19  GLUCOSE 119* 104*  BUN 13 13  CREATININE 1.02 0.97  CALCIUM 9.3 8.9   Liver Function Tests:  Recent Labs Lab 07/13/13 1640  AST 15  ALT 11  ALKPHOS 59  BILITOT 0.9  PROT 8.0  ALBUMIN 3.5   CBC:  Recent Labs Lab 07/13/13 1640 07/14/13 0633 07/15/13 0623  WBC 8.4 7.9 7.8  NEUTROABS 5.0  --   --   HGB 12.3* 11.3* 11.2*  HCT 36.4* 32.9* 33.3*  MCV 92.4 92.2 92.0  PLT  218 216 224    Micro Results: Recent Results (from the past 240 hour(s))  CULTURE, BLOOD (SINGLE)     Status: None   Collection Time    07/13/13  4:40 PM      Result Value Ref Range Status   Preliminary Report Blood Culture received; No Growth to date;   Preliminary   Preliminary Report Culture will be held for 5 days before issuing   Preliminary   Preliminary Report a Final Negative report.   Preliminary   Studies/Results: Dg Elbow Complete Left  07/15/2013   CLINICAL DATA:  Awoke on Friday with lateral elbow pain and limited range of motion.  EXAM: LEFT ELBOW - COMPLETE 3+ VIEW  COMPARISON:  None.  FINDINGS: The lateral radiograph is degraded due to obliquity. No definite displaced fracture or elbow joint effusion. Mild to moderate degenerative change of the elbow with joint space loss, subchondral sclerosis and osteophytosis, most conspicuously involving the ulna. No definite erosions. There is minimal enthesopathic change of the triceps tendon insertion site. There is potential mild diffuse soft tissue swelling primarily involving the posterior aspect of the elbow without associated area of osteolysis or radiopaque foreign body.  IMPRESSION: 1. Mild diffuse soft tissue swelling about the elbow without associated fracture,  joint effusion or radiopaque foreign body. 2. Mild to moderate degenerative change of the elbow.   Electronically Signed   By: Sandi Mariscal M.D.   On: 07/15/2013 09:35   Dg Wrist 2 Views Left  07/14/2013   CLINICAL DATA:  Left wrist pain and swelling without injury.  EXAM: LEFT WRIST - 2 VIEW  COMPARISON:  July 12, 2013.  FINDINGS: No fracture or dislocation is noted. Narrowing and sclerosis of the first carpometacarpal joint is noted as well as the radial carpal joint consistent with degenerative joint disease. Vascular calcifications are noted.  IMPRESSION: Degenerative changes as described above. No acute abnormality seen in the left wrist.   Electronically Signed   By:  Sabino Dick M.D.   On: 07/14/2013 07:38   Medications: I have reviewed the patient's current medications. Scheduled Meds: . aspirin EC  81 mg Oral Daily  . Difluprednate  1 drop Both Eyes BID  . dorzolamide  1 drop Both Eyes TID  . heparin  5,000 Units Subcutaneous 3 times per day  . indomethacin  50 mg Oral TID WC  . latanoprost  1 drop Right Eye QHS  . metoprolol tartrate  25 mg Oral BID  . vancomycin  1,250 mg Intravenous Once   And  . vancomycin  1,000 mg Intravenous Q12H   Continuous Infusions:  PRN Meds:.HYDROcodone-acetaminophen  Assessment/Plan: #Swelling of Joint: Left wrist s/p dry tap in clinic on 4/8. Ortho consulted and felt that his pain was 2/2 arthritis flare as his wrist x-ray showed significant arthritis with possible overlying cellulitis. IV Clindamycin started on admission for the cellulitis; the pt reported subjective improvement to me and ortho. However, this morning, he was with worsening pain and swelling at the wrist and now at the left elbow. Pt febrile overnight to 101.9 but remains without leukocytosis. CRP 18 and ESR 105. Blood cultures were cancelled for some reason- will reorder. Left elbow x-ray which did show tissue swelling. Orthopedics was re-consulted due to concerns for worsening infection involving the joint vs possible gout flare.. - F/u with Ortho, appreciate recommendations - Changing abx to Vancomycin for a little bit broader coverage.  - Blood cultures. - Pain control with Norco Q4H PRN  - Blood cultures reordered - CBC in am  #HTN: Normotensive, continue home Metoprolol 63m daily, holding home Furosemide 411mdaily.   #DVT PPx: [place] Heparin.   Dispo: D/c pending medical improvement.    The patient does have a current PCP (ADominic PeaDO) and [facility].  The patient does not have transportation limitations that hinder transportation to clinic appointments.  .Services Needed at  time of discharge: Y = Yes, Blank = No PT:   OT:   RN:   Equipment:   Other:     LOS: 2 days   KaOtho BellowsMD 07/15/2013, 12:01 PM  SaLesly DukesMD

## 2013-07-16 MED ORDER — NAPROXEN 500 MG PO TBEC: 500.0000 mg | DELAYED_RELEASE_TABLET | Freq: Two times a day (BID) | ORAL | Status: DC

## 2013-07-16 NOTE — Progress Notes (Signed)
Subjective: Patient seen and examined at the bedside this morning. His pain is much improved on the indomethacin. He can move his elbow joint when he could not before. We discussed the pros and cons of starting prednisone as well, and he agrees given his improvement he would rather stay on indomethacin monotherapy, unless his pain worsens. We talked abotu foods to avoid in gout. He is amenable to discharge home today.  Objective: Vital signs in last 24 hours: Filed Vitals:   07/15/13 0603 07/15/13 1658 07/15/13 2124 07/16/13 0552  BP: 111/63 103/55 109/61 135/70  Pulse: 73 66 64 70  Temp: 98.4 F (36.9 C) 97.7 F (36.5 C) 97.7 F (36.5 C) 97.8 F (36.6 C)  TempSrc: Oral Oral Oral Oral  Resp: '16 16 16 16  ' Height:      Weight:      SpO2: 96% 100% 99% 100%   Weight change:   Intake/Output Summary (Last 24 hours) at 07/16/13 0937 Last data filed at 07/16/13 0554  Gross per 24 hour  Intake   1080 ml  Output      0 ml  Net   1080 ml    Physical Exam:  Vitals reviewed. General: Sitting on the side of the bed eating breakfast, NAD HEENT: PERRL, EOMI Cardiac: RRR, no rubs, murmurs or gallops Pulm: clear to auscultation bilaterally, no wheezes, rales, or rhonchi Abd: soft, nontender, nondistended Ext: Left upper extremity with reduced swelling, warmth, and erythema of left wrist, improved ROM and less pain with motion. Also reduced swelling at the left elbow with improved range of motion and much improved pain.  Radial pulse present.  RUE wnl. Neuro: Alert and oriented X3, cranial nerves II-XII grossly intact, no focal neurologic deficits   Lab Results: Basic Metabolic Panel:  Recent Labs Lab 07/13/13 1640 07/14/13 0633  NA 138 138  K 4.3 4.0  CL 101 102  CO2 21 19  GLUCOSE 119* 104*  BUN 13 13  CREATININE 1.02 0.97  CALCIUM 9.3 8.9   Liver Function Tests:  Recent Labs Lab 07/13/13 1640  AST 15  ALT 11  ALKPHOS 59  BILITOT 0.9  PROT 8.0  ALBUMIN 3.5    CBC:  Recent Labs Lab 07/13/13 1640 07/14/13 0633 07/15/13 0623  WBC 8.4 7.9 7.8  NEUTROABS 5.0  --   --   HGB 12.3* 11.3* 11.2*  HCT 36.4* 32.9* 33.3*  MCV 92.4 92.2 92.0  PLT 218 216 224    Micro Results: Recent Results (from the past 240 hour(s))  CULTURE, BLOOD (SINGLE)     Status: None   Collection Time    07/13/13  4:40 PM      Result Value Ref Range Status   Preliminary Report Blood Culture received; No Growth to date;   Preliminary   Preliminary Report Culture will be held for 5 days before issuing   Preliminary   Preliminary Report a Final Negative report.   Preliminary  CULTURE, BLOOD (SINGLE)     Status: None   Collection Time    07/13/13  4:48 PM      Result Value Ref Range Status   Preliminary Report Blood Culture received; No Growth to date;   Preliminary   Preliminary Report Culture will be held for 5 days before issuing   Preliminary   Preliminary Report a Final Negative report.   Preliminary  BODY FLUID CULTURE     Status: None   Collection Time    07/15/13 12:17 PM  Result Value Ref Range Status   Specimen Description FLUID SYNOVIAL LEFT ARM   Final   Special Requests ELBOW   Final   Gram Stain     Final   Value: MODERATE WBC PRESENT,BOTH PMN AND MONONUCLEAR     NO ORGANISMS SEEN     Performed at [facility] Gram Stain Report Called to,Read Back By and Verified With: Gram Stain Report Called to,Read Back By and Verified With: K.[facility] RN 8PM 07/15/13 GUSTK     Performed at [facility]     Final   Value: NO GROWTH 1 DAY     Performed at [facility]   Report Status PENDING   Incomplete  ANAEROBIC CULTURE     Status: None   Collection Time    07/15/13 12:17 PM      Result Value Ref Range Status   Specimen Description FLUID SYNOVIAL LEFT ARM   Final   Special Requests ELBOW   Final   Gram Stain     Final   Value: ABUNDANT WBC PRESENT, PREDOMINANTLY PMN     NO SQUAMOUS EPITHELIAL CELLS SEEN     NO  ORGANISMS SEEN     Performed at [facility]   Culture PENDING   Incomplete   Report Status PENDING   Incomplete  GRAM STAIN     Status: None   Collection Time    07/15/13 12:17 PM      Result Value Ref Range Status   Specimen Description FLUID SYNOVIAL LEFT ARM   Final   Special Requests ELBOW   Final   Gram Stain     Final   Value: MODERATE WBC PRESENT,BOTH PMN AND MONONUCLEAR     NO ORGANISMS SEEN   Report Status 07/15/2013 FINAL   Final   Studies/Results: Dg Elbow Complete Left  07/15/2013   CLINICAL DATA:  Awoke on Friday with lateral elbow pain and limited range of motion.  EXAM: LEFT ELBOW - COMPLETE 3+ VIEW  COMPARISON:  None.  FINDINGS: The lateral radiograph is degraded due to obliquity. No definite displaced fracture or elbow joint effusion. Mild to moderate degenerative change of the elbow with joint space loss, subchondral sclerosis and osteophytosis, most conspicuously involving the ulna. No definite erosions. There is minimal enthesopathic change of the triceps tendon insertion site. There is potential mild diffuse soft tissue swelling primarily involving the posterior aspect of the elbow without associated area of osteolysis or radiopaque foreign body.  IMPRESSION: 1. Mild diffuse soft tissue swelling about the elbow without associated fracture, joint effusion or radiopaque foreign body. 2. Mild to moderate degenerative change of the elbow.   Electronically Signed   By: Sandi Mariscal M.D.   On: 07/15/2013 09:35   Medications: I have reviewed the patient's current medications. Scheduled Meds: . aspirin EC  81 mg Oral Daily  . Difluprednate  1 drop Both Eyes BID  . dorzolamide  1 drop Both Eyes TID  . heparin  5,000 Units Subcutaneous 3 times per day  . indomethacin  50 mg Oral TID WC  . latanoprost  1 drop Right Eye QHS  . metoprolol tartrate  25 mg Oral BID   Continuous Infusions:  PRN Meds:.HYDROcodone-acetaminophen  Assessment/Plan: #Acute gout of left wrist  and elbow: ESR/CRP elevated. Left elbow was tapped by ortho on 4/12. Synovial fluid studies showed monosodium urate crystals. Gram stain with moderate WBC, no organisms. Aerobic and anaerobic cultures are NGTD. Blood cultures 4/10 are  NGTD. Repeat 4/12 are pending. AVSS this am, no fever or leukocytosis. - Appreciate ortho recommendations - Antibiotics have been discontinued - Continue indomethacin 19m TID until pain is tolerable then reduce dose; usual treatment <3 to 5 days - Initiate lifestyle measures after discharge (weight loss, diet changes) - May need urate lowering therapy, [facility] - Hold off on prednisone for now - Pain control with Norco Q4H PRN  - Medically stable for discharge home  #HTN: Normotensive, continue home Metoprolol 464mdaily, ASA 81 mg daily, holding home Furosemide 4051maily.   #DVT PPx: [place] Heparin.   Dispo: Anticipated discharge in 0-1 days.   The patient does have a current PCP (AlDominic PeaO) and [facility].  The patient does not have transportation limitations that hinder transportation to clinic appointments.  .Services Needed at time of discharge: Y = Yes, Blank = No PT:   OT:   RN:   Equipment:   Other:     LOS: 3 days   SarLesly DukesD 07/16/2013, 9:37 AM  SarLesly DukesD  [email or web address] Pager # [phone] hours and weekends # [phone] # [phone]

## 2013-07-16 NOTE — Progress Notes (Signed)
Internal Medicine Attending  Date: 07/16/2013  Patient name: Timothy Bradford Medical record number: [id number] Date of birth: 1935-09-18 Age: 78 y.o. Gender: male  I saw and evaluated the patient. I reviewed the resident's note by Dr. Lucila Maine and I agree with the resident's findings and plans as documented in her note.

## 2013-07-16 NOTE — Discharge Instructions (Signed)
It was a pleasure taking care of you. - Please follow up in the [facility] as scheduled above. - Your lab work showed that you have gout. I have attached information on this condition. - Please avoid alcohol and red meats. These foods have been showed to bring on gout. - Continue indomethacin for 5 more days. At your clinic appointment, they will reassess your wrist/elbow and potentially start you on a medicine called Allopurinal, which can prevent recurrent gout attacks. - If you develop worsening redness, swelling, pain, please call the clinic for an earlier appointment or return to the ED.

## 2013-07-16 NOTE — Discharge Summary (Signed)
Name: Timothy Bradford MRN: [id number] DOB: 1935-12-06 78 y.o. PCP: Dominic Pea, DO  Date of Admission: 07/13/2013  5:51 PM Date of Discharge: 07/16/2013 Attending Physician: Axel Filler, MD  Discharge Diagnosis: Principal Problem:   Swelling of joint, wrist, left 2/2 acute gout Active Problems:   HYPERTENSION   OSTEOARTHRITIS   Cellulitis   Cough  Discharge Medications:   Medication List         aspirin EC 81 MG tablet  Take 81 mg by mouth daily.     bimatoprost 0.01 % Soln  Commonly known as:  LUMIGAN  Place 1 drop into the right eye at bedtime.     dorzolamide 2 % ophthalmic solution  Commonly known as:  TRUSOPT  Place 1 drop into both eyes 3 (three) times daily.     DUREZOL 0.05 % Emul  Generic drug:  Difluprednate  Place 1 drop into both eyes 2 (two) times daily.     furosemide 40 MG tablet  Commonly known as:  LASIX  Take 40 mg by mouth daily.     metoprolol tartrate 25 MG tablet  Commonly known as:  LOPRESSOR  Take 25 mg by mouth 2 (two) times daily.     naproxen 500 MG EC tablet  Commonly known as:  NAPROXEN DR  Take 1 tablet (500 mg total) by mouth 2 (two) times daily with a meal.     potassium chloride SA 20 MEQ tablet  Commonly known as:  K-DUR,KLOR-CON  Take 20 mEq by mouth 2 (two) times daily.     simvastatin 40 MG tablet  Commonly known as:  ZOCOR  Take 40 mg by mouth at bedtime.        Disposition and follow-up:   Timothy Bradford was discharged from [facility] in Stable condition.  At the hospital follow up visit please address:  1.  Improvement in pain/swelling in left wrist and elbow. Please consider evaluating for starting urate lowering therapy.  2.  Labs / imaging needed at time of follow-up: Uric acid  3.  Pending labs/ test needing follow-up: Blood and synovial fluid cultures (NGTD)  Follow-up Appointments: Follow-up Information   Follow up with Blain Pais, MD On 07/23/2013. (_0 :45am. This is  your PCP.)    Specialty:  Internal Medicine   Contact information:   [place] [address] [phone]       Discharge Instructions: Discharge Orders   Future Appointments Provider Department Dept Phone   07/23/2013 8:45 AM Blain Pais, MD [place] [phone]   Future Orders Complete By Expires   Call MD for:  severe uncontrolled pain  As directed    Call MD for:  temperature >100.4  As directed    Diet - low sodium heart healthy  As directed    Discharge instructions  As directed    Increase activity slowly  As directed       Consultations: Treatment Team:  Renette Butters, MD  Procedures Performed:  Dg Elbow Complete Left  07/15/2013   CLINICAL DATA:  Awoke on Friday with lateral elbow pain and limited range of motion.  EXAM: LEFT ELBOW - COMPLETE 3+ VIEW  COMPARISON:  None.  FINDINGS: The lateral radiograph is degraded due to obliquity. No definite displaced fracture or elbow joint effusion. Mild to moderate degenerative change of the elbow with joint space loss, subchondral sclerosis and osteophytosis, most conspicuously involving the ulna. No definite erosions. There is  minimal enthesopathic change of the triceps tendon insertion site. There is potential mild diffuse soft tissue swelling primarily involving the posterior aspect of the elbow without associated area of osteolysis or radiopaque foreign body.  IMPRESSION: 1. Mild diffuse soft tissue swelling about the elbow without associated fracture, joint effusion or radiopaque foreign body. 2. Mild to moderate degenerative change of the elbow.   Electronically Signed   By: Sandi Mariscal M.D.   On: 07/15/2013 09:35   Dg Wrist 2 Views Left  07/14/2013   CLINICAL DATA:  Left wrist pain and swelling without injury.  EXAM: LEFT WRIST - 2 VIEW  COMPARISON:  July 12, 2013.  FINDINGS: No fracture or dislocation is noted. Narrowing and sclerosis of the first carpometacarpal joint is  noted as well as the radial carpal joint consistent with degenerative joint disease. Vascular calcifications are noted.  IMPRESSION: Degenerative changes as described above. No acute abnormality seen in the left wrist.   Electronically Signed   By: Sabino Dick M.D.   On: 07/14/2013 07:38   Dg Wrist Complete Left  07/12/2013   CLINICAL DATA:  Left wrist swelling  EXAM: LEFT WRIST - COMPLETE 3+ VIEW  COMPARISON:  None.  FINDINGS: Moderate degenerative change at the base of the thumb with joint space narrowing and spurring. Moderate degenerative change in the radiocarpal joint with joint space narrowing and spurring. Degenerative change in the distal radioulnar joint.  Negative for fracture or erosion.  Arterial calcification noted.  IMPRESSION: Moderate degenerative change as above.  Negative for fracture.   Electronically Signed   By: Franchot Gallo M.D.   On: 07/12/2013 12:07    Admission HPI:  78 y.o. gentleman with PMH significant for HTN, HLD, Systolic Heart failure, Osteoarthritis, seborrheic keratosis, presents with c/o left wrist pain, that started 3 days ago, with any some minimal swelling, which reduced range of movement, an aggravation of pain with movement. Associated chills with measured temperature of 99.7. No prior episodes of joint swelling, but endorses having chronic knee pains- worse with activity. Patient does have a dry cough, with symptoms of a cold, her temperature then high of 100.1, is resolved spontaneously.   No known history of trauma, no falls, no headaches no dizziness, no shortness of breath, no diarrhea no constipation, no dysuria, denies alcohol intake, and home meds include frusemide.  Presented to [facility] yesterday , X-rays of the left wrist were obtained that revealed Moderate degenerative changes at the base of the thumb and radiocarpal joint, with joint space narrowing and spurring and was treated conservatively, to return if symptoms got worse.   Presented today to [facility]  clinic with increasing pain, and swelling and chills, joint aspiration was attempted- But no fluid was aspirated.  Physical Exam:  Blood pressure 143/79, pulse 87, temperature 99.1 F (37.3 C), temperature source Oral, resp. rate 16, height $RemoveBe'6\' 1"'KbvhvRiaU$  (1.854 m), weight 189 lb (85.73 kg), SpO2 99.00%.  GENERAL- alert, co-operative, appears as stated age, not in any distress.  HEENT- Atraumatic, normocephalic, PERRL, EOMI, oral mucosa appears moist, overall poor dentition.  CARDIAC- RRR, no murmurs, rubs or gallops.  RESP- Moving equal volumes of air, and clear to auscultation bilaterally.  ABDOMEN- Soft,non tender, no palpable masses or organomegaly, bowel sounds present.  BACK- Normal curvature of the spine, No tenderness along the vertebrae, no CVA tenderness.  NEURO- OTPP, No obvious Cr N abnormality,  EXTREMITIES- Left Upper extremity- swelling of left wrist, appears abit tense and shiny,swelling extending from distal part of  the hand- Dorsal surface to proximal third of wrist. Restricted range of motion- wrist flexion and flexion of fingers, due to pain and swelling, Skin cool to touch and radial pulse present. Lower extremities- Chronic swelling and dry skin bilat, swelling extending from the feet to upper legs.   SKIN- Warm, dry.  PSYCH- Normal mood and affect, appropriate thought content and speech.   Hospital Course by problem list:  1. Acute gout of left wrist and elbow: Initial tap of left wrist in the [facility] was dry, so patient was started on antibiotics empirically for cellulitis with IV Clindamycin. Ortho was consulted who did not feel this was a septic joint, perhaps cellulitis vs. Arthritis flare, recommend continue antibiotics. However, there was no improvement on this regimen and in fact his left elbow began to swell with pain and decreased ROM on HOD #2. Antibiotics were broadened to IV vancomycin. ESR/CRP checked and elevated. Ortho was re-consulted and they tapped the left elbow on  4/12. Synovial fluid studies showed monosodium urate crystals. Gram stain with moderate WBC, no organisms. Aerobic and anaerobic cultures were NGTD. Blood cultures 4/10 are NGTD. Repeat blood cultures 4/12 are pending. AVSS during his admission, no fever or leukocytosis. We stopped antibiotics and started the patient on indomethacin 37m TID with drastic improvement in pain and ROM overnight. He will continue NSAIDS (Naproxen 5045mBID, as this is on his insurance formulary) for a limited course of 7 days. We discuseed lifestyle measures to prevent gout after discharge (weight loss, diet changes, reduce alcohol and red meat intrake). He may need urate lowering therapy, [facility]. We did not start prednisone given drastic improvement on NSAIDs and history of CHF.  2. HTN: Normotensive, we continued his home Metoprolol 4042maily, ASA 81 mg daily, and held his home Furosemide 60m71mily during acute flare. This was resumed on discharge.   Discharge Vitals:   BP 135/70  Pulse 70  Temp(Src) 97.8 F (36.6 C) (Oral)  Resp 16  Ht _0  (1.854 m)  Wt 189 lb (85.73 kg)  BMI 24.94 kg/m2  SpO2 100%  Discharge Labs:  Results for orders placed during the hospital encounter of 07/13/13 (from the past 24 hour(s))  SYNOVIAL CELL COUNT + DIFF, W/ CRYSTALS     Status: Abnormal   Collection Time    07/15/13 12:17 PM      Result Value Ref Range   Color, Synovial YELLOW  YELLOW   Appearance-Synovial CLOUDY (*) CLEAR   Crystals, Fluid EXTRACELLULAR MONOSODIUM URATE CRYSTALS     WBC, Synovial 38856 (*) 0 - 200 /cu mm   Neutrophil, Synovial 89 (*) 0 - 25 %   Lymphocytes-Synovial Fld 3  0 - 20 %   Monocyte-Macrophage-Synovial Fluid 8 (*) 50 - 90 %   Eosinophils-Synovial 0  0 - 1 %  BODY FLUID CULTURE     Status: None   Collection Time    07/15/13 12:17 PM      Result Value Ref Range   Specimen Description FLUID SYNOVIAL LEFT ARM      Special Requests ELBOW     Gram Stain       Value: MODERATE WBC PRESENT,BOTH PMN AND MONONUCLEAR     PREVOIUSLY REPORTED AS NO WBC SEEN CORRECTED REPORT CALLED TO KIMBRELL RN 9PM 07/15/13 GUSTK NO ORGANISMS SEEN     [facility] St[facility] Report Called to,Read Back  By and Verified With: Gram Stain Report Called to,Read Back By and Verified With: [facility] RN 8PM 07/15/13 GUSTK     Performed at [facility]   Culture       Value: NO GROWTH 1 DAY     Performed at [facility]   Report Status PENDING    ANAEROBIC CULTURE     Status: None   Collection Time    07/15/13 12:17 PM      Result Value Ref Range   Specimen Description FLUID SYNOVIAL LEFT ARM     Special Requests ELBOW     Gram Stain       Value: ABUNDANT WBC PRESENT, PREDOMINANTLY PMN     NO SQUAMOUS EPITHELIAL CELLS SEEN     NO ORGANISMS SEEN     Performed at [facility]   Culture PENDING     Report Status PENDING    GLUCOSE, SYNOVIAL FLUID     Status: None   Collection Time    07/15/13 12:17 PM      Result Value Ref Range   Glucose, Synovial Fluid 64    GRAM STAIN     Status: None   Collection Time    07/15/13 12:17 PM      Result Value Ref Range   Specimen Description FLUID SYNOVIAL LEFT ARM     Special Requests ELBOW     Gram Stain       Value: MODERATE WBC PRESENT,BOTH PMN AND MONONUCLEAR     NO ORGANISMS SEEN   Report Status 07/15/2013 FINAL      Signed: Lesly Dukes, MD 07/16/2013, 11:04 AM   Time Spent on Discharge: 35 minutes Services Ordered on Discharge: None Equipment Ordered on Discharge: None

## 2013-07-17 NOTE — Progress Notes (Signed)
Case discussed with Dr. Boggala at time of visit.  We reviewed the resident's history and exam and pertinent patient test results.  I agree with the assessment, diagnosis, and plan of care documented in the resident's note. 

## 2013-07-18 LAB — BODY FLUID CULTURE: CULTURE: NO GROWTH

## 2013-07-19 LAB — CULTURE, BLOOD (SINGLE): Organism ID, Bacteria: NO GROWTH

## 2013-07-20 LAB — ANAEROBIC CULTURE

## 2013-07-20 LAB — CULTURE, BLOOD (SINGLE): ORGANISM ID, BACTERIA: NO GROWTH

## 2013-07-22 LAB — CULTURE, BLOOD (ROUTINE X 2)
Culture: NO GROWTH
Culture: NO GROWTH

## 2013-07-23 ENCOUNTER — Ambulatory Visit (HOSPITAL_COMMUNITY)
Admission: RE | Admit: 2013-07-23 | Discharge: 2013-07-23 | Disposition: A | Discharge status: Home / Self Care | Payer: Medicare Other | Source: Ambulatory Visit | Attending: Internal Medicine | Admitting: Internal Medicine

## 2013-07-23 ENCOUNTER — Ambulatory Visit (INDEPENDENT_AMBULATORY_CARE_PROVIDER_SITE_OTHER): Payer: Medicare Other | Admitting: Internal Medicine

## 2013-07-23 ENCOUNTER — Encounter: Payer: Self-pay | Admitting: Internal Medicine

## 2013-07-23 VITALS — BP 136/79 | HR 78 | Temp 97.5°F | Wt 186.8 lb

## 2013-07-23 DIAGNOSIS — J449 Chronic obstructive pulmonary disease, unspecified: Secondary | ICD-10-CM | POA: Insufficient documentation

## 2013-07-23 DIAGNOSIS — D649 Anemia, unspecified: Secondary | ICD-10-CM

## 2013-07-23 DIAGNOSIS — M109 Gout, unspecified: Secondary | ICD-10-CM

## 2013-07-23 DIAGNOSIS — R05 Cough: Secondary | ICD-10-CM

## 2013-07-23 DIAGNOSIS — J4489 Other specified chronic obstructive pulmonary disease: Secondary | ICD-10-CM | POA: Insufficient documentation

## 2013-07-23 DIAGNOSIS — R058 Other specified cough: Secondary | ICD-10-CM

## 2013-07-23 DIAGNOSIS — R059 Cough, unspecified: Secondary | ICD-10-CM

## 2013-07-23 LAB — URIC ACID: URIC ACID, SERUM: 9.2 mg/dL — AB (ref 4.0–7.8)

## 2013-07-23 LAB — ANEMIA PANEL
%SAT: 21 % (ref 20–55)
ABS Retic: 109.4 10*3/uL (ref 19.0–186.0)
FOLATE: 7.4 ng/mL
Ferritin: 293 ng/mL (ref 22–322)
IRON: 78 ug/dL (ref 42–165)
RBC.: 4.05 MIL/uL — ABNORMAL LOW (ref 4.22–5.81)
RETIC CT PCT: 2.7 % — AB (ref 0.4–2.3)
TIBC: 365 ug/dL (ref 215–435)
UIBC: 287 ug/dL (ref 125–400)
VITAMIN B 12: 336 pg/mL (ref 211–911)

## 2013-07-23 LAB — CBC
HCT: 36.5 % — ABNORMAL LOW (ref 39.0–52.0)
Hemoglobin: 12.1 g/dL — ABNORMAL LOW (ref 13.0–17.0)
MCH: 29.9 pg (ref 26.0–34.0)
MCHC: 33.2 g/dL (ref 30.0–36.0)
MCV: 90.1 fL (ref 78.0–100.0)
Platelets: 513 10*3/uL — ABNORMAL HIGH (ref 150–400)
RBC: 4.05 MIL/uL — AB (ref 4.22–5.81)
RDW: 13.7 % (ref 11.5–15.5)
WBC: 5.7 10*3/uL (ref 4.0–10.5)

## 2013-07-23 NOTE — Patient Instructions (Addendum)
-  Continue following a healthy diet and avoiding foods that may trigger gout.  -You need a chest Xray, this can be done at your earliest convenience.  -Follow up with [place] in 1 month for reevaluation of your symptoms. Please call us and let us know if you have pain.   Thank you for bringing your medicines today. This helps [place] keep you safe from mistakes.

## 2013-07-25 DIAGNOSIS — M109 Gout, unspecified: Secondary | ICD-10-CM | POA: Insufficient documentation

## 2013-07-25 NOTE — Assessment & Plan Note (Signed)
Ordered anemia panel which was essentially nl except for increased retic count which could be 2/2 to acute phase reactant. Ferritin, iron, B12, folate all normal. Hg stable at 12.1

## 2013-07-25 NOTE — Assessment & Plan Note (Addendum)
Pt has cough that could be secondary to recent viral URI. In reviewing his chart, however, pt had CXR from 2010 with finding suspicious for a nodule with recommended f/u in 2-3 weeks that was never done. Of note, pt quit smoking more than 40 yrs ago, has a 20pack/yr smoking hx.  Ordered CXR which was negative for nodules or acute changes.

## 2013-07-25 NOTE — Progress Notes (Signed)
   Subjective:    Patient ID: Timothy Bradford, male    DOB: 28-Apr-1935, 78 y.o.   MRN: [id number]  HPI Timothy Bradford is a pleasant 78 yr old man with PMH of sCHF, HTN, hyperlipidemia, OA, who presents, accompanied by his wife, for HFU. He was hospitalized earlier this month with discharge on 4/13 for evaluation and treatment of left wrist swelling which initially was thought to be septic arthritis. During his hospitalization he also developed left elbow swelling which was tapped with synovial studies positive for monosodium urate crystals. He responded to prompt treatment of this gout attack and continues to do well with complete resolution of the pain in his left wrist and left elbow. He still has mild swelling of these joints but has full ROM of these joints. This was his first gout attack. He notes that he had consumed a large amount of fried fish the day prior to this gout attack. He does not drink alcohol and does not eat red meat often. He has been on Lasix for years with no recent dose change.  He also complains of a dry cough that has been present for about a month. His wife notes that he had a viral URI last month that had resolved just prior to his hospitalization and now he has a "lingering cough".    Review of Systems  Constitutional: Negative for fever, chills, diaphoresis, activity change, appetite change, fatigue and unexpected weight change.  Respiratory: Negative for shortness of breath.   Cardiovascular: Positive for leg swelling.       He has chronic bilateral LE edema  Genitourinary: Negative for dysuria.  Musculoskeletal: Negative for arthralgias.       Mild swelling of his left wrist and left elbow  Skin: Negative for color change.  Neurological: Negative for dizziness and headaches.  Hematological: Negative for adenopathy.  Psychiatric/Behavioral: Negative for agitation.       Objective:   Physical Exam  Nursing note and vitals reviewed. Constitutional: He is oriented to  person, place, and time. He appears well-developed and well-nourished. No distress.  Cardiovascular: Normal rate and regular rhythm.   Pulmonary/Chest: Effort normal and breath sounds normal. No respiratory distress. He has no wheezes. He has no rales.  Musculoskeletal: He exhibits edema. He exhibits no tenderness.  Bilateral 1+ pitting edema of LE from his ankles up to his mid shins which is reported as his basline.  Left wrist and left elbow with mild edema with no effusion, no erythema, no increased warmth, not TTP, and with full ROM    Neurological: He is alert and oriented to person, place, and time.  Skin: Skin is warm and dry. He is not diaphoretic.  Psychiatric: He has a normal mood and affect. His behavior is normal.          Assessment & Plan:

## 2013-07-25 NOTE — Assessment & Plan Note (Signed)
He had his first gout attack that responded to Naproxen which he is no longer taking as his pain is completely resolved. Given that this was his first gout attack, will not start allopurinol at this time but if this reoccurs, he may benefit from allopurinol tx.  Of note, his uric acid level is elevated at 9.2.  Pt and his wife were given a list of dietary changes to prevent gout flare up.

## 2013-07-26 NOTE — Progress Notes (Signed)
Case discussed with Dr. Hayes Ludwig at the time of the visit.  We reviewed the resident's history and exam and pertinent patient test results.  I agree with the assessment, diagnosis and plan of care documented in the resident's note.

## 2013-08-03 ENCOUNTER — Encounter: Payer: Self-pay | Admitting: Internal Medicine

## 2013-08-03 ENCOUNTER — Ambulatory Visit (INDEPENDENT_AMBULATORY_CARE_PROVIDER_SITE_OTHER): Payer: Medicare Other | Admitting: Internal Medicine

## 2013-08-03 VITALS — BP 118/76 | HR 82 | Temp 97.0°F | Ht 73.0 in | Wt 184.2 lb

## 2013-08-03 DIAGNOSIS — M109 Gout, unspecified: Secondary | ICD-10-CM

## 2013-08-03 DIAGNOSIS — I509 Heart failure, unspecified: Secondary | ICD-10-CM

## 2013-08-03 MED ORDER — NAPROXEN 500 MG PO TBEC: 500.0000 mg | DELAYED_RELEASE_TABLET | Freq: Two times a day (BID) | ORAL | Status: DC

## 2013-08-03 NOTE — Progress Notes (Signed)
Subjective:   Patient ID: Timothy Bradford male   DOB: 24-Oct-1935 78 y.o.   MRN: [id number]  HPI: [name] is a 78 y.o. gentleman with PMH significant for HTN, CHF, recently diagnosed gout (April 2015) comes to the office with CC of pain and swelling of his left wrist and elbow and for a refill of his naproxen.  Patient was hospitalized from 07/13/13 to 07/16/13 for pain and swelling of left wirst and elbow and his synovial fluid analysis revealed monosodium urate crystals in the left elbow. Patient was symptomatically improved after treatment with Naproxen and was discharged on 07/16/13 with HFU on 07/23/13. During the [place] visit on 07/23/13, left elbow and wrist were slightly swollen but he was not complaining of any pain. His uric acid level found to be elevated at 9.2 (normal range 4.0 - 7.8). Given this was the first gout attack, he was not started on allopurinol.  Patient reports that his left wrist and elbow swelling have not gotten better as he still has pain and swelling. He reports difficulty using his left wrist/elbow during his daily activities and is requesting for a refill of naproxen. He denies any worsening of swelling of the elbow or wrist but states that the swelling didn't resolve completely ever since discharge from hospital 07/16/13.  Patient denies any fever, chills, body pains. Patient denies any swelling or pain of other joints in the body. Patients wife states that he drink a lot of soda and decaffeinated coffee. He denies drinking any alcohol or red meat.   He denies any other complaints during this visit.    Past Medical History  Diagnosis Date  . Hypertension   . Hyperlipidemia   . Osteoarthritis   . Erectile dysfunction   . Chronic venous insufficiency   . CHF 9509    Systolic failure. Cardiac cath in 2004: The LV showed mild global hypokinesia. EF of 45-50%. LVEDP was 12 mmHg. Left main was long which was patent. LAD has 10% distal stenosis. The vessel proximally  was very tortuous. Diagonal one was small which was patent. Left circumflex was patent proximally and in mid portion and then tapers down in AV groove after giving off OM-2. OM-1 was large which was p  . Seborrheic keratoses     Multiple. Sees Lyndle Herrlich   Current Outpatient Prescriptions  Medication Sig Dispense Refill  . aspirin EC 81 MG tablet Take 81 mg by mouth daily.      . bimatoprost (LUMIGAN) 0.01 % SOLN Place 1 drop into the right eye at bedtime.      . Difluprednate (DUREZOL) 0.05 % EMUL Place 1 drop into both eyes 2 (two) times daily.       . dorzolamide (TRUSOPT) 2 % ophthalmic solution Place 1 drop into both eyes 3 (three) times daily.      . furosemide (LASIX) 40 MG tablet Take 40 mg by mouth daily.      . metoprolol tartrate (LOPRESSOR) 25 MG tablet Take 25 mg by mouth 2 (two) times daily.      . naproxen (NAPROXEN DR) 500 MG EC tablet Take 1 tablet (500 mg total) by mouth 2 (two) times daily with a meal.  28 tablet  0  . potassium chloride SA (K-DUR,KLOR-CON) 20 MEQ tablet Take 20 mEq by mouth 2 (two) times daily.      . simvastatin (ZOCOR) 40 MG tablet Take 40 mg by mouth at bedtime.      . [DISCONTINUED] potassium chloride (KLOR-CON)  20 MEQ packet Take 1 tablet by mouth twice a day.  90 tablet  3   No current facility-administered medications for this visit.   No family history on file. History   Social History  . Marital Status: Married    Spouse Name: N/A    Number of Children: N/A  . Years of Education: N/A   Social History Main Topics  . Smoking status: Former Smoker    Quit date: 03/16/1988  . Smokeless tobacco: None  . Alcohol Use: No     Comment: Quit 1999, h/o DT's and szs. No ETOH since 1999  . Drug Use: No  . Sexual Activity: None   Other Topics Concern  . None   Social History Narrative  . None   Review of Systems: Pertinent items are noted in HPI. Objective:  Physical Exam: Filed Vitals:   08/03/13 1025  BP: 118/76  Pulse: 82  Temp:  97 F (36.1 C)  TempSrc: Oral  Height: 6\' 1"  (1.854 m)  Weight: 184 lb 3.2 oz (83.553 kg)  SpO2: 99%   Constitutional: Vital signs reviewed.   Patient is a well-developed and well-nourished and is in no acute distress and cooperative with exam. Alert and oriented x3.  Head: Normocephalic and atraumatic Cardiovascular: RRR, S1, S2 normal. Pulmonary/Chest: normal respiratory effort, CTAB. Musculoskeletal: Visible swelling of left wrist and elbow noted. There is slight warmth ness noted over the dorsal aspect of the left wrist and left elbow feels normal temperature. Slight tenderness to deep palpation over the left wrist and elbow. ROM of the left elbow and wrist are slightly reduced. Neurological: A&O x3. Non-focal exam. Skin: Warm, dry and intact. No rash, cyanosis, or clubbing.  Psychiatric: Normal mood and affect.   Assessment & Plan:

## 2013-08-03 NOTE — Progress Notes (Signed)
Case discussed with Dr. Boggala at the time of the visit.  We reviewed the resident's history and exam and pertinent patient test results.  I agree with the assessment, diagnosis, and plan of care documented in the resident's note. 

## 2013-08-03 NOTE — Patient Instructions (Addendum)
Take Naproxen as recommended. Stop taking the Naproxen after your pain/ swelling improves. Read the printed material that I gave to you and avoid the foods such as red meat, lamb, pork and organ meat, seafood (especially shellfish like shrimp and lobster) and sardines and limit the beverages such as Beer, grain liquors and wine, Sweet fruit juices, Soft drinks. If your symptoms persist or worsen, please call us or seek medical help.

## 2013-08-03 NOTE — Assessment & Plan Note (Signed)
Diagnosed during the hospitalization (07/13/13 to 07/16/13) for left wrist and elbow pain/swelling. Left elbow aspirated and synovial fluid analysis revealed MSU crystals. Responded well to Naproxen therapy. Uric level high on 07/23/13 9.2 (4.0 to 7.8 normal range.) Persistent swelling and pain despite naproxen therapy for a week after discharge. X ray of left wrist show moderate degenerative changes. Suspect worsening of OA contributing to his persistent symptoms. Discussed at length about dietary changes, possible treatment option with Allopurinol if 2 or more attacks / year. In his current medication list beta blockers, ASA, Lasix increase the serum uric acid levels and risk of acute gout.   Discussed with the attending Dr. Ellwood Dense regarding further management.  Plans: Refill Naproxen 500 mg BID x 2 weeks. Provided paper hand out about the food items that should be avoided or reduced. Patient and his wife understand that this is a symptomatic treatment and this treatment cannot prevent another gout attack. Continue Beta blockers, Lasix, ASA. The benefits of continuing these medications would outweigh the risks of discontinuing them. Follow up as needed if symptoms persist or worsen.

## 2013-08-21 ENCOUNTER — Other Ambulatory Visit: Payer: Self-pay | Admitting: Internal Medicine

## 2013-08-22 ENCOUNTER — Ambulatory Visit: Payer: Medicare Other | Admitting: Internal Medicine

## 2013-09-10 NOTE — Telephone Encounter (Signed)
Message was completed.

## 2013-10-28 ENCOUNTER — Other Ambulatory Visit: Payer: Self-pay | Admitting: Internal Medicine

## 2013-11-15 ENCOUNTER — Other Ambulatory Visit: Payer: Self-pay | Admitting: Internal Medicine

## 2013-12-12 ENCOUNTER — Ambulatory Visit (INDEPENDENT_AMBULATORY_CARE_PROVIDER_SITE_OTHER): Payer: Medicare Other | Admitting: Internal Medicine

## 2013-12-12 ENCOUNTER — Encounter: Payer: Self-pay | Admitting: Internal Medicine

## 2013-12-12 VITALS — BP 163/79 | HR 81 | Temp 97.7°F | Ht 74.0 in | Wt 187.3 lb

## 2013-12-12 DIAGNOSIS — M25473 Effusion, unspecified ankle: Secondary | ICD-10-CM

## 2013-12-12 DIAGNOSIS — R040 Epistaxis: Secondary | ICD-10-CM

## 2013-12-12 DIAGNOSIS — M25476 Effusion, unspecified foot: Secondary | ICD-10-CM

## 2013-12-12 DIAGNOSIS — I10 Essential (primary) hypertension: Secondary | ICD-10-CM

## 2013-12-12 MED ORDER — FUROSEMIDE 40 MG PO TABS: 20.0000 mg | ORAL_TABLET | Freq: Every day | ORAL | Status: DC

## 2013-12-12 NOTE — Patient Instructions (Signed)
-  Start taking 1 and a half tablet of Lasix (furosemide) every day for 5 days. Continue taking the whole tablet in the morning and 1/2 the tablet in the afternoon at [facility].  -Elevate your legs as much as possible.  -Start using compression stocking or wrap the legs with ACE band.  -Follow up with me on Monday, September 14th.   -It was great seeing you today!  Thank you for bringing your medicines today. This helps [place] keep you safe from mistakes.

## 2013-12-13 NOTE — Assessment & Plan Note (Signed)
BP Readings from Last 3 Encounters:  12/12/13 163/79  08/03/13 118/76  07/23/13 136/79    Lab Results  Component Value Date   NA 138 07/14/2013   K 4.0 07/14/2013   CREATININE 0.97 07/14/2013    Assessment: Blood pressure control:  Elevated Progress toward BP goal:   Not at goal Comments: He is on Lasix 40mg  daily, lopressor 25mg  daily, but has increased edema in LE.   Plan: Medications:  continue current medications and add Lasix 20mg  daily x 5days Educational resources provided: brochure;handout;video Self management tools provided:   Other plans: Follow up early next week.

## 2013-12-13 NOTE — Progress Notes (Signed)
   Subjective:    Patient ID: Timothy Bradford, male    DOB: 10-Jul-1935, 78 y.o.   MRN: [id number]  HPI Mr. Melucci is a pleasant 78 year old man with PMH of systolic HF with EF of 26-83%, gout, and HTN who presents for evaluation of bilateral ankle edema that has been increasing for the past 2 weeks. He reports no increased salt consumption and states that he is compliant with his Lasix 40mg  daily. He does not weight himself everyday. He walks everyday but also spends hours sitting down with both his feet down.  He also reports that he had a headache this past Saturday that resolved on its own. He had sneezing on Sunday that precipitated a nosebleed with small amount of blood, that promptly stopped with pressure.  He reports having good appetite with no urinary retention symptoms.    Review of Systems  Constitutional: Negative for fever, chills, diaphoresis, activity change, appetite change, fatigue and unexpected weight change.  Respiratory: Negative for cough, shortness of breath and wheezing.   Cardiovascular: Positive for leg swelling. Negative for chest pain and palpitations.  Gastrointestinal: Negative for abdominal pain.  Genitourinary: Negative for dysuria and difficulty urinating.  Skin: Negative for rash.  Neurological: Negative for dizziness, weakness and light-headedness.  Psychiatric/Behavioral: Negative for agitation.       Objective:   Physical Exam  Nursing note and vitals reviewed. Constitutional: He is oriented to person, place, and time. He appears well-developed and well-nourished. No distress.  HENT:  Small nasal polyp in left naris with no bleeding stigmata.   Cardiovascular: Normal rate and regular rhythm.   Pulmonary/Chest: Effort normal. No respiratory distress. He has no wheezes. He has no rales.  Abdominal: Soft. He exhibits no distension. There is no tenderness.  Musculoskeletal: He exhibits edema. He exhibits no tenderness.  He has 1+ pitting edema bilaterally up  to his knees with no skin changes, no TTP.  DP intact bilaterally, no ulcers/bruises/or skin tears noted  Neurological: He is alert and oriented to person, place, and time.  Skin: Skin is warm and dry. No rash noted. He is not diaphoretic.  Psychiatric: He has a normal mood and affect.          Assessment & Plan:

## 2013-12-13 NOTE — Assessment & Plan Note (Signed)
Likely due to worsening CHF perhaps due to noncompliance wit low sodium diet.  Continue home lasix 40mg  daily, add Lasix 20mg  daily x 5days Compression stocking Pt encouraged to elevate legs as much as possible -Pt agrees to follow up early next week for reassessment.

## 2013-12-13 NOTE — Assessment & Plan Note (Signed)
With small amount of blood, precipitated by sneezing. He has a small polyp in left naris with no bleeding stigmata.  Continue monitoring.

## 2013-12-17 ENCOUNTER — Ambulatory Visit (INDEPENDENT_AMBULATORY_CARE_PROVIDER_SITE_OTHER): Payer: Medicare Other | Admitting: Internal Medicine

## 2013-12-17 ENCOUNTER — Encounter: Payer: Self-pay | Admitting: Internal Medicine

## 2013-12-17 VITALS — BP 150/88 | HR 67 | Temp 97.2°F | Wt 184.5 lb

## 2013-12-17 DIAGNOSIS — Z0000 Encounter for general adult medical examination without abnormal findings: Secondary | ICD-10-CM

## 2013-12-17 DIAGNOSIS — M25473 Effusion, unspecified ankle: Secondary | ICD-10-CM

## 2013-12-17 DIAGNOSIS — M25476 Effusion, unspecified foot: Secondary | ICD-10-CM

## 2013-12-17 DIAGNOSIS — Z23 Encounter for immunization: Secondary | ICD-10-CM

## 2013-12-17 DIAGNOSIS — I10 Essential (primary) hypertension: Secondary | ICD-10-CM

## 2013-12-17 NOTE — Assessment & Plan Note (Signed)
BP Readings from Last 3 Encounters:  12/17/13 150/88  12/12/13 163/79  08/03/13 118/76    Lab Results  Component Value Date   NA 138 07/14/2013   K 4.0 07/14/2013   CREATININE 0.97 07/14/2013    Assessment: Blood pressure control:  Not controlled Progress toward BP goal:   Not at goal Comments: He is on metoprolol 25 mg BID and Lasix 60mg  daily (from his usual Lasix 40mg  daily).   Plan: Medications:  continue current medications Educational resources provided:   Self management tools provided:   Other plans: Reassess in 1-2 months.

## 2013-12-17 NOTE — Patient Instructions (Addendum)
General Instructions: -Take Lasix 60mg , 1 tablet plus 1/2 tablet for 5 days then go back to taking 1 tablet per day.  -Follow up with [place] in 1 month for recheck of your blood pressure.   Thank you for bringing your medicines today. This helps [place] keep you safe from mistakes.   Treatment Goals:  Goals (1 Years of Data) as of 12/17/13         As of Today As of Today 12/12/13 08/03/13 07/23/13     Blood Pressure    . Blood Pressure < 140/90  150/88 157/76 163/79 118/76 136/79      Progress Toward Treatment Goals:  Treatment Goal 12/17/2013  Blood pressure unchanged    Self Care Goals & Plans:  Self Care Goal 12/17/2013  Manage my medications refill my medications on time  Eat healthy foods -  Be physically active -  Meeting treatment goals maintain the current self-care plan    No flowsheet data found.   Care Management & Community Referrals:  Referral 12/17/2013  Referrals made for care management support none needed

## 2013-12-17 NOTE — Assessment & Plan Note (Addendum)
This has improved with extra 20mg  daily of Lasix (on 60mg  daily from 40mg  daily), and leg elevated. However, he still has edema on exam. His weight is down by 3lbs since his last visit.  -Pt encouraged to weight him after 5 more days of extra Lasix 20mg  daily -Pt educated on low sodium diet and to take extra 1/2 tablet (20mg  of Lasix) for 3 days if he has increased ankle swelling.  -Continue Lasix 60mg  for 5 more days then go back to Lasix 40mg  daily.  -Continue Kdur 57mEq daily -Follow in 1-2 months for evaluation of BP and swelling, may need BMET rechecked at that time

## 2013-12-17 NOTE — Progress Notes (Signed)
Patient ID: Timothy Bradford, male   DOB: 11-18-1935, 78 y.o.   MRN: [id number]   Case discussed with Dr. Hayes Ludwig soon after the resident saw the patient.  We reviewed the resident's history and exam and pertinent patient test results.  I agree with the assessment, diagnosis, and plan of care documented in the resident's note.

## 2013-12-17 NOTE — Progress Notes (Signed)
Case discussed with Dr. Kennerly soon after the resident saw the patient.  We reviewed the resident's history and exam and pertinent patient test results.  I agree with the assessment, diagnosis, and plan of care documented in the resident's note. 

## 2013-12-17 NOTE — Assessment & Plan Note (Signed)
He received the flu vaccine during this visit.

## 2013-12-17 NOTE — Progress Notes (Signed)
   Subjective:    Patient ID: Timothy Bradford, male    DOB: 10-Mar-1936, 78 y.o.   MRN: [id number]  HPI Timothy Bradford is a pleasant 78 year old man with PMH of systolic HF with EF of 34-35%, gout, and HTN who presents for follow up visit for bilateral ankle edema. He states that he took lasix 60mg  daily for 5 days, has kept his legs elevates when sitting, and has noticed improvement of the swelling of his bilateral ankles. He could not afford the compression stockings, states that he has an "old" pair that goes up to his thighs and is a little difficult to put on so he did not use it. He denies another occurrence of nosebleed.  He reports feeling well overall.     Review of Systems  Constitutional: Negative for fever, chills, diaphoresis, activity change, appetite change, fatigue and unexpected weight change.  HENT: Negative for rhinorrhea.   Respiratory: Negative for cough and shortness of breath.   Cardiovascular: Positive for leg swelling. Negative for chest pain and palpitations.  Gastrointestinal: Negative for abdominal pain.  Genitourinary: Positive for frequency. Negative for dysuria.       Increased urinary frequency with increased diuretic use  Neurological: Negative for dizziness and light-headedness.  Psychiatric/Behavioral: Negative for agitation.       Objective:   Physical Exam  Nursing note and vitals reviewed. Constitutional: He is oriented to person, place, and time. He appears well-developed and well-nourished. No distress.  Eyes: Conjunctivae are normal. No scleral icterus.  Cardiovascular: Normal rate and regular rhythm.   Pulmonary/Chest: Effort normal and breath sounds normal. No respiratory distress. He has no wheezes. He has no rales.  Abdominal: Soft. There is no tenderness.  Musculoskeletal: He exhibits edema. He exhibits no tenderness.  Bilateral lower extremity 1+ pitting edema from the ankle up to the mid shins, improved since his last visit  Neurological: He is  alert and oriented to person, place, and time. Coordination normal.  Skin: Skin is warm and dry. He is not diaphoretic.  Psychiatric: He has a normal mood and affect. His behavior is normal.          Assessment & Plan:

## 2013-12-18 ENCOUNTER — Other Ambulatory Visit: Payer: Self-pay | Admitting: Internal Medicine

## 2014-01-08 ENCOUNTER — Other Ambulatory Visit: Payer: Self-pay | Admitting: *Deleted

## 2014-01-08 MED ORDER — SIMVASTATIN 40 MG PO TABS: 40.0000 mg | ORAL_TABLET | Freq: Every day | ORAL | Status: DC

## 2014-02-05 ENCOUNTER — Other Ambulatory Visit: Payer: Self-pay | Admitting: Internal Medicine

## 2014-02-06 NOTE — Telephone Encounter (Signed)
As this patient was seen by Dr. Hayes Ludwig on 12/17/13 and deemed necessary for their treatment as documented in the EHR, I will refill this prescription for metoprolol. He is due to see me in clinic next month.

## 2014-02-21 ENCOUNTER — Other Ambulatory Visit: Payer: Self-pay | Admitting: Internal Medicine

## 2014-02-22 NOTE — Telephone Encounter (Signed)
I refilled a 90-day supply just a few weeks ago. He appears to have multiple prescriptions and wonder if he's just confused. Can we look into this? Thank you.

## 2014-03-22 ENCOUNTER — Encounter: Payer: Self-pay | Admitting: Internal Medicine

## 2014-03-22 ENCOUNTER — Ambulatory Visit (INDEPENDENT_AMBULATORY_CARE_PROVIDER_SITE_OTHER): Payer: Medicare Other | Admitting: Internal Medicine

## 2014-03-22 VITALS — BP 123/83 | HR 58 | Temp 97.3°F | Ht 73.0 in | Wt 186.2 lb

## 2014-03-22 DIAGNOSIS — E785 Hyperlipidemia, unspecified: Secondary | ICD-10-CM

## 2014-03-22 DIAGNOSIS — M25472 Effusion, left ankle: Secondary | ICD-10-CM

## 2014-03-22 DIAGNOSIS — Z8639 Personal history of other endocrine, nutritional and metabolic disease: Secondary | ICD-10-CM

## 2014-03-22 DIAGNOSIS — I10 Essential (primary) hypertension: Secondary | ICD-10-CM

## 2014-03-22 LAB — BASIC METABOLIC PANEL
BUN: 12 mg/dL (ref 6–23)
CALCIUM: 9.2 mg/dL (ref 8.4–10.5)
CO2: 25 mEq/L (ref 19–32)
CREATININE: 1.04 mg/dL (ref 0.50–1.35)
Chloride: 106 mEq/L (ref 96–112)
Glucose, Bld: 81 mg/dL (ref 70–99)
Potassium: 4.4 mEq/L (ref 3.5–5.3)
SODIUM: 141 meq/L (ref 135–145)

## 2014-03-22 NOTE — Progress Notes (Addendum)
   Subjective:    Patient ID: Timothy Bradford, male    DOB: [birth year], 78 y.o.   MRN: [id number]  HPI Timothy Bradford is a 78 year old male with CHF (EF 45-50%, Aug 2004), gout, arthritis, HTN who presents for a follow-up visit.  Please see assessment & plan for documentation of each problem.    Review of Systems  Constitutional: Negative for fever.  Respiratory: Negative for shortness of breath.   Cardiovascular: Positive for leg swelling. Negative for chest pain.  Neurological: Negative for dizziness.       Objective:   Physical Exam Constitutional: He is oriented to person, place, and time. He appears well-developed and well-nourished. No distress.  HENT:  Head: Normocephalic and atraumatic.  Eyes: Conjunctivae are normal. Pupils are equal, round, and reactive to light.  Cardiovascular: Normal rate, regular rhythm and normal heart sounds.  Exam reveals no gallop and no friction rub.   No murmur heard. Pulmonary/Chest: Effort normal. No respiratory distress. He has no wheezes. He has no rales.  Abdominal: Soft. Bowel sounds are normal. He exhibits no distension. There is no tenderness.  Neurological: He is alert and oriented to person, place, and time. No cranial nerve deficit. Coordination normal.  MSK: L > R though without erythema or tenderness. Trace 1+ pitting edema bilaterally. Skin: Skin is warm and dry. He is not diaphoretic.  Psychiatric: His behavior is normal.         Assessment & Plan:

## 2014-03-22 NOTE — Patient Instructions (Signed)
Thank you for bringing your medicines today. This helps [place] keep you safe from mistakes.   Your BP looks good today. Keep up the good work!  Please come back to see me in 6-12 months.    Progress Toward Treatment Goals:  Treatment Goal 12/17/2013  Blood pressure unchanged    Self Care Goals & Plans:  Self Care Goal 12/17/2013  Manage my medications refill my medications on time  Eat healthy foods -  Be physically active -  Meeting treatment goals maintain the current self-care plan    No flowsheet data found.   Care Management & Community Referrals:  Referral 12/17/2013  Referrals made for care management support none needed

## 2014-03-22 NOTE — Progress Notes (Signed)
Internal Medicine Clinic Attending  I saw and evaluated the patient.  I personally confirmed the key portions of the history and exam documented by Dr. Patel and I reviewed pertinent patient test results.  The assessment, diagnosis, and plan were formulated together and I agree with the documentation in the resident's note.  

## 2014-03-23 LAB — HEMOGLOBIN A1C
Hgb A1c MFr Bld: 5.4 % (ref <5.7)
Mean Plasma Glucose: 108 mg/dL (ref <117)

## 2014-03-23 NOTE — Assessment & Plan Note (Signed)
Will screen him for A1c as it has been several years since last checked  ADDENDUM 03/23/2014 4:57 PM:  -A1c 5.4; non-diabetic

## 2014-03-23 NOTE — Assessment & Plan Note (Addendum)
Overview At his last visit, home Lasix was increased 40mg ->60mg  x 5 days to reduce his leg swelling. Since then, he reports it has been stable and is not bothersome. Swelling improves overnight and worsens when he is on his feet. He does not report being short of breath during the day. Weight is back to 186 lbs, +3 lbs from prior visit, though he reports he usually weighs in the 180s.    Assessment -Stable otherwise  Plan -Continue monitoring symptoms at follow-up appointment -Check BMET   ADDENDUM 03/23/2014 4:56 PM:  -BMET unremarkable

## 2014-03-23 NOTE — Assessment & Plan Note (Signed)
BP Readings from Last 3 Encounters:  03/22/14 123/83  12/17/13 150/88  12/12/13 163/79    Lab Results  Component Value Date   NA 141 03/22/2014   K 4.4 03/22/2014   CREATININE 1.04 03/22/2014    Assessment: Blood pressure control: controlled Progress toward BP goal:  at goal Comments:  -BP normalized on recheck  Plan: Medications:  Lasix 40mg , metoprolol 25mg  BID Educational resources provided:   Self management tools provided:   Other plans:  -Continue current regimen

## 2014-04-05 HISTORY — PX: CATARACT EXTRACTION W/ INTRAOCULAR LENS  IMPLANT, BILATERAL: SHX1307

## 2014-04-10 ENCOUNTER — Other Ambulatory Visit: Payer: Self-pay | Admitting: Internal Medicine

## 2014-04-10 DIAGNOSIS — Z8639 Personal history of other endocrine, nutritional and metabolic disease: Secondary | ICD-10-CM | POA: Insufficient documentation

## 2014-04-10 NOTE — Addendum Note (Signed)
Addended by: Riccardo Dubin on: 04/10/2014 03:52 PM   Modules accepted: Miquel Dunn

## 2014-04-10 NOTE — Addendum Note (Signed)
Addended by: Riccardo Dubin on: 04/10/2014 03:14 PM   Modules accepted: Miquel Dunn

## 2014-04-10 NOTE — Telephone Encounter (Signed)
As this patient was seen by me on 12/18 and deemed necessary for their treatment as documented in the EHR, I will refill this prescription for Timothy Bradford. I think he was taking only one tablet instead of two as he had 180 tablets which would have run out by November since it was last filled August. Last BMET was unremarkable.

## 2014-04-10 NOTE — Addendum Note (Signed)
Addended by: Riccardo Dubin on: 04/10/2014 02:54 PM   Modules accepted: Miquel Dunn

## 2014-04-10 NOTE — Addendum Note (Signed)
Addended by: Riccardo Dubin on: 04/10/2014 02:31 PM   Modules accepted: Miquel Dunn

## 2014-05-04 ENCOUNTER — Other Ambulatory Visit: Payer: Self-pay | Admitting: Internal Medicine

## 2014-05-07 NOTE — Telephone Encounter (Signed)
As this patient was seen by me on 03/14/14 and deemed necessary for their treatment as documented in the EHR, I will refill this prescription for Lasix 40mg .

## 2014-07-18 ENCOUNTER — Other Ambulatory Visit: Payer: Self-pay | Admitting: *Deleted

## 2014-07-19 MED ORDER — FUROSEMIDE 40 MG PO TABS: 40.0000 mg | ORAL_TABLET | Freq: Every day | ORAL | Status: DC

## 2014-07-19 NOTE — Telephone Encounter (Signed)
Call from pt's friend - states he had been taking 1.5 tablet and now he's completely out.

## 2014-07-19 NOTE — Telephone Encounter (Signed)
As this patient was seen by me on 03/22/14 and deemed necessary for their treatment as documented in the EHR, I will refill this prescription for Lasix. I will ask pharmacy to clarify with him his current dosage.

## 2014-07-24 ENCOUNTER — Other Ambulatory Visit: Payer: Self-pay | Admitting: *Deleted

## 2014-09-13 ENCOUNTER — Ambulatory Visit (INDEPENDENT_AMBULATORY_CARE_PROVIDER_SITE_OTHER): Payer: Medicare Other | Admitting: Internal Medicine

## 2014-09-13 ENCOUNTER — Other Ambulatory Visit: Payer: Self-pay | Admitting: Internal Medicine

## 2014-09-13 ENCOUNTER — Encounter: Payer: Self-pay | Admitting: Internal Medicine

## 2014-09-13 VITALS — BP 152/81 | HR 66 | Temp 98.0°F | Ht 73.0 in | Wt 196.1 lb

## 2014-09-13 DIAGNOSIS — I5032 Chronic diastolic (congestive) heart failure: Secondary | ICD-10-CM | POA: Diagnosis not present

## 2014-09-13 DIAGNOSIS — M109 Gout, unspecified: Secondary | ICD-10-CM | POA: Diagnosis not present

## 2014-09-13 DIAGNOSIS — Z23 Encounter for immunization: Secondary | ICD-10-CM | POA: Diagnosis not present

## 2014-09-13 MED ORDER — POTASSIUM CHLORIDE CRYS ER 20 MEQ PO TBCR: 20.0000 meq | EXTENDED_RELEASE_TABLET | Freq: Once | ORAL | Status: DC

## 2014-09-13 MED ORDER — FUROSEMIDE 40 MG PO TABS
ORAL_TABLET | ORAL | Status: DC
Start: 2014-09-13 — End: 2014-11-15

## 2014-09-13 NOTE — Assessment & Plan Note (Signed)
He was agreeable to receiving Prevnar 13 Valent vaccine as he had received the 23 Valent vaccine back in 2010.

## 2014-09-13 NOTE — Assessment & Plan Note (Signed)
Though was not discussed extensively, it should be assessed at his follow-up visit how often he is having attacks since his last uric acid level was 9.2 in April 2015.

## 2014-09-13 NOTE — Progress Notes (Signed)
   Subjective:    Patient ID: Timothy Bradford, male    DOB: 1936/01/28, 79 y.o.   MRN: [id number]  HPI Timothy Bradford is a 79 year old male with CHF (EF 45-50%, Aug 2004), gout, arthritis, HTN who presents for a follow-up visit.  Please see assessment & plan for documentation of each problem.   Review of Systems  Respiratory: Negative for shortness of breath.   Cardiovascular: Positive for leg swelling. Negative for chest pain.  Gastrointestinal: Negative for nausea, vomiting, abdominal pain, diarrhea and blood in stool.  Genitourinary: Negative for dysuria.  Neurological: Negative for dizziness.       Objective:   Physical Exam Constitutional: He is oriented to person, place, and time. He appears well-developed and well-nourished. No distress.  HENT:  Head: Normocephalic and atraumatic.  Eyes: Conjunctivae are normal. Pupils are equal, round, and reactive to light.  Cardiovascular: Normal rate, regular rhythm and normal heart sounds.  Exam reveals no gallop and no friction rub.   No murmur heard. Pulmonary/Chest: Effort normal. No respiratory distress. He has no wheezes. He has no rales.  Abdominal: Soft. Bowel  sounds are normal. He exhibits no distension. There is no tenderness.  Neurological: He is alert and oriented to person, place, and time. No cranial nerve deficit. Coordination normal.  Skin: Skin is warm and dry. He is not diaphoretic.  Extremities: Bilateral 2+ pitting edema right greater than left. No pain on palpation. Psychiatric: His behavior is normal.          Assessment & Plan:

## 2014-09-13 NOTE — Assessment & Plan Note (Addendum)
He reports his legs have increased swelling over the last 2 weeks though denies any shortness of breath, orthopnea, chest pain, nausea, abdominal pain. He is agreeable to doubling his dose of Lasix from 40 daily to 80 mg with plan to follow-up next week to reassess his weight. He was also given instructions to double his dose should his weight increased by 3 pounds in 1 day or 5 pounds in 1 week based off the dry weight he is assessed at his follow-up visit. We will also repeat echo given his worsening lower extremity edema.

## 2014-09-13 NOTE — Patient Instructions (Signed)
Thank you for bringing your medicines today. This helps [place] keep you safe from mistakes.   Take two tablets when weight goes up by 3 lbs in one day or 5 lbs after one week. You will get your dry weight next week in office.    Progress Toward Treatment Goals:  Treatment Goal 03/22/2014  Blood pressure at goal    Self Care Goals & Plans:  Self Care Goal 12/17/2013  Manage my medications refill my medications on time  Eat healthy foods -  Be physically active -  Meeting treatment goals maintain the current self-care plan    No flowsheet data found.   Care Management & Community Referrals:  Referral 12/17/2013  Referrals made for care management support none needed

## 2014-09-16 NOTE — Progress Notes (Signed)
Internal Medicine Clinic Attending  Case discussed with Dr. Patel at the time of the visit.  We reviewed the resident's history and exam and pertinent patient test results.  I agree with the assessment, diagnosis, and plan of care documented in the resident's note.  

## 2014-09-19 ENCOUNTER — Ambulatory Visit (INDEPENDENT_AMBULATORY_CARE_PROVIDER_SITE_OTHER): Payer: Medicare Other | Admitting: Internal Medicine

## 2014-09-19 ENCOUNTER — Encounter: Payer: Self-pay | Admitting: Internal Medicine

## 2014-09-19 VITALS — BP 132/76 | HR 73 | Temp 98.0°F | Ht 73.0 in | Wt 194.0 lb

## 2014-09-19 DIAGNOSIS — I872 Venous insufficiency (chronic) (peripheral): Secondary | ICD-10-CM

## 2014-09-19 DIAGNOSIS — R600 Localized edema: Secondary | ICD-10-CM | POA: Diagnosis not present

## 2014-09-19 DIAGNOSIS — I5032 Chronic diastolic (congestive) heart failure: Secondary | ICD-10-CM | POA: Diagnosis not present

## 2014-09-19 DIAGNOSIS — M109 Gout, unspecified: Secondary | ICD-10-CM

## 2014-09-19 LAB — COMPLETE METABOLIC PANEL WITH GFR
ALT: 17 U/L (ref 0–53)
AST: 25 U/L (ref 0–37)
Albumin: 4.5 g/dL (ref 3.5–5.2)
Alkaline Phosphatase: 65 U/L (ref 39–117)
BILIRUBIN TOTAL: 0.7 mg/dL (ref 0.2–1.2)
BUN: 13 mg/dL (ref 6–23)
CO2: 21 meq/L (ref 19–32)
CREATININE: 1 mg/dL (ref 0.50–1.35)
Calcium: 9.7 mg/dL (ref 8.4–10.5)
Chloride: 108 mEq/L (ref 96–112)
GFR, EST AFRICAN AMERICAN: 84 mL/min
GFR, Est Non African American: 73 mL/min
GLUCOSE: 55 mg/dL — AB (ref 70–99)
Potassium: 4.5 mEq/L (ref 3.5–5.3)
Sodium: 141 mEq/L (ref 135–145)
Total Protein: 8 g/dL (ref 6.0–8.3)

## 2014-09-19 LAB — URIC ACID: URIC ACID, SERUM: 8.1 mg/dL — AB (ref 4.0–7.8)

## 2014-09-19 NOTE — Assessment & Plan Note (Signed)
-   Has had no further attacks, no indication at this time for urate lowering therapy, will recheck a Uric acid level as requested by PCP.

## 2014-09-19 NOTE — Patient Instructions (Signed)
General Instructions:  I am sending you for a repeat picture of your heart (Echocardiogram), I want you to go back down to taking 40mg  of Lasix a day.  We will try to fit you for compression stockings, please put these on in the morning when you get up and keep them on during the day.  Please bring your medicines with you each time you come to clinic.  Medicines may include prescription medications, over-the-counter medications, herbal remedies, eye drops, vitamins, or other pills.   Progress Toward Treatment Goals:  Treatment Goal 03/22/2014  Blood pressure at goal    Self Care Goals & Plans:  Self Care Goal 09/19/2014  Manage my medications take my medicines as prescribed; bring my medications to every visit; refill my medications on time  Monitor my health keep track of my weight; keep track of my blood pressure  Eat healthy foods eat more vegetables; eat foods that are low in salt  Be physically active find an activity I enjoy  Meeting treatment goals -    No flowsheet data found.   Care Management & Community Referrals:  Referral 12/17/2013  Referrals made for care management support none needed

## 2014-09-19 NOTE — Assessment & Plan Note (Signed)
-   His lungs are CTA and has 100% saturation on RA, do not suspect overt left heart failure, I suspect his lower extremity swelling is due to venous insuffiencey and will try compression stockings.  Return lasix to 40mg  daily. - Repeat Echocardiogram

## 2014-09-19 NOTE — Assessment & Plan Note (Signed)
-   I suspect this is all due to venous insuffiencey.  Will cover basis and repeat an Echo of his heart (last one was 2004).  Will obtain a CMP (check albumin).  He does have lower extremity asymmetry without other risk factors for DVT (his wells -DVT score is 0) will not check a doppler as he is so low risk, (will reconsider if does not improve with compression stockings.

## 2014-09-19 NOTE — Progress Notes (Signed)
[facility] Subjective:   Patient ID: Timothy Bradford male   DOB: 11/19/1935 79 y.o.   MRN: [id number]  HPI: [name] is a 79 y.o. male with a PMH detailed below who presents for 1 week follow up of his CHF. He was seen last week by his PCP Dr. Posey Pronto for increased lower extremity edema x about 1 month.  He was instructed to double his lasix dose and return in 1 week for weight recheck.  He notes he has been urinating frequently but reports this has not improved his lower extremity swelling.  He reports his is able to do all ADLs with no SOB, in fact he is able to walk about 2.5 miles without getting SOB.  He reports that his legs are less swollen in the morning when he gets out of bed and get worse standing on them.  He has previously been diagnosed with chronic venous insuffiencey and reports he at one point had compression stockings up to his groin.  He denies any history of DVT or PE, denies any calf pain.  He has had no trauma or prolonged immobilization.    Past Medical History  Diagnosis Date  . Hypertension   . Hyperlipidemia   . Osteoarthritis   . Erectile dysfunction   . Chronic venous insufficiency   . CHF 7078    Systolic failure. Cardiac cath in 2004: The LV showed mild global hypokinesia. EF of 45-50%. LVEDP was 12 mmHg. Left main was long which was patent. LAD has 10% distal stenosis. The vessel proximally was very tortuous. Diagonal one was small which was patent. Left circumflex was patent proximally and in mid portion and then tapers down in AV groove after giving off OM-2. OM-1 was large which was p  . Seborrheic keratoses     Multiple. Sees Lyndle Herrlich   Current Outpatient Prescriptions  Medication Sig Dispense Refill  . aspirin EC 81 MG tablet Take 81 mg by mouth daily.    . furosemide (LASIX) 40 MG tablet Take 1 tablet once a day. Take 2 tablets when weight increases. 90 tablet 1  . metoprolol tartrate (LOPRESSOR) 25 MG tablet TAKE 1  TABLET BY MOUTH TWO TIMES DAILY 180 tablet 3  . potassium chloride SA (KLOR-CON M20) 20 MEQ tablet Take 1 tablet (20 mEq total) by mouth once. 90 tablet 1  . simvastatin (ZOCOR) 40 MG tablet Take 1 tablet (40 mg total) by mouth at bedtime. 90 tablet 4  . [DISCONTINUED] potassium chloride (KLOR-CON) 20 MEQ packet Take 1 tablet by mouth twice a day. 90 tablet 3   No current facility-administered medications for this visit.   No family history on file. History   Social History  . Marital Status: Married    Spouse Name: N/A  . Number of Children: N/A  . Years of Education: N/A   Social History Main Topics  . Smoking status: Former Smoker    Quit date: 03/16/1988  . Smokeless tobacco: Not on file  . Alcohol Use: No     Comment: Quit 1999, h/o DT's and szs. No ETOH since 1999  . Drug Use: No  . Sexual Activity: Not on file   Other Topics Concern  . None   Social History Narrative   Review of Systems: Review of Systems  Constitutional: Positive for weight loss (2 lbs). Negative for fever, chills and malaise/fatigue.  HENT: Negative for congestion.   Eyes: Negative for blurred vision.  Respiratory: Negative for cough, sputum  production and shortness of breath.   Cardiovascular: Positive for leg swelling. Negative for chest pain and palpitations.  Gastrointestinal: Negative for heartburn, nausea and abdominal pain.  Genitourinary: Positive for frequency. Negative for dysuria.  Skin: Negative for rash.  Neurological: Negative for dizziness and headaches.  Psychiatric/Behavioral: Negative for depression.     Objective:  Physical Exam: Filed Vitals:   09/19/14 0904  BP: 132/76  Pulse: 73  Temp: 98 F (36.7 C)  TempSrc: Oral  Height: 6\' 1"  (1.854 m)  Weight: 194 lb (87.998 kg)  SpO2: 100%  Physical Exam  Constitutional: He is well-developed, well-nourished, and in no distress. No distress.  HENT:  Head: Normocephalic and atraumatic.  Eyes: Conjunctivae are normal.   Cardiovascular: Normal rate, regular rhythm, normal heart sounds and intact distal pulses.   No murmur heard. Pulmonary/Chest: Effort normal and breath sounds normal. No respiratory distress. He has no wheezes. He has no rales.  Abdominal: Soft. Bowel sounds are normal. He exhibits no distension. There is no tenderness.  Musculoskeletal: He exhibits edema (2+ pitting edema of  RLE and 1+ pitting edema of LLE to knee bilaterally).  Skin: Skin is warm and dry. He is not diaphoretic.  Psychiatric: Affect and judgment normal.  Nursing note and vitals reviewed.   Assessment & Plan:  Case discussed with Dr. Daryll Drown  Chronic venous insufficiency - I suspect this is the cause of his lower extremity swelling.  - Will obtain compression stockings to at least knee and have him back in a month for reevaluation.  Bilateral lower extremity edema - I suspect this is all due to venous insuffiencey.  Will cover basis and repeat an Echo of his heart (last one was 2004).  Will obtain a CMP (check albumin).  He does have lower extremity asymmetry without other risk factors for DVT (his wells -DVT score is 0) will not check a doppler as he is so low risk, (will reconsider if does not improve with compression stockings.  Gout - Has had no further attacks, no indication at this time for urate lowering therapy, will recheck a Uric acid level as requested by PCP.  Chronic diastolic congestive heart failure - His lungs are CTA and has 100% saturation on RA, do not suspect overt left heart failure, I suspect his lower extremity swelling is due to venous insuffiencey and will try compression stockings.  Return lasix to 40mg  daily. - Repeat Echocardiogram    Medications Ordered No orders of the defined types were placed in this encounter.   Other Orders Orders Placed This Encounter  Procedures  . Compression stockings  . CMP with Estimated GFR (CPT-80053)  . Uric acid  . Echocardiogram    Standing Status:  Future     Number of Occurrences:      Standing Expiration Date: 12/20/2015    Order Specific Question:  Where should this test be performed    Answer:  [facility]    Order Specific Question:  Complete or Limited study?    Answer:  Complete    Order Specific Question:  With Image Enhancing Agent or without Image Enhancing Agent?    Answer:  With Image Enhancing Agent    Order Specific Question:  Reason for exam-Echo    Answer:  Congestive Heart Failure  428.0 / I50.9

## 2014-09-19 NOTE — Progress Notes (Signed)
Teds hose placed in mail today.

## 2014-09-19 NOTE — Assessment & Plan Note (Signed)
-   I suspect this is the cause of his lower extremity swelling.  - Will obtain compression stockings to at least knee and have him back in a month for reevaluation.

## 2014-09-24 NOTE — Progress Notes (Signed)
Internal Medicine Clinic Attending  Case discussed with Dr. Hoffman soon after the resident saw the patient.  We reviewed the resident's history and exam and pertinent patient test results.  I agree with the assessment, diagnosis, and plan of care documented in the resident's note. 

## 2014-10-02 ENCOUNTER — Ambulatory Visit (HOSPITAL_COMMUNITY)
Admission: RE | Admit: 2014-10-02 | Discharge: 2014-10-02 | Disposition: A | Discharge status: Home / Self Care | Payer: Medicare Other | Source: Ambulatory Visit | Attending: Internal Medicine | Admitting: Internal Medicine

## 2014-10-02 DIAGNOSIS — I5032 Chronic diastolic (congestive) heart failure: Secondary | ICD-10-CM | POA: Insufficient documentation

## 2014-10-02 DIAGNOSIS — Z87891 Personal history of nicotine dependence: Secondary | ICD-10-CM | POA: Diagnosis not present

## 2014-10-02 DIAGNOSIS — E785 Hyperlipidemia, unspecified: Secondary | ICD-10-CM | POA: Diagnosis not present

## 2014-10-02 DIAGNOSIS — I10 Essential (primary) hypertension: Secondary | ICD-10-CM | POA: Insufficient documentation

## 2014-10-02 NOTE — Progress Notes (Signed)
*  PRELIMINARY RESULTS* Echocardiogram 2D Echocardiogram has been performed.  Leavy Cella 10/02/2014, 11:17 AM

## 2014-11-15 ENCOUNTER — Ambulatory Visit (INDEPENDENT_AMBULATORY_CARE_PROVIDER_SITE_OTHER): Payer: Medicare Other | Admitting: Internal Medicine

## 2014-11-15 ENCOUNTER — Encounter: Payer: Self-pay | Admitting: Internal Medicine

## 2014-11-15 VITALS — BP 158/72 | HR 62 | Temp 97.9°F | Ht 73.0 in | Wt 192.3 lb

## 2014-11-15 DIAGNOSIS — I5032 Chronic diastolic (congestive) heart failure: Secondary | ICD-10-CM

## 2014-11-15 DIAGNOSIS — F528 Other sexual dysfunction not due to a substance or known physiological condition: Secondary | ICD-10-CM

## 2014-11-15 DIAGNOSIS — I10 Essential (primary) hypertension: Secondary | ICD-10-CM

## 2014-11-15 DIAGNOSIS — I872 Venous insufficiency (chronic) (peripheral): Secondary | ICD-10-CM

## 2014-11-15 MED ORDER — SILDENAFIL CITRATE 20 MG PO TABS: 20.0000 mg | ORAL_TABLET | ORAL | Status: DC | PRN

## 2014-11-15 MED ORDER — SILDENAFIL CITRATE 25 MG PO TABS: 25.0000 mg | ORAL_TABLET | ORAL | Status: DC | PRN

## 2014-11-15 NOTE — Patient Instructions (Addendum)
Please take Revatio 30-60 minutes prior to when you and your wife get intimate.  STOP taking Lasix.   We will see you back in a month.

## 2014-11-15 NOTE — Assessment & Plan Note (Addendum)
His bilateral lower extremity edema has been improvement compression stockings. I advised him to continue elevating his legs when he feels his swelling is worsening for intervals of 30 minutes at a time. He also acknowledged exercising by walking for 90 minutes a day when it is not too warm outside which I explained to him is also another means to preventing worsening of his edema.

## 2014-11-15 NOTE — Assessment & Plan Note (Signed)
BP Readings from Last 3 Encounters:  11/15/14 158/72  09/19/14 132/76  09/13/14 152/81    Lab Results  Component Value Date   NA 141 09/19/2014   K 4.5 09/19/2014   CREATININE 1.00 09/19/2014    Assessment: Blood pressure control: mildly elevated Progress toward BP goal:  at goal Blood pressure is somewhat elevated today though he reports he has been taking his medication regularly..  Plan: Medications:  metoprolol 25 mg twice daily, Lasix 40 mg daily Educational resources provided:   Self management tools provided:   Other plans:  Continue current medications with plans to recheck at next visit. Can consider switching him off metoprolol to potentially a thiazide diuretic is sexual dysfunction continues to be an issue for him.

## 2014-11-15 NOTE — Assessment & Plan Note (Addendum)
He reports that he has been taking Lasix 40 mg daily. Per review of chart, his weight has remained stable in the 190s over the last several visits. He denies any dyspnea worse with exertion or anginal type chest pain. I do not see evidence of him having heart failure exacerbations in the past and explained to him echocardiogram. As such, I advised him to discontinue Lasix and follow-up in 1 month at which time we can reassess to see if he is having symptoms consistent with heart failure.

## 2014-11-15 NOTE — Progress Notes (Signed)
   Subjective:    Patient ID: Montrice Gracey, male    DOB: 01/24/1936, 79 y.o.   MRN: [id number]  HPI Mr. Oldaker is a 79 year old male with chronic venous insufficiency, chronic diastolic CHF, hypertension who presents today for follow-up visit. Please see assessment & plan for documentation of each problem.   Review of Systems  Respiratory: Negative for shortness of breath.   Cardiovascular: Negative for chest pain and leg swelling.  Neurological: Negative for dizziness.       Objective:   Physical Exam Constitutional: Elderly African-American male. No distress.  HENT:  Head: Normocephalic and atraumatic.  Eyes: Conjunctivae are normal. Pupils are 4 mm, direct, consensual, near.  Cardiovascular: Normal rate, regular rhythm and normal heart sounds.  Exam reveals no gallop and no friction rub. No murmur heard. Pulmonary/Chest: Effort normal. No respiratory distress. He has no wheezes. He has no rales.  Abdominal: Soft. Bowel sounds are normal. He exhibits no distension. There is no tenderness.  Neurological: He is alert and oriented to person, place, and time. Coordination normal.  Skin: Skin is warm and dry. He is not diaphoretic.  Psychiatric: His behavior is normal.          Assessment & Plan:

## 2014-11-15 NOTE — Assessment & Plan Note (Signed)
He reports to me today that it is difficult for him to be intimate with his wife because he cannot maintain an erection. No anxiety or psychological stressor that may be contributing, and he reports that he has been struggling with this for several years though is embarrassed to mention it to me. Per review of his prior office visits, it appeared he was treated with tadalafil, last refilled in December 2013, though his insurance does not cover any PDE 5 inhibitor. I will prescribe sildenafil 20 mg 5 tablets and provide him with a prescription discount card. I explained to him side effects of this medication, including hypertension and priapism. We will reassess at his one-month follow-up how he is tolerating this medication.

## 2014-11-18 NOTE — Progress Notes (Signed)
Internal Medicine Clinic Attending  Case discussed with Dr. Patel,Rushil soon after the resident saw the patient.  We reviewed the resident's history and exam and pertinent patient test results.  I agree with the assessment, diagnosis, and plan of care documented in the resident's note. 

## 2014-11-20 ENCOUNTER — Telehealth: Payer: Self-pay | Admitting: Internal Medicine

## 2014-11-20 NOTE — Telephone Encounter (Signed)
Called pharmacy and PA will be done on Friday.

## 2014-11-20 NOTE — Telephone Encounter (Signed)
Pharmacy calling to check the status of a prior authorization.

## 2014-11-22 ENCOUNTER — Telehealth: Payer: Self-pay | Admitting: *Deleted

## 2014-11-22 NOTE — Telephone Encounter (Signed)
ED is not a covered diagnosis-pt does not meet criteria to have medication covered by plan.  Pt was given Good Rx card during last visit-rx will cost about $12 the [facility], pt and pharmacy aware.Despina Hidden Cassady8/19/20164:20 PM

## 2014-12-27 ENCOUNTER — Ambulatory Visit (INDEPENDENT_AMBULATORY_CARE_PROVIDER_SITE_OTHER): Payer: Medicare Other | Admitting: Internal Medicine

## 2014-12-27 ENCOUNTER — Encounter: Payer: Self-pay | Admitting: Internal Medicine

## 2014-12-27 VITALS — BP 148/78 | HR 5 | Temp 98.3°F | Ht 74.0 in | Wt 194.0 lb

## 2014-12-27 DIAGNOSIS — F528 Other sexual dysfunction not due to a substance or known physiological condition: Secondary | ICD-10-CM

## 2014-12-27 DIAGNOSIS — N529 Male erectile dysfunction, unspecified: Secondary | ICD-10-CM | POA: Diagnosis not present

## 2014-12-27 DIAGNOSIS — Z23 Encounter for immunization: Secondary | ICD-10-CM

## 2014-12-27 DIAGNOSIS — I10 Essential (primary) hypertension: Secondary | ICD-10-CM | POA: Diagnosis not present

## 2014-12-27 DIAGNOSIS — E785 Hyperlipidemia, unspecified: Secondary | ICD-10-CM

## 2014-12-27 DIAGNOSIS — I5032 Chronic diastolic (congestive) heart failure: Secondary | ICD-10-CM

## 2014-12-27 MED ORDER — SIMVASTATIN 40 MG PO TABS: 40.0000 mg | ORAL_TABLET | Freq: Every day | ORAL | Status: DC

## 2014-12-27 NOTE — Assessment & Plan Note (Signed)
Overview He denies any symptoms of dyspnea or exercise intolerance and walks every morning with his wife after breakfast.   Assessment Controlled diastolic CHF. EF 50-55% with grade 1 diastolic dysfunction per echo 10/02/14 and no prior acute CHF exacerbations per chart review.  Plan Discontinued Lilly today and advised him to call us were he to experience any dyspnea or exercise intolerance.

## 2014-12-27 NOTE — Progress Notes (Signed)
   Subjective:    Patient ID: Timothy Bradford, male    DOB: Sep 04, 1935, 79 y.o.   MRN: [id number]  HPI Timothy Bradford is a 79 year old male with chronic venous insufficiency, chronic diastolic CHF, hypertension who presents today for follow-up visit. Please see assessment & plan for documentation of each problem.   Review of Systems  Respiratory: Negative for shortness of breath.   Cardiovascular: Negative for chest pain and leg swelling.  Gastrointestinal: Negative for nausea, vomiting, abdominal pain, diarrhea and blood in stool.  Neurological: Negative for dizziness.       Objective:   Physical Exam  Constitutional: Elderly African American male. No distress.  Head: Normocephalic and atraumatic.  Eyes: Conjunctivae are normal. Pupils are 3 mm, direct, consensual, near.  Cardiovascular: Normal rate, regular rhythm and normal heart sounds.  No gallop, friction rub, murmur heard. Pulmonary/Chest: Effort normal. No respiratory distress. No wheezes, rales.  Abdominal: Soft. Bowel sounds are normal. No distension. No tenderness.  Neurological: Alert and oriented to person, place, and time. Coordination normal.  Extremities: Covered in compression stockings. 1+ pitting edema R>L. Skin: Warm and dry. Not diaphoretic.  Psychiatric: Affect appropriate.        Assessment & Plan:

## 2014-12-27 NOTE — Assessment & Plan Note (Signed)
He was unable to afford the Revatio and understands we don't have any other options other than an urology referral.

## 2014-12-27 NOTE — Progress Notes (Signed)
Internal Medicine Clinic Attending  Case discussed with Dr. Patel,Rushil at the time of the visit.  We reviewed the resident's history and exam and pertinent patient test results.  I agree with the assessment, diagnosis, and plan of care documented in the resident's note.  

## 2014-12-27 NOTE — Patient Instructions (Addendum)
General Instructions:   Thank you for bringing your medicines today. This helps [place] keep you safe from mistakes.   STOP Lasix and Kdur. See you back in 6-12 months!   Progress Toward Treatment Goals:  Treatment Goal 11/15/2014  Blood pressure at goal    Self Care Goals & Plans:  Self Care Goal 12/27/2014  Manage my medications take my medicines as prescribed; bring my medications to every visit; refill my medications on time  Monitor my health keep track of my blood pressure  Eat healthy foods drink diet soda or water instead of juice or soda; eat more vegetables; eat foods that are low in salt; eat baked foods instead of fried foods  Be physically active take a walk every day  Meeting treatment goals -    No flowsheet data found.   Care Management & Community Referrals:  Referral 12/17/2013  Referrals made for care management support none needed

## 2014-12-27 NOTE — Assessment & Plan Note (Signed)
He was agreeable to receiving his flu shot today as well as Zostavax at some point in the future. I will go ahead and prescribe it.

## 2014-12-27 NOTE — Assessment & Plan Note (Signed)
Refilled Zocor 40mg .

## 2014-12-27 NOTE — Assessment & Plan Note (Signed)
  BP Readings from Last 3 Encounters:  12/27/14 148/78  11/15/14 158/72  09/19/14 132/76    Lab Results  Component Value Date   NA 141 09/19/2014   K 4.5 09/19/2014   CREATININE 1.00 09/19/2014    Assessment: Blood pressure control: controlled (BP initially 160/84 though 148/78 on repeat. ) Progress toward BP goal:  at goal Comments: Stable on metoprolol alone.  Plan: Medications:  metoprolol 25mg  twice daily Educational resources provided:   Self management tools provided:   Other plans: Continue current regimen.

## 2015-03-03 ENCOUNTER — Other Ambulatory Visit: Payer: Self-pay | Admitting: Internal Medicine

## 2015-03-04 MED ORDER — METOPROLOL TARTRATE 25 MG PO TABS: 25.0000 mg | ORAL_TABLET | Freq: Two times a day (BID) | ORAL | Status: DC

## 2015-03-04 NOTE — Telephone Encounter (Signed)
As this patient was seen by me on 12/27/14 and deemed necessary for their treatment as documented in the EHR, I will refill this prescription for metoprolol.

## 2015-03-19 ENCOUNTER — Other Ambulatory Visit: Payer: Self-pay | Admitting: Internal Medicine

## 2015-03-19 NOTE — Telephone Encounter (Signed)
As this patient was seen by me on 11/15/14 and deemed necessary for their treatment as documented in the EHR, I will refill this prescription for simvastatin. Though I refilled all his medications for a 12 month supply back in August, prescription was sent to pharmacy different than the one for which the current one is coming from. I called the pharmacy to make them aware of this issue. They do not want to deactivate the prescription however.

## 2015-05-22 ENCOUNTER — Encounter: Payer: Self-pay | Admitting: Internal Medicine

## 2015-05-22 ENCOUNTER — Ambulatory Visit (HOSPITAL_COMMUNITY)
Admission: RE | Admit: 2015-05-22 | Discharge: 2015-05-22 | Disposition: A | Discharge status: Home / Self Care | Payer: Medicare Other | Source: Ambulatory Visit | Attending: Oncology | Admitting: Oncology

## 2015-05-22 ENCOUNTER — Ambulatory Visit (INDEPENDENT_AMBULATORY_CARE_PROVIDER_SITE_OTHER): Payer: Medicare Other | Admitting: Internal Medicine

## 2015-05-22 VITALS — BP 164/88 | HR 74 | Temp 98.0°F | Wt 197.7 lb

## 2015-05-22 DIAGNOSIS — Z87891 Personal history of nicotine dependence: Secondary | ICD-10-CM

## 2015-05-22 DIAGNOSIS — L821 Other seborrheic keratosis: Secondary | ICD-10-CM

## 2015-05-22 DIAGNOSIS — Z85828 Personal history of other malignant neoplasm of skin: Secondary | ICD-10-CM

## 2015-05-22 DIAGNOSIS — IMO0002 Reserved for concepts with insufficient information to code with codable children: Secondary | ICD-10-CM

## 2015-05-22 DIAGNOSIS — R2232 Localized swelling, mass and lump, left upper limb: Secondary | ICD-10-CM | POA: Insufficient documentation

## 2015-05-22 DIAGNOSIS — C799 Secondary malignant neoplasm of unspecified site: Secondary | ICD-10-CM

## 2015-05-22 NOTE — Patient Instructions (Signed)
General Instructions:  I am referring you over to the surgeons for a biopsy.  Please let me know if you do not hear from their office in the next week.  Thank you for bringing your medicines today. This helps [place] keep you safe from mistakes.   Progress Toward Treatment Goals:  Treatment Goal 12/27/2014  Blood pressure at goal    Self Care Goals & Plans:  Self Care Goal 05/22/2015  Manage my medications take my medicines as prescribed; bring my medications to every visit; refill my medications on time  Monitor my health keep track of my blood pressure  Eat healthy foods eat more vegetables; eat foods that are low in salt; eat baked foods instead of fried foods  Be physically active find an activity I enjoy  Meeting treatment goals -    No flowsheet data found.   Care Management & Community Referrals:  Referral 12/27/2014  Referrals made for care management support none needed

## 2015-05-22 NOTE — Progress Notes (Signed)
Medicine attending: I personally interviewed and briefly examined this patient on the day of the patient visit and reviewed pertinent clinical ,laboratory, and radiographic data  with resident physician Dr. Joni Reining and we discussed a management plan. 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  No lesions in the oropharynx. No cervical, supraclavicular, right axillary, or inguinal adenopathy. No splenomegaly.  6x6 cm hard, movable mass in left axilla. Large scar left upper back. Extensive seborrheic keratoses on skin of chest and back. Findings suspicious for locally metastatic recurrence of squamous cell carcinoma. Plan: CXR & lab today. Surgery referral for biopsy. When dx confirmed, refer to Oncology for staging eval and Rx.

## 2015-05-22 NOTE — Progress Notes (Signed)
[facility] Subjective:   Patient ID: Timothy Bradford male   DOB: 1936/03/18 80 y.o.   MRN: [id number]  HPI: Timothy Bradford is a 80 y.o. male with a PMH detailed below who presents for left axillary nodule  He reports he has noticed a nodule in his left armpit  He is not sure how long this has been there for but he has noticed it for at least 2 months.  He recently told his wife who told him to come in to be seen.  He denies any redness warmth or pain in the area.  He denies any fever chills nausea vomiting or night sweats. He does report he is a former smoker, smoked 1ppd for 10 years, quit 7 years ago. He has a history of Squamous cell CA (initally reported in chart as in-situ however after speaking with [facility] at [facility] was invasive Sq Cell CA removed with clear margins.)     Past Medical History  Diagnosis Date  . Hypertension   . Hyperlipidemia   . Osteoarthritis   . Erectile dysfunction   . Chronic venous insufficiency   . CHF [id number]    Systolic failure. Cardiac cath in 2004: The LV showed mild global hypokinesia. EF of 45-50%. LVEDP was 12 mmHg. Left main was long which was patent. LAD has 10% distal stenosis. The vessel proximally was very tortuous. Diagonal one was small which was patent. Left circumflex was patent proximally and in mid portion and then tapers down in AV groove after giving off OM-2. OM-1 was large which was p  . Seborrheic keratoses     Multiple. Sees Lyndle Herrlich   Current Outpatient Prescriptions  Medication Sig Dispense Refill  . aspirin EC 81 MG tablet Take 81 mg by mouth daily.    . metoprolol tartrate (LOPRESSOR) 25 MG tablet Take 1 tablet (25 mg total) by mouth 2 (two) times daily. 180 tablet 3  . simvastatin (ZOCOR) 40 MG tablet TAKE 1 TABLET BY MOUTH AT BEDTIME 90 tablet 3  . [DISCONTINUED] potassium chloride (KLOR-CON) 20 MEQ packet Take 1 tablet by mouth twice a day. 90 tablet 3   No current facility-administered  medications for this visit.   No family history on file. Social History   Social History  . Marital Status: Married    Spouse Name: N/A  . Number of Children: N/A  . Years of Education: N/A   Social History Main Topics  . Smoking status: Former Smoker -- 1.50 packs/day for 4.5 years    Types: Cigarettes    Quit date: 03/16/1988  . Smokeless tobacco: None  . Alcohol Use: No     Comment: Quit 1999, h/o DT's and szs. No ETOH since 1999  . Drug Use: No  . Sexual Activity: Not Asked   Other Topics Concern  . None   Social History Narrative   Review of Systems: Review of Systems  Constitutional: Negative for fever, chills and malaise/fatigue.  Respiratory: Negative for cough and shortness of breath.   Cardiovascular: Negative for chest pain.  Gastrointestinal: Negative for abdominal pain.  Musculoskeletal: Negative for myalgias.  Skin: Negative for rash.  Neurological: Negative for dizziness and headaches.  All other systems reviewed and are negative.    Objective:  Physical Exam: Filed Vitals:   05/22/15 1023  BP: 164/88  Pulse: 74  Temp: 98 F (36.7 C)  TempSrc: Oral  Weight: 197 lb 11.2 oz (89.676 kg)  SpO2: 99%  Physical Exam  Constitutional: He is well-developed, well-nourished, and in no distress.  Cardiovascular: Normal rate and regular rhythm.   Pulmonary/Chest: Effort normal and breath sounds normal. No respiratory distress. He has no wheezes. He has no rales.  Abdominal: Soft. Bowel sounds are normal. He exhibits no distension and no mass. There is no tenderness.  Musculoskeletal:  6cm solid oval mass in anterior left axilla appears fixed.   Skin:  Large surgical scar left posterior shoulder  Nursing note and vitals reviewed.   Assessment & Plan:  Case discussed with  And patient seen by Dr. 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  P:  Obtain CBC,  CMP, [facility] CXR, suspect will need CT of the chest Referral to General Surgery for biopsy.    Medications Ordered No orders of the defined types were placed in this encounter.   Other Orders Orders Placed This Encounter  Procedures  . DG Chest 2 View    Standing Status: Future     Number of Occurrences: 1     Standing Expiration Date: 07/19/2016    Order Specific Question:  Reason for Exam (SYMPTOM  OR DIAGNOSIS REQUIRED)    Answer:  6cm mass left axilla, Hx of Invasive Squamous Cell CA    Order Specific Question:  Preferred imaging location?    Answer:  [facility]  . CBC with Diff  . CMP14 + Anion Gap  . LDH  . Ambulatory referral to General Surgery    Referral Priority:  Routine    Referral Type:  Surgical    Referral Reason:  Specialty Services Required    Requested Specialty:  General Surgery    Number of Visits Requested:  1   Follow Up: Return a few days after biopsy with General Surgery.

## 2015-05-22 NOTE — Assessment & Plan Note (Signed)
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  P:  Obtain CBC, CMP, LDH CXR, suspect will need CT of the chest Referral to General Surgery for biopsy.

## 2015-05-23 LAB — CBC WITH DIFFERENTIAL/PLATELET
BASOS ABS: 0 10*3/uL (ref 0.0–0.2)
Basos: 0 %
EOS (ABSOLUTE): 0.3 10*3/uL (ref 0.0–0.4)
Eos: 5 %
Hematocrit: 38.9 % (ref 37.5–51.0)
Hemoglobin: 12.8 g/dL (ref 12.6–17.7)
IMMATURE GRANS (ABS): 0 10*3/uL (ref 0.0–0.1)
IMMATURE GRANULOCYTES: 0 %
Lymphocytes Absolute: 2.1 10*3/uL (ref 0.7–3.1)
Lymphs: 31 %
MCH: 30.2 pg (ref 26.6–33.0)
MCHC: 32.9 g/dL (ref 31.5–35.7)
MCV: 92 fL (ref 79–97)
MONOCYTES: 13 %
Monocytes Absolute: 0.9 10*3/uL (ref 0.1–0.9)
Neutrophils Absolute: 3.4 10*3/uL (ref 1.4–7.0)
Neutrophils: 51 %
PLATELETS: 256 10*3/uL (ref 150–379)
RBC: 4.24 x10E6/uL (ref 4.14–5.80)
RDW: 13.4 % (ref 12.3–15.4)
WBC: 6.7 10*3/uL (ref 3.4–10.8)

## 2015-05-23 LAB — CMP14 + ANION GAP
A/G RATIO: 1.3 (ref 1.1–2.5)
ALT: 11 IU/L (ref 0–44)
ANION GAP: 20 mmol/L — AB (ref 10.0–18.0)
AST: 16 IU/L (ref 0–40)
Albumin: 4.1 g/dL (ref 3.5–4.8)
Alkaline Phosphatase: 91 IU/L (ref 39–117)
BUN/Creatinine Ratio: 12 (ref 10–22)
BUN: 9 mg/dL (ref 8–27)
Bilirubin Total: 0.4 mg/dL (ref 0.0–1.2)
CALCIUM: 9.2 mg/dL (ref 8.6–10.2)
CO2: 20 mmol/L (ref 18–29)
CREATININE: 0.75 mg/dL — AB (ref 0.76–1.27)
Chloride: 102 mmol/L (ref 96–106)
GFR calc Af Amer: 102 mL/min/{1.73_m2} (ref >59)
GFR, EST NON AFRICAN AMERICAN: 88 mL/min/{1.73_m2} (ref >59)
GLUCOSE: 88 mg/dL (ref 65–99)
Globulin, Total: 3.1 g/dL (ref 1.5–4.5)
POTASSIUM: 4.2 mmol/L (ref 3.5–5.2)
Sodium: 142 mmol/L (ref 134–144)
Total Protein: 7.2 g/dL (ref 6.0–8.5)

## 2015-05-23 LAB — LACTATE DEHYDROGENASE: LDH: 251 IU/L — ABNORMAL HIGH (ref 121–224)

## 2015-06-02 ENCOUNTER — Ambulatory Visit: Payer: Self-pay | Admitting: Surgery

## 2015-06-04 ENCOUNTER — Other Ambulatory Visit: Payer: Self-pay | Admitting: Surgery

## 2015-06-04 DIAGNOSIS — N6320 Unspecified lump in the left breast, unspecified quadrant: Secondary | ICD-10-CM

## 2015-06-05 ENCOUNTER — Other Ambulatory Visit: Payer: Self-pay | Admitting: Surgery

## 2015-06-05 DIAGNOSIS — N63 Unspecified lump in unspecified breast: Secondary | ICD-10-CM

## 2015-06-11 ENCOUNTER — Ambulatory Visit
Admission: RE | Admit: 2015-06-11 | Discharge: 2015-06-11 | Disposition: A | Discharge status: Home / Self Care | Payer: Medicare Other | Source: Ambulatory Visit | Attending: Surgery | Admitting: Surgery

## 2015-06-11 ENCOUNTER — Other Ambulatory Visit: Payer: Self-pay | Admitting: Surgery

## 2015-06-11 DIAGNOSIS — N63 Unspecified lump in unspecified breast: Secondary | ICD-10-CM

## 2015-06-11 DIAGNOSIS — N6320 Unspecified lump in the left breast, unspecified quadrant: Secondary | ICD-10-CM

## 2015-06-19 ENCOUNTER — Other Ambulatory Visit: Payer: Self-pay | Admitting: Surgery

## 2015-06-19 ENCOUNTER — Ambulatory Visit
Admission: RE | Admit: 2015-06-19 | Discharge: 2015-06-19 | Disposition: A | Discharge status: Home / Self Care | Payer: Medicare Other | Source: Ambulatory Visit | Attending: Surgery | Admitting: Surgery

## 2015-06-19 DIAGNOSIS — N6320 Unspecified lump in the left breast, unspecified quadrant: Secondary | ICD-10-CM

## 2015-06-20 ENCOUNTER — Telehealth: Payer: Self-pay | Admitting: *Deleted

## 2015-06-20 ENCOUNTER — Telehealth: Payer: Self-pay | Admitting: Internal Medicine

## 2015-06-20 DIAGNOSIS — IMO0002 Reserved for concepts with insufficient information to code with codable children: Secondary | ICD-10-CM

## 2015-06-20 DIAGNOSIS — C799 Secondary malignant neoplasm of unspecified site: Secondary | ICD-10-CM

## 2015-06-20 NOTE — Telephone Encounter (Signed)
Called lynn at [facility], she had read the note from dr Heber Nerstrand, they will move forward with usual method

## 2015-06-20 NOTE — Telephone Encounter (Signed)
[facility] calls results: Left axillary node metastasis squamous cell Please call me asap

## 2015-06-20 NOTE — Addendum Note (Signed)
Addended by: Joni Reining C on: 06/20/2015 01:50 PM   Modules accepted: Orders

## 2015-06-20 NOTE — Telephone Encounter (Signed)
I called and spoke with Timothy Bradford and informed him of the results of the biopsy and the plan going forward: referral to Medical Oncology and Radiation Oncology.  He also gave me permission to speak with his wife [name] who I discussed the results and plan.

## 2015-06-20 NOTE — Assessment & Plan Note (Signed)
Biopsy results show metastatic squamous cell carcinoma.  I discussed this with Mr and Mrs Stettner.  Will go ahead and place referrals to medical oncology and radiation oncology.

## 2015-06-23 NOTE — Telephone Encounter (Signed)
Entered in error

## 2015-06-26 ENCOUNTER — Ambulatory Visit (HOSPITAL_BASED_OUTPATIENT_CLINIC_OR_DEPARTMENT_OTHER): Payer: Medicare Other | Admitting: Hematology and Oncology

## 2015-06-26 ENCOUNTER — Encounter: Payer: Self-pay | Admitting: Hematology and Oncology

## 2015-06-26 ENCOUNTER — Telehealth: Payer: Self-pay | Admitting: Hematology and Oncology

## 2015-06-26 VITALS — BP 156/70 | HR 73 | Temp 98.2°F | Resp 18 | Wt 188.6 lb

## 2015-06-26 DIAGNOSIS — F1021 Alcohol dependence, in remission: Secondary | ICD-10-CM | POA: Diagnosis not present

## 2015-06-26 DIAGNOSIS — C773 Secondary and unspecified malignant neoplasm of axilla and upper limb lymph nodes: Secondary | ICD-10-CM

## 2015-06-26 DIAGNOSIS — IMO0002 Reserved for concepts with insufficient information to code with codable children: Secondary | ICD-10-CM

## 2015-06-26 DIAGNOSIS — Z87891 Personal history of nicotine dependence: Secondary | ICD-10-CM | POA: Diagnosis not present

## 2015-06-26 DIAGNOSIS — Z86008 Personal history of in-situ neoplasm of other site: Secondary | ICD-10-CM

## 2015-06-26 DIAGNOSIS — C801 Malignant (primary) neoplasm, unspecified: Secondary | ICD-10-CM

## 2015-06-26 DIAGNOSIS — C799 Secondary malignant neoplasm of unspecified site: Secondary | ICD-10-CM

## 2015-06-26 NOTE — Progress Notes (Signed)
[facility] CONSULT NOTE  Patient Care Team: Riccardo Dubin, MD as PCP - General (Internal Medicine)  CHIEF COMPLAINTS/PURPOSE OF CONSULTATION:  Newly diagnosed squamous cell carcinoma left axilla  HISTORY OF PRESENTING ILLNESS:  Timothy Bradford 80 y.o. male is here because of recent diagnosis of squamous cell carcinoma the left axilla. The patient's wife noted that he was rubbing on a lump around December 2016. Apparently it started to grow in size and was starting to have pain and discomfort. This was then brought to the attention of his physician who then obtained a mammogram and ultrasound which revealed 4.1 x 5.3 x 4.2 cm lesion in the left axilla. There was no abnormalities in the breast. He underwent ultrasound-guided biopsy which came back as squamous cell carcinoma. Patient has had a long-standing history of tobacco and alcohol use both of which were stopped and years ago. 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 He has had skin surgery 2 years ago which came back as squamous cell carcinoma in situ. His wife has not noticed any change in weight or appetite. His major complaint is discomfort in the left axilla from the enlarged axillary lymph node. He also has extensive actinic keratosis throughout his body in fact most of his back is filled with dark actinic keratosis lesions.  I reviewed her records extensively and collaborated the history with the patient.  SUMMARY OF ONCOLOGIC HISTORY:   Metastatic squamous cell carcinoma ([place])   06/11/2015 Imaging Mammogram: No suspicious masses in the breast; in the left axilla large fixed mass was palpable measuring 4.1 x 5.3 x 4.2 cm   06/19/2015 Initial Diagnosis Metastatic squamous cell carcinoma in left axillary mass   MEDICAL HISTORY:  Past Medical History  Diagnosis Date  . Hypertension   . Hyperlipidemia   .  Osteoarthritis   . Erectile dysfunction   . Chronic venous insufficiency   . CHF [id number]    Systolic failure. Cardiac cath in 2004: The LV showed mild global hypokinesia. EF of 45-50%. LVEDP was 12 mmHg. Left main was long which was patent. LAD has 10% distal stenosis. The vessel proximally was very tortuous. Diagonal one was small which was patent. Left circumflex was patent proximally and in mid portion and then tapers down in AV groove after giving off OM-2. OM-1 was large which was p  . Seborrheic keratoses     Multiple. Sees Lyndle Herrlich  . Squamous cell cancer of skin of left shoulder Sept 2014    [facility] Harvel Quale with clear margins    SURGICAL HISTORY: Past Surgical History  Procedure Laterality Date  . Hernia repair      SOCIAL HISTORY: Social History   Social History  . Marital Status: Married    Spouse Name: N/A  . Number of Children: N/A  . Years of Education: N/A   Occupational History  . Not on file.   Social History Main Topics  . Smoking status: Former Smoker -- 1.50 packs/day for 4.5 years    Types: Cigarettes    Quit date: 03/16/1988  . Smokeless tobacco: Not on file  . Alcohol Use: No     Comment: Quit 1999, h/o DT's and szs. No ETOH since 1999  . Drug Use: No  . Sexual Activity: Not on file   Other Topics Concern  . Not on file   Social  History Narrative    FAMILY HISTORY: No family history on file.  ALLERGIES:  is allergic to ace inhibitors and fosinopril sodium.  MEDICATIONS:  Current Outpatient Prescriptions  Medication Sig Dispense Refill  . aspirin EC 81 MG tablet Take 81 mg by mouth daily.    . metoprolol tartrate (LOPRESSOR) 25 MG tablet Take 1 tablet (25 mg total) by mouth 2 (two) times daily. 180 tablet 3  . simvastatin (ZOCOR) 40 MG tablet TAKE 1 TABLET BY MOUTH AT BEDTIME 90 tablet 3  . [DISCONTINUED] potassium chloride (KLOR-CON) 20 MEQ packet Take 1 tablet by mouth twice a day. 90 tablet 3   No current  facility-administered medications for this visit.    REVIEW OF SYSTEMS:   Constitutional: Denies fevers, chills or abnormal night sweats Eyes: Denies blurriness of vision, double vision or watery eyes Ears, nose, mouth, throat, and face: Denies mucositis or sore throat Respiratory: Denies cough, dyspnea or wheezes Cardiovascular: Denies palpitation, chest discomfort or lower extremity swelling Gastrointestinal:  Denies nausea, heartburn or change in bowel habits Skin: Extensive involvement with actinic keratosis towards his back surgical scar on his left shoulder Lymphatics: Enlarged and painful left axillary lymph node Neurological:Denies numbness, tingling or new weaknesses Behavioral/Psych: Mood is stable, no new changes   All other systems were reviewed with the patient and are negative.  PHYSICAL EXAMINATION: ECOG PERFORMANCE STATUS: 1 - Symptomatic but completely ambulatory  Filed Vitals:   06/26/15 1241  BP: 156/70  Pulse: 73  Temp: 98.2 F (36.8 C)  Resp: 18   Filed Weights   06/26/15 1241  Weight: 188 lb 9.6 oz (85.548 kg)    GENERAL:alert, no distress and comfortable SKIN: skin color, texture, turgor are normal, no rashes or significant lesions EYES: normal, conjunctiva are pink and non-injected, sclera clear OROPHARYNX:no exudate, no erythema and lips, buccal mucosa, and tongue normal  NECK: supple, thyroid normal size, non-tender, without nodularity LYMPH:  Enlarged left axillary lymph node at least 6-7 cm in size LUNGS: clear to auscultation and percussion with normal breathing effort HEART: regular rate & rhythm and no murmurs and no lower extremity edema ABDOMEN:abdomen soft, non-tender and normal bowel sounds Musculoskeletal:no cyanosis of digits and no clubbing  PSYCH: alert & oriented x 3 with fluent speech NEURO: no focal motor/sensory deficits  LABORATORY DATA:  I have reviewed the data as listed Lab Results  Component Value Date   WBC 6.7  05/22/2015   HGB 12.1* 07/23/2013   HCT 38.9 05/22/2015   MCV 92 05/22/2015   PLT 256 05/22/2015   Lab Results  Component Value Date   NA 142 05/22/2015   K 4.2 05/22/2015   CL 102 05/22/2015   CO2 20 05/22/2015    RADIOGRAPHIC STUDIES: I have personally reviewed the radiological reports and agreed with the findings in the report.  ASSESSMENT AND PLAN:  Metastatic squamous cell carcinoma ([place]) Mammogram 06/11/15: No suspicious masses in the breast; in the left axilla large fixed mass was palpable measuring 4.1 x 5.3 x 4.2 cm Axillary lymph node biopsy 06/19/2015: Metastatic squamous cell carcinoma in left axillary mass   Carcinoma of unknown primary: 1. We will obtain a PET CT scan for staging purposes and to see if he can identify the primary. 2. I would like to send the tissue for carcinoma unknown primary molecular testing to determine where it came from. 3. If all scans are negative then I would request Dr. Brantley Stage for axillary lymph node dissection followed by adjuvant radiation. I  do not think there would be any role for systemic chemotherapy if that is the only site of disease.  There is no role of tumor markers since this is squamous cell carcinoma. Return to clinic after scans and diagnostic testing is complete.  All questions were answered. The patient knows to call the clinic with any problems, questions or concerns.    Rulon Eisenmenger, MD 06/26/2015

## 2015-06-26 NOTE — Progress Notes (Signed)
Unable to get in to exam room prior to MD.  No assessment performed.  

## 2015-06-26 NOTE — Telephone Encounter (Signed)
Patient scheduled to see Dr. Lindi Adie 03/23 @ 1.  Referring Dr. Erroll Luna Dx- Mets squamous cell carinoma.

## 2015-06-26 NOTE — Assessment & Plan Note (Addendum)
Mammogram 06/11/15: No suspicious masses in the breast; in the left axilla large fixed mass was palpable measuring 4.1 x 5.3 x 4.2 cm Axillary lymph node biopsy 06/19/2015: Metastatic squamous cell carcinoma in left axillary mass   Carcinoma of unknown primary: 1. We will obtain a PET CT scan for staging purposes and to see if he can identify the primary. 2. I would like to send the tissue for carcinoma unknown primary molecular testing to determine where it came from. 3. If all scans are negative then I would request Dr. Brantley Stage for axillary lymph node dissection followed by adjuvant radiation. I do not think there would be any role for systemic chemotherapy if that is the only site of disease.  There is no role of tumor marker since this is squamous cell carcinoma. Return to clinic after scans and diagnostic testing is complete.

## 2015-06-27 ENCOUNTER — Telehealth: Payer: Self-pay | Admitting: Hematology and Oncology

## 2015-06-27 NOTE — Telephone Encounter (Signed)
s.w pt wife and advised on April 6 appt....ok and aware

## 2015-07-04 ENCOUNTER — Telehealth: Payer: Self-pay

## 2015-07-04 NOTE — Telephone Encounter (Signed)
Pt spouse [name] called requesting advice on OTC medication for seasonal allergies, plus something for pain.  Wanted to know the option best for patient between tylenol or advil.  His left axilla lump is reported to be larger in size and beginning to be painful.  Spouse stating patient doesn't complain so unsure if today is 1st day it's bothering him or not but he was guarding the area and when she asked he said it was "starting to bother him."  I told her I believed both tylenol or advil to be OK but would ask PCP/attending for more advice, in addition to request for OTC for allergies.  (Sneezing and scratchy throat, most noticed after being outside)

## 2015-07-04 NOTE — Telephone Encounter (Signed)
Spoke with patient and wife, they understand instruction

## 2015-07-04 NOTE — Telephone Encounter (Signed)
Either would be acceptable but if he takes advil, he needs to take it with food to avoid GI upset.   Allergies - If he prefers pills could take claritin / loratidine or zrytec or allegra. If prefers nose spray, there are OTC steroid nose sprays like flonase. Nose sprays are more effective if sxs bad.

## 2015-07-07 NOTE — Progress Notes (Addendum)
Diagnosis 06-19-15 Metastatic squamous cell carcinoma in left axillary Histology per Pathology Report:  06-19-15 Lymph node, needle/core biopsy, left axillary mass - METASTATIC SQUAMOUS CELL CARCINOMA  Presented to physician with a painful  enlarging mass in right axilla ing December 2016  Past/Anticipated interventions by surgeon:06-19-15 Biopsy Dr. Erroll Luna, If all scans are negative request Dr. Erroll Luna for axillary lymph node dissection   Past/Anticipated interventions by oncology: Dr. Erroll Luna Lymph node, needle/core biopsy, left axillary mass, PET for staging 07-08-15 Adjuvant radiation Return to clinic after scans and diagnostic testing is complete.  Lymphedema issues, if any:No Pain issues,if any:No Arm movement: 4SAFETY ISSUES:  Prior radiation? :No  Pacemaker/ICD? No  Possible current pregnancy?No  Is the patient on methotrexate?:No  Current Complaints / other details: Here to discuss radiation treatment skin surgery 2 years ago which came back as squamous cell carcinoma in situ 40-pack-year smoking and heavy alcohol use quit 1999 Smoked 44 years  Denies any breathing problems Disabled BP 140/70 mmHg  Pulse 72  Temp(Src) 98 F (36.7 C) (Oral)  Resp 18  Ht 6\' 2"  (1.88 m)  Wt 187 lb (84.823 kg)  BMI 24.00 kg/m2  SpO2 100%

## 2015-07-08 ENCOUNTER — Encounter (HOSPITAL_COMMUNITY)
Admission: RE | Admit: 2015-07-08 | Discharge: 2015-07-08 | Disposition: A | Discharge status: Home / Self Care | Payer: Medicare Other | Source: Ambulatory Visit | Attending: Hematology and Oncology | Admitting: Hematology and Oncology

## 2015-07-08 DIAGNOSIS — C799 Secondary malignant neoplasm of unspecified site: Secondary | ICD-10-CM | POA: Diagnosis present

## 2015-07-08 DIAGNOSIS — IMO0002 Reserved for concepts with insufficient information to code with codable children: Secondary | ICD-10-CM

## 2015-07-08 DIAGNOSIS — C801 Malignant (primary) neoplasm, unspecified: Secondary | ICD-10-CM | POA: Insufficient documentation

## 2015-07-08 LAB — GLUCOSE, CAPILLARY: GLUCOSE-CAPILLARY: 93 mg/dL (ref 65–99)

## 2015-07-08 MED ORDER — FLUDEOXYGLUCOSE F - 18 (FDG) INJECTION
9.3500 | Freq: Once | INTRAVENOUS | Status: AC | PRN
  Administered 2015-07-08: 9.35 via INTRAVENOUS

## 2015-07-09 ENCOUNTER — Encounter (HOSPITAL_COMMUNITY): Payer: Self-pay

## 2015-07-10 ENCOUNTER — Encounter: Payer: Self-pay | Admitting: Hematology and Oncology

## 2015-07-10 ENCOUNTER — Ambulatory Visit: Payer: Self-pay | Admitting: Surgery

## 2015-07-10 ENCOUNTER — Ambulatory Visit
Admission: RE | Admit: 2015-07-10 | Discharge: 2015-07-10 | Disposition: A | Discharge status: Home / Self Care | Payer: Medicare Other | Source: Ambulatory Visit | Attending: Radiation Oncology | Admitting: Radiation Oncology

## 2015-07-10 ENCOUNTER — Ambulatory Visit (HOSPITAL_BASED_OUTPATIENT_CLINIC_OR_DEPARTMENT_OTHER): Payer: Medicare Other | Admitting: Hematology and Oncology

## 2015-07-10 ENCOUNTER — Telehealth: Payer: Self-pay | Admitting: Hematology and Oncology

## 2015-07-10 ENCOUNTER — Encounter: Payer: Self-pay | Admitting: Radiation Oncology

## 2015-07-10 VITALS — BP 140/70 | HR 72 | Temp 98.0°F | Resp 18 | Ht 74.0 in | Wt 187.0 lb

## 2015-07-10 VITALS — BP 159/75 | HR 92 | Temp 98.2°F | Resp 18 | Ht 74.0 in | Wt 186.6 lb

## 2015-07-10 DIAGNOSIS — C773 Secondary and unspecified malignant neoplasm of axilla and upper limb lymph nodes: Secondary | ICD-10-CM

## 2015-07-10 DIAGNOSIS — IMO0002 Reserved for concepts with insufficient information to code with codable children: Secondary | ICD-10-CM

## 2015-07-10 DIAGNOSIS — D0462 Carcinoma in situ of skin of left upper limb, including shoulder: Secondary | ICD-10-CM | POA: Insufficient documentation

## 2015-07-10 DIAGNOSIS — C801 Malignant (primary) neoplasm, unspecified: Secondary | ICD-10-CM

## 2015-07-10 DIAGNOSIS — Z510 Encounter for antineoplastic radiation therapy: Secondary | ICD-10-CM | POA: Diagnosis not present

## 2015-07-10 DIAGNOSIS — C799 Secondary malignant neoplasm of unspecified site: Secondary | ICD-10-CM

## 2015-07-10 NOTE — Progress Notes (Addendum)
Radiation Oncology         [phone]) [phone] ________________________________  Initial Outpatient Consultation - Date: 07/10/2015   Name: Timothy Bradford MRN: [id number]   DOB: 02-23-1936  REFERRING PHYSICIAN: Lucious Groves, DO  DIAGNOSIS AND Bradford: Metastatic squamous cell carcinoma in left axillary.  HISTORY OF PRESENT ILLNESS::Timothy Bradford is a 80 y.o. male who presented to his physician with a painful enlarging mass in left axilla in February 2017. He noticed the nodule in his left armpit for about 2 months prior to going to his physician. He had a lymph node biopsy of the left axillary mass 06/19/2015, revealing metastatic squamous cell carcinoma. He had a PET scan 07/08/2015, revealing a large hypermetabolic mass identified within the left axilla, no evidence for distant metastatic disease. He is accompanied today by his wife and daughter. He does not complain of weight loss, throat pain, or dysphagia.  He had skin cancer removed in 2014 from his left shoulder. The pathology report revealed squamous cell carcinoma in situ with the lesion extending to the base and lateral edges of the biopsy. Per his primary care physician's notes, he discussed this pathology report with the skin center and was told this was invasive squamous cell carcinoma removed with clear margins.  PREVIOUS RADIATION THERAPY: No  Past medical, social and family history were reviewed in the electronic chart. Review of symptoms was reviewed in the electronic chart. Medications were reviewed in the electronic chart.   PHYSICAL EXAM:  Filed Vitals:   07/10/15 0917  BP: 140/70  Pulse: 72  Temp: 98 F (36.7 C)  Resp: 18  .187 lb (84.823 kg). He has a large palpable axillary lymph node that is mobile and well healed. He has a surgical incision over his superior left shoulder with some hyperpigmentation and no signs of tumor recurrence.  IMPRESSION: Timothy Bradford is a 80 yo male with metastatic squamous cell carcinoma in the left  axillary. He is scheduled for ENT consultation in May and is scheduled to be presented in ENT tumor conference on 07/23/2015 with follow up with Dr. Lindi Adie on 07/24/2015.  PLAN: I have recommended axillary node dissection followed by radiation.  In light of a previous SCCa of the left shoulder, this is the likely source of his metastases.   Dr. Lindi Adie has ordered an MRI of the liver with contrast because of the cysts found in his liver on the recent PET scan.  He will have an appointment scheduled with Dr. Brantley Bradford to discuss an axillary lymph node dissection. We discussed 6 weeks of adjuvant treatment as an outpatient and the side effects, including skin changes and lymphedema. I suggested referal to physical therapy pre op or post op for discussion of lymphedema prevention as well as arm mobility. I will plan on seeing him back a month after his surgery to begin radiation planning.  We will call his wife, Timothy Bradford, with the plan. Her phone number is [phone].    We also asked for a signed release of slides of skin surgery from skin center.  I spent 40 minutes  face to face with the patient and more than 50% of that time was spent in counseling and/or coordination of care.  ------------------------------------------------  Timothy Silversmith, MD    This document serves as a record of services personally performed by Timothy Silversmith, MD. It was created on her behalf by  Timothy Bradford, a trained medical scribe. The creation of this record is based on the scribe's personal observations  and the provider's statements to them. This document has been checked and approved by the attending provider.

## 2015-07-10 NOTE — Assessment & Plan Note (Signed)
Mammogram 06/11/15: No suspicious masses in the breast; in the left axilla large fixed mass was palpable measuring 4.1 x 5.3 x 4.2 cm Axillary lymph node biopsy 06/19/2015: Metastatic squamous cell carcinoma in left axillary mass  PET-CT 07/08/15: Large hypermetabolic mass in the left axilla measuring 7.4 cm with SUV 32, several smaller lymph nodes surrounding this. Emphysema, liver cysts, indeterminate structure in the left lobe of the liver without significant uptake.  Plan: 1. ENT referral for scopes 2. Presentation to head and neck tumor board 3. Liver MRI for evaluation of the liver lesion 4. Plan of care as determined by the tumor board.

## 2015-07-10 NOTE — Telephone Encounter (Signed)
appt made and avs printed. ENT appt sch for 5/2 at 9 am with Dr. Erik Obey. MRI to be sch by central radiology

## 2015-07-10 NOTE — Progress Notes (Signed)
Patient Care Team: Riccardo Dubin, MD as PCP - General (Internal Medicine)  DIAGNOSIS: squamous cell carcinoma axilla  SUMMARY OF ONCOLOGIC HISTORY:   Metastatic squamous cell carcinoma ([place])   06/11/2015 Imaging Mammogram: No suspicious masses in the breast; in the left axilla large fixed mass was palpable measuring 4.1 x 5.3 x 4.2 cm   06/19/2015 Initial Diagnosis Metastatic squamous cell carcinoma in left axillary mass    CHIEF COMPLIANT: follow-up to review scans  INTERVAL HISTORY: Timothy Bradford is a 80 year old with above-mentioned history of found large axillary mass biopsy-proven to be metastatic squamous cell carcinoma. He underwent PET CT scan as well as molecular testing to evaluate this further. He is here today accompanied by his family to discuss the PET scan results. He continues to having intermittent pain for which she is using Tylenol. It appears that the Tylenol is working and he does not need any additional pain medication.  REVIEW OF SYSTEMS:   Constitutional: Denies fevers, chills or abnormal weight loss Eyes: Denies blurriness of vision Ears, nose, mouth, throat, and face: Denies mucositis or sore throat Respiratory: Denies cough, dyspnea or wheezes Cardiovascular: Denies palpitation, chest discomfort Gastrointestinal:  Denies nausea, heartburn or change in bowel habits Skin: Denies abnormal skin rashes Lymphatics: markedly enlarged axillary lymph node in the left axilla Neurological:Denies numbness, tingling or new weaknesses Behavioral/Psych: Mood is stable, no new changes  Extremities: No lower extremity edema  All other systems were reviewed with the patient and are negative.  I have reviewed the past medical history, past surgical history, social history and family history with the patient and they are unchanged from previous note.  ALLERGIES:  is allergic to ace inhibitors and fosinopril sodium.  MEDICATIONS:  Current Outpatient Prescriptions    Medication Sig Dispense Refill  . aspirin EC 81 MG tablet Take 81 mg by mouth daily.    . metoprolol tartrate (LOPRESSOR) 25 MG tablet Take 1 tablet (25 mg total) by mouth 2 (two) times daily. 180 tablet 3  . simvastatin (ZOCOR) 40 MG tablet TAKE 1 TABLET BY MOUTH AT BEDTIME 90 tablet 3  . [DISCONTINUED] potassium chloride (KLOR-CON) 20 MEQ packet Take 1 tablet by mouth twice a day. 90 tablet 3   No current facility-administered medications for this visit.    PHYSICAL EXAMINATION: ECOG PERFORMANCE STATUS: 1 - Symptomatic but completely ambulatory  Filed Vitals:   07/10/15 0806  BP: 159/75  Pulse: 92  Temp: 98.2 F (36.8 C)  Resp: 18   Filed Weights   07/10/15 0806  Weight: 186 lb 9.6 oz (84.641 kg)    GENERAL:alert, no distress and comfortable SKIN: skin color, texture, turgor are normal, no rashes or significant lesions EYES: normal, Conjunctiva are pink and non-injected, sclera clear OROPHARYNX:no exudate, no erythema and lips, buccal mucosa, and tongue normal  NECK: supple, thyroid normal size, non-tender, without nodularity LYMPH:  Markedly enlarged left axillary lymph node about the size of my fist LUNGS: clear to auscultation and percussion with normal breathing effort HEART: regular rate & rhythm and no murmurs and no lower extremity edema ABDOMEN:abdomen soft, non-tender and normal bowel sounds MUSCULOSKELETAL:no cyanosis of digits and no clubbing  NEURO: alert & oriented x 3 with fluent speech, no focal motor/sensory deficits EXTREMITIES: No lower extremity edema  LABORATORY DATA:  I have reviewed the data as listed   Chemistry      Component Value Date/Time   NA 142 05/22/2015 1129   NA 141 09/19/2014 0954   K 4.2  05/22/2015 1129   CL 102 05/22/2015 1129   CO2 20 05/22/2015 1129   BUN 9 05/22/2015 1129   BUN 13 09/19/2014 0954   CREATININE 0.75* 05/22/2015 1129   CREATININE 1.00 09/19/2014 0954      Component Value Date/Time   CALCIUM 9.2 05/22/2015  1129   ALKPHOS 91 05/22/2015 1129   AST 16 05/22/2015 1129   ALT 11 05/22/2015 1129   BILITOT 0.4 05/22/2015 1129   BILITOT 0.7 09/19/2014 0954       Lab Results  Component Value Date   WBC 6.7 05/22/2015   HGB 12.1* 07/23/2013   HCT 38.9 05/22/2015   MCV 92 05/22/2015   PLT 256 05/22/2015   NEUTROABS 3.4 05/22/2015   ASSESSMENT & PLAN:  Metastatic squamous cell carcinoma ([facility]) Mammogram 06/11/15: No suspicious masses in the breast; in the left axilla large fixed mass was palpable measuring 4.1 x 5.3 x 4.2 cm Axillary lymph node biopsy 06/19/2015: Metastatic squamous cell carcinoma in left axillary mass  PET-CT 07/08/15: Large hypermetabolic mass in the left axilla measuring 7.4 cm with SUV 32, several smaller lymph nodes surrounding this. Emphysema, liver cysts, indeterminate structure in the left lobe of the liver without significant uptake.  Plan: 1. ENT referral for scopes 2. Presentation to head and neck tumor board 3. Liver MRI for evaluation of the liver lesion 4. Plan of care as determined by the tumor board.   I discussed with Dr. Pablo Ledger different options of treatment. One potential option could be to resect the tumor and do adjuvant radiation. I might consider sending the final pathology specimen for [facility] One testing. Systemic therapy may be a consideration in the future for any relapses.   Orders Placed This Encounter  Procedures  . MR Liver W Contrast    Standing Status: Future     Number of Occurrences:      Standing Expiration Date: 07/09/2016    Order Specific Question:  If indicated for the ordered procedure, I authorize the administration of contrast media per Radiology protocol    Answer:  Yes    Order Specific Question:  Reason for Exam (SYMPTOM  OR DIAGNOSIS REQUIRED)    Answer:  PEt Ct showing liver abnormality with squamous cell cancer    Order Specific Question:  Preferred imaging location?    Answer:  [facility] (table limit-350  lbs)    Order Specific Question:  What is the patient's sedation requirement?    Answer:  No Sedation    Order Specific Question:  Does the patient have a pacemaker or implanted devices?    Answer:  No  . Ambulatory referral to ENT    Referral Priority:  Routine    Referral Type:  Consultation    Referral Reason:  Specialty Services Required    Referred to Provider:  Jodi Marble, MD    Requested Specialty:  Otolaryngology    Number of Visits Requested:  1   The patient has a good understanding of the overall plan. he agrees with it. he will call with any problems that may develop before the next visit here.   Rulon Eisenmenger, MD 07/10/2015

## 2015-07-10 NOTE — Addendum Note (Signed)
Encounter addended by: Malena Edman, RN on: 07/10/2015 11:37 AM<BR>     Documentation filed: Charges VN

## 2015-07-10 NOTE — Progress Notes (Signed)
Unable to get in to exam room prior to MD.  No assessment performed.  

## 2015-07-11 ENCOUNTER — Encounter: Payer: Medicare Other | Admitting: Internal Medicine

## 2015-07-14 ENCOUNTER — Telehealth: Payer: Self-pay | Admitting: Hematology and Oncology

## 2015-07-14 NOTE — Telephone Encounter (Signed)
pt called to confirm appt.... done...pt ok and aware °

## 2015-07-15 ENCOUNTER — Telehealth: Payer: Self-pay | Admitting: *Deleted

## 2015-07-15 NOTE — Telephone Encounter (Signed)
  Oncology Nurse Navigator Documentation  Navigator Location: [facility] (07/15/15 1600) Navigator Encounter Type: Telephone (07/15/15 1600) Telephone: Lahoma Crocker Call;Appt Confirmation/Clarification (07/15/15 1600)     In follow-up to yesterday's call to [facility] and request for appt with Dr. Erik Obey before the currently scheduled 5/2 0930, spoke with Kalman Shan at [facility] ENT who confirmed [name] has an appt to see Dr. Erik Obey on [id number] at [id number].  I notified Dr. Sonny Dandy.  Gayleen Orem, RN, BSN, [place] at [place] [phone]                                       Time Spent with Patient: 15 (07/15/15 1600)

## 2015-07-21 ENCOUNTER — Ambulatory Visit (HOSPITAL_COMMUNITY)
Admission: RE | Admit: 2015-07-21 | Discharge: 2015-07-21 | Disposition: A | Discharge status: Home / Self Care | Payer: Medicare Other | Source: Ambulatory Visit | Attending: Hematology and Oncology | Admitting: Hematology and Oncology

## 2015-07-21 DIAGNOSIS — D1803 Hemangioma of intra-abdominal structures: Secondary | ICD-10-CM | POA: Insufficient documentation

## 2015-07-21 DIAGNOSIS — C801 Malignant (primary) neoplasm, unspecified: Secondary | ICD-10-CM | POA: Diagnosis present

## 2015-07-21 DIAGNOSIS — C799 Secondary malignant neoplasm of unspecified site: Secondary | ICD-10-CM | POA: Diagnosis not present

## 2015-07-21 DIAGNOSIS — IMO0002 Reserved for concepts with insufficient information to code with codable children: Secondary | ICD-10-CM

## 2015-07-21 DIAGNOSIS — N2889 Other specified disorders of kidney and ureter: Secondary | ICD-10-CM | POA: Insufficient documentation

## 2015-07-21 LAB — CREATININE, SERUM
CREATININE: 0.89 mg/dL (ref 0.61–1.24)
GFR calc Af Amer: >60 mL/min (ref >60)

## 2015-07-21 MED ORDER — GADOBENATE DIMEGLUMINE 529 MG/ML IV SOLN
20.0000 mL | Freq: Once | INTRAVENOUS | Status: AC | PRN
  Administered 2015-07-21: 17 mL via INTRAVENOUS

## 2015-07-24 ENCOUNTER — Encounter: Payer: Self-pay | Admitting: Hematology and Oncology

## 2015-07-24 ENCOUNTER — Telehealth: Payer: Self-pay | Admitting: Hematology and Oncology

## 2015-07-24 ENCOUNTER — Ambulatory Visit (HOSPITAL_BASED_OUTPATIENT_CLINIC_OR_DEPARTMENT_OTHER): Payer: Medicare Other | Admitting: Hematology and Oncology

## 2015-07-24 VITALS — BP 137/69 | HR 90 | Temp 98.1°F | Resp 18 | Wt 185.3 lb

## 2015-07-24 DIAGNOSIS — C801 Malignant (primary) neoplasm, unspecified: Secondary | ICD-10-CM

## 2015-07-24 DIAGNOSIS — IMO0002 Reserved for concepts with insufficient information to code with codable children: Secondary | ICD-10-CM

## 2015-07-24 DIAGNOSIS — C773 Secondary and unspecified malignant neoplasm of axilla and upper limb lymph nodes: Secondary | ICD-10-CM | POA: Diagnosis not present

## 2015-07-24 DIAGNOSIS — C799 Secondary malignant neoplasm of unspecified site: Secondary | ICD-10-CM

## 2015-07-24 NOTE — Telephone Encounter (Signed)
appt made and avs printed °

## 2015-07-24 NOTE — Progress Notes (Signed)
Patient Care Team: Riccardo Dubin, MD as PCP - General (Internal Medicine)  SUMMARY OF ONCOLOGIC HISTORY:   Metastatic squamous cell carcinoma ([facility])   06/11/2015 Imaging Mammogram: No suspicious masses in the breast; in the left axilla large fixed mass was palpable measuring 4.1 x 5.3 x 4.2 cm   06/19/2015 Initial Diagnosis Metastatic squamous cell carcinoma in left axillary mass    CHIEF COMPLIANT: Follow-up to discuss the tumor board recommendation  INTERVAL HISTORY: Timothy Bradford is a 80 year old with above-mentioned history of squamous cell carcinoma involving his left axilla without any clear evidence of metastatic disease anywhere in his body. He was presented at the head and neck tumor board after undergoing ENT evaluation. No findings were identified in his head and neck area. He had a liver MRI which was normal.  REVIEW OF SYSTEMS:   Constitutional: Denies fevers, chills or abnormal weight loss Eyes: Denies blurriness of vision Ears, nose, mouth, throat, and face: Denies mucositis or sore throat Respiratory: Denies cough, dyspnea or wheezes Cardiovascular: Denies palpitation, chest discomfort Gastrointestinal:  Denies nausea, heartburn or change in bowel habits Skin: Denies abnormal skin rashes Lymphatics: Enlarged left axillary lymphatic mass Neurological:Denies numbness, tingling or new weaknesses Behavioral/Psych: Mood is stable, no new changes  Extremities: No lower extremity edema  All other systems were reviewed with the patient and are negative.  I have reviewed the past medical history, past surgical history, social history and family history with the patient and they are unchanged from previous note.  ALLERGIES:  is allergic to ace inhibitors and fosinopril sodium.  MEDICATIONS:  Current Outpatient Prescriptions  Medication Sig Dispense Refill  . acetaminophen (TYLENOL) 500 MG tablet Take 500 mg by mouth every 8 (eight) hours as needed. Two tablets    . aspirin  EC 81 MG tablet Take 81 mg by mouth daily.    . metoprolol tartrate (LOPRESSOR) 25 MG tablet Take 1 tablet (25 mg total) by mouth 2 (two) times daily. 180 tablet 3  . simvastatin (ZOCOR) 40 MG tablet TAKE 1 TABLET BY MOUTH AT BEDTIME 90 tablet 3  . [DISCONTINUED] potassium chloride (KLOR-CON) 20 MEQ packet Take 1 tablet by mouth twice a day. 90 tablet 3   No current facility-administered medications for this visit.    PHYSICAL EXAMINATION: ECOG PERFORMANCE STATUS: 1 - Symptomatic but completely ambulatory  Filed Vitals:   07/24/15 0815  BP: 137/69  Pulse: 90  Temp: 98.1 F (36.7 C)  Resp: 18   Filed Weights   07/24/15 0815  Weight: 185 lb 4.8 oz (84.052 kg)    GENERAL:alert, no distress and comfortable SKIN: skin color, texture, turgor are normal, no rashes or significant lesions EYES: normal, Conjunctiva are pink and non-injected, sclera clear OROPHARYNX:no exudate, no erythema and lips, buccal mucosa, and tongue normal  NECK: supple, thyroid normal size, non-tender, without nodularity LYMPH:  no palpable lymphadenopathy in the cervical, axillary or inguinal LUNGS: clear to auscultation and percussion with normal breathing effort HEART: regular rate & rhythm and no murmurs and no lower extremity edema ABDOMEN:abdomen soft, non-tender and normal bowel sounds MUSCULOSKELETAL:no cyanosis of digits and no clubbing  NEURO: alert & oriented x 3 with fluent speech, no focal motor/sensory deficits EXTREMITIES: No lower extremity edema  LABORATORY DATA:  I have reviewed the data as listed   Chemistry      Component Value Date/Time   NA 142 05/22/2015 1129   NA 141 09/19/2014 0954   K 4.2 05/22/2015 1129   CL 102  05/22/2015 1129   CO2 20 05/22/2015 1129   BUN 9 05/22/2015 1129   BUN 13 09/19/2014 0954   CREATININE 0.89 07/21/2015 0800   CREATININE 1.00 09/19/2014 0954      Component Value Date/Time   CALCIUM 9.2 05/22/2015 1129   ALKPHOS 91 05/22/2015 1129   AST 16  05/22/2015 1129   ALT 11 05/22/2015 1129   BILITOT 0.4 05/22/2015 1129   BILITOT 0.7 09/19/2014 0954       Lab Results  Component Value Date   WBC 6.7 05/22/2015   HGB 12.1* 07/23/2013   HCT 38.9 05/22/2015   MCV 92 05/22/2015   PLT 256 05/22/2015   NEUTROABS 3.4 05/22/2015   ASSESSMENT & PLAN:  Metastatic squamous cell carcinoma (HCC) Mammogram 06/11/15: No suspicious masses in the breast; in the left axilla large fixed mass was palpable measuring 4.1 x 5.3 x 4.2 cm Axillary lymph node biopsy 06/19/2015: Metastatic squamous cell carcinoma in left axillary mass  PET-CT 07/08/15: Large hypermetabolic mass in the left axilla measuring 7.4 cm with SUV 32, several smaller lymph nodes surrounding this. Emphysema, liver cysts, indeterminate structure in the left lobe of the liver without significant uptake. Liver MRI: 07/21/2015 5.2 cm benign cavernous hemangioma  Plan: 1. ENT to scope him 2. Surgical excision of the tumor scheduled for 08/12/2015 3. Followed by radiation therapy No plan to do systemic chemotherapy.  Return to clinic after surgery on 08/19/2015   No orders of the defined types were placed in this encounter.   The patient has a good understanding of the overall plan. he agrees with it. he will call with any problems that may develop before the next visit here.   Rulon Eisenmenger, MD 07/24/2015

## 2015-07-24 NOTE — Assessment & Plan Note (Signed)
Mammogram 06/11/15: No suspicious masses in the breast; in the left axilla large fixed mass was palpable measuring 4.1 x 5.3 x 4.2 cm Axillary lymph node biopsy 06/19/2015: Metastatic squamous cell carcinoma in left axillary mass  PET-CT 07/08/15: Large hypermetabolic mass in the left axilla measuring 7.4 cm with SUV 32, several smaller lymph nodes surrounding this. Emphysema, liver cysts, indeterminate structure in the left lobe of the liver without significant uptake. Liver MRI: 07/21/2015 5.2 cm benign cavernous hemangioma  Plan: 1. ENT to scope him 2. Surgical excision of the tumor 3. Followed by radiation therapy No plan to do systemic chemotherapy.  Return to clinic after surgery

## 2015-07-28 ENCOUNTER — Telehealth: Payer: Self-pay | Admitting: Hematology and Oncology

## 2015-07-28 NOTE — Telephone Encounter (Signed)
Faxed pt medical records to Dr. Erik Obey [id number]

## 2015-08-04 ENCOUNTER — Ambulatory Visit: Payer: Self-pay | Admitting: Surgery

## 2015-08-04 ENCOUNTER — Encounter (HOSPITAL_COMMUNITY)
Admission: RE | Admit: 2015-08-04 | Discharge: 2015-08-04 | Disposition: A | Discharge status: Home / Self Care | Payer: Medicare Other | Source: Ambulatory Visit | Attending: Surgery | Admitting: Surgery

## 2015-08-04 ENCOUNTER — Encounter (HOSPITAL_COMMUNITY): Payer: Self-pay

## 2015-08-04 DIAGNOSIS — C4492 Squamous cell carcinoma of skin, unspecified: Secondary | ICD-10-CM | POA: Insufficient documentation

## 2015-08-04 DIAGNOSIS — Z79899 Other long term (current) drug therapy: Secondary | ICD-10-CM | POA: Diagnosis not present

## 2015-08-04 DIAGNOSIS — I110 Hypertensive heart disease with heart failure: Secondary | ICD-10-CM | POA: Insufficient documentation

## 2015-08-04 DIAGNOSIS — C773 Secondary and unspecified malignant neoplasm of axilla and upper limb lymph nodes: Secondary | ICD-10-CM | POA: Insufficient documentation

## 2015-08-04 DIAGNOSIS — I5032 Chronic diastolic (congestive) heart failure: Secondary | ICD-10-CM | POA: Insufficient documentation

## 2015-08-04 DIAGNOSIS — I440 Atrioventricular block, first degree: Secondary | ICD-10-CM | POA: Insufficient documentation

## 2015-08-04 DIAGNOSIS — E785 Hyperlipidemia, unspecified: Secondary | ICD-10-CM | POA: Diagnosis not present

## 2015-08-04 DIAGNOSIS — Z01812 Encounter for preprocedural laboratory examination: Secondary | ICD-10-CM | POA: Diagnosis not present

## 2015-08-04 DIAGNOSIS — Z7982 Long term (current) use of aspirin: Secondary | ICD-10-CM | POA: Insufficient documentation

## 2015-08-04 DIAGNOSIS — I872 Venous insufficiency (chronic) (peripheral): Secondary | ICD-10-CM | POA: Insufficient documentation

## 2015-08-04 DIAGNOSIS — Z01818 Encounter for other preprocedural examination: Secondary | ICD-10-CM | POA: Diagnosis not present

## 2015-08-04 DIAGNOSIS — Z87891 Personal history of nicotine dependence: Secondary | ICD-10-CM | POA: Diagnosis not present

## 2015-08-04 HISTORY — DX: Unspecified convulsions: R56.9

## 2015-08-04 LAB — CBC
HEMATOCRIT: 35.1 % — AB (ref 39.0–52.0)
HEMOGLOBIN: 11.1 g/dL — AB (ref 13.0–17.0)
MCH: 28.8 pg (ref 26.0–34.0)
MCHC: 31.6 g/dL (ref 30.0–36.0)
MCV: 91.2 fL (ref 78.0–100.0)
Platelets: 321 10*3/uL (ref 150–400)
RBC: 3.85 MIL/uL — ABNORMAL LOW (ref 4.22–5.81)
RDW: 13.4 % (ref 11.5–15.5)
WBC: 9 10*3/uL (ref 4.0–10.5)

## 2015-08-04 LAB — COMPREHENSIVE METABOLIC PANEL
ALBUMIN: 3.1 g/dL — AB (ref 3.5–5.0)
ALT: 14 U/L — ABNORMAL LOW (ref 17–63)
ANION GAP: 11 (ref 5–15)
AST: 19 U/L (ref 15–41)
Alkaline Phosphatase: 53 U/L (ref 38–126)
BUN: 7 mg/dL (ref 6–20)
CO2: 22 mmol/L (ref 22–32)
Calcium: 9.2 mg/dL (ref 8.9–10.3)
Chloride: 107 mmol/L (ref 101–111)
Creatinine, Ser: 0.84 mg/dL (ref 0.61–1.24)
GFR calc Af Amer: >60 mL/min (ref >60)
GFR calc non Af Amer: >60 mL/min (ref >60)
GLUCOSE: 91 mg/dL (ref 65–99)
POTASSIUM: 3.8 mmol/L (ref 3.5–5.1)
SODIUM: 140 mmol/L (ref 135–145)
Total Bilirubin: 0.6 mg/dL (ref 0.3–1.2)
Total Protein: 7.3 g/dL (ref 6.5–8.1)

## 2015-08-04 NOTE — Pre-Procedure Instructions (Signed)
Timothy Bradford  08/04/2015      [id number] [address], [place] [address] Phone: [phone] Fax: [phone]  [id number] [name], [place] [address] Phone: [phone] Fax: [phone]  [name] [place] [name], [place] [address] Phone: [phone] Fax: [phone]    Your procedure is scheduled on Tuesday, May 9th   Report to [facility] at 9:00 AM            (Posted surgery time 11:00 am to 12:28 pm)   Call this number if you have problems the morning of surgery:  [phone]   Remember:  Do not eat food or drink liquids after midnight Monday.  Take these medicines the morning of surgery with A SIP OF WATER : Lopressor             4-5 days prior to surgery, STOP taking any herbal supplements, vitamins, blood thinners, anti-inflammatories   Do not wear jewelry - no rings or watches.  Do not wear lotions or colognes.               Men may shave face and neck.  Do not bring valuables to the hospital.  [facility] is not responsible for any belongings or valuables.  Contacts, dentures or bridgework may not be worn into surgery.  Leave your suitcase in the car.  After surgery it may be brought to your room.  Patients discharged the day of surgery will not be allowed to drive home.   Name and phone number of your driver:     Please read over the following fact sheets that you were given. Pain Booklet and Surgical Site Infection Prevention

## 2015-08-04 NOTE — Progress Notes (Addendum)
PCP is Dr. Shawnie Pons with [facility] 12/2014.  Dr. Lindi Adie is oncologist.    Wife states that back 13 - 20 yrs ago had some seizures related to alcohol abuse (he stopped drinking in 1999) Had been diagnosed with 'chronic diastolic CHF' which at one time, he saw Dr. Sallyanne Kuster. (2014) Denies any cardiac issues.  Hasn't seen a cardio in 2-3 yrs.  Has had no issues with SOB, DOE, Chest pains.  No more seizures. Wife states that since the seizure, his short term memory is not good. Echo in 2016.  LOV note inside chart.

## 2015-08-04 NOTE — H&P (Signed)
Timothy Bradford 08/04/2015 10:22 AM Location: [facility] Patient #: [id number] DOB: 1935/06/09 Married / Language: English / Race: Black or African American Male  History of Present Illness Timothy Bradford A. [name] MD; 08/04/2015 10:37 AM) Patient words: discuss surgery  Patient returns for follow-up of his left axillary mass. Workup revealed metastatic squamous cell carcinoma to left axilla. His presumed to be from previous left shoulder skin cancer excision years ago. He has met with Dr. Konrad Dolores both agree to left axillary lymph node dissection would be the best course of action given his bulky lymphadenopathy. He is undergoing ENT evaluation as well but still needs to area removed. He will be followed by radiation therapy.  The patient is a 80 year old male.   Problem List/Past Medical Timothy Bradford, [place]; 08/04/2015 10:23 AM) SQUAMOUS CELL CARCINOMA (C80.1) AXILLARY MASS, LEFT (R22.32)  Other Problems Timothy Bradford, CMA; 08/04/2015 10:23 AM) Arthritis Congestive Heart Failure High blood pressure  Past Surgical History Timothy Bradford, CMA; 08/04/2015 10:23 AM) Shoulder Surgery Left. TURP  Diagnostic Studies History Timothy Bradford, [place]; 08/04/2015 10:23 AM) Colonoscopy 5-10 years ago  Allergies Timothy Bradford, [place]; 08/04/2015 10:24 AM) ACE Inhibitors Fosinopril Sodium *ANTIHYPERTENSIVES*  Medication History Timothy Bradford, CMA; 08/04/2015 10:24 AM) Aspirin (81MG Tablet, Oral daily) Active. Metoprolol Tartrate (25MG Tablet, Oral daily) Active. Zocor (40MG Tablet, Oral daily) Active. Medications Reconciled  Social History Timothy Bradford, [place]; 08/04/2015 10:24 AM) Alcohol use Remotely quit alcohol use. Caffeine use Carbonated beverages, Coffee. No drug use Tobacco use Former smoker.  Family History Timothy Bradford, [place]; 08/04/2015 10:24 AM) Alcohol Abuse Brother, Father, Mother, Sister. Arthritis Daughter. Depression  Daughter. Diabetes Mellitus Daughter. Hypertension Son. Migraine Headache Daughter.    Vitals [name] CMA; 08/04/2015 10:23 AM) 08/04/2015 10:23 AM Weight: 183.5 lb Height: 75in Body Surface Area: 2.12 m Body Mass Index: 22.94 kg/m  BP: 132/82 (Sitting, Left Arm, Standard)      Physical Exam (Lenis Nettleton A. Lundon Rosier MD; 08/04/2015 10:37 AM)  General Mental Status-Alert. General Appearance-Consistent with stated age. Hydration-Well hydrated. Voice-Normal.  Head and Neck Head-normocephalic, atraumatic with no lesions or palpable masses. Trachea-midline. Thyroid Gland Characteristics - normal size and consistency.  Chest and Lung Exam Chest and lung exam reveals -quiet, even and easy respiratory effort with no use of accessory muscles and on auscultation, normal breath sounds, no adventitious sounds and normal vocal resonance. Inspection Chest Wall - Normal. Back - normal.  Cardiovascular Cardiovascular examination reveals -normal heart sounds, regular rate and rhythm with no murmurs and normal pedal pulses bilaterally.  Lymphatic Note: Bulky fixed left axillary lymph nodes.    Assessment & Plan (Timothy Bradford A. Mattelyn Imhoff MD; 08/04/2015 10:37 AM)  AXILLARY MASS, LEFT (R22.32) Impression: Risk of s lymph node dissection include bleeding, infection, lymphedema, shoulder pain. stiffness, dye allergy. cosmetic deformity , blood clots, death, need for more surgery. Pt agres to proceed.  Risks for lymphedema is 50%. Risk of major blood vessel injury, nerve injury leading to dysfunction of extremity numbness is about 5%. Patient with metastatic squamous cell carcinoma to left axillary lymph nodes from previous left shoulder squamous cell carcinoma excision. Case discussed with Dr. Pablo Ledger we both agree that left axillary lymph node dissection is the best initial step. He may need chemotherapy and radiation therapy Afterwards but given his large tumor  burden require lymph node dissection left axilla.  Current Plans Pt Education - [facility] Free Text Education/Instructions: discussed with patient and provided information. METASTATIC SQUAMOUS CELL  CARCINOMA TO LYMPH NODE (C77.9)

## 2015-08-05 NOTE — Progress Notes (Signed)
Anesthesia Chart Review:  Pt is a 80 year old male scheduled for L axillary lymph node dissection on 08/12/2015 with Dr. Brantley Stage.   Pt receives care in [facility].   PMH includes:  chronic diastolic CHF, HTN, hyperlipidemia, chronic venous insufficiency, remote hx seizures. Former smoker. BMI 23.5  Medications include: ASA, metoprolol, simvastatin  Preoperative labs reviewed.    Chest x-ray 05/22/15 reviewed. No active cardiopulmonary disease.   EKG 08/04/15: Sinus rhythm with 1st degree A-V block.   Echo 10/02/14:  - Left ventricle: The cavity size was normal. Wall thickness was normal. Systolic function was at the lower limits of normal. The estimated ejection fraction was in the range of 50% to 55%. Wall motion was normal; there were no regional wall motion abnormalities. Doppler parameters are consistent with abnormal left ventricular relaxation (grade 1 diastolic dysfunction). - Right ventricle: The cavity size was mildly dilated. Wall thickness was normal. Systolic function was mildly reduced. - Right atrium: The atrium was mildly dilated.  Cardiac cath 11/13/02: The LV showed mild global hypokinesia. EF of 45-50%. LVEDP was 12 mmHg. Left main was long which was patent. LAD has 10% distal stenosis.The vessel proximally was very tortuous. Diagonal one was small which was patent. Left circumflex was patent proximally and in mid portion and then tapers down in AV groove after giving off OM-2. OM-1 was large which was patent. OM-2 was small which was patent. Lourena Simmonds was patent.  If no changes, I anticipate pt can proceed with surgery as scheduled.   Willeen Cass, FNP-BC [facility]/Anesthesiology Phone: [phone] 08/05/2015 2:10 PM

## 2015-08-11 MED ORDER — DEXTROSE 5 % IV SOLN
3.0000 g | INTRAVENOUS | Status: AC
  Administered 2015-08-12: 2 g via INTRAVENOUS
  Filled 2015-08-11: qty 3000

## 2015-08-12 ENCOUNTER — Inpatient Hospital Stay (HOSPITAL_COMMUNITY): Payer: Medicare Other | Admitting: Anesthesiology

## 2015-08-12 ENCOUNTER — Observation Stay (HOSPITAL_BASED_OUTPATIENT_CLINIC_OR_DEPARTMENT_OTHER)
Admission: RE | Admit: 2015-08-12 | Discharge: 2015-08-13 | Disposition: A | Discharge status: Home / Self Care | Payer: Medicare Other | Source: Ambulatory Visit | Attending: Surgery | Admitting: Surgery

## 2015-08-12 ENCOUNTER — Encounter (HOSPITAL_COMMUNITY)
Admission: RE | Disposition: A | Discharge status: Home / Self Care | Payer: Self-pay | Source: Ambulatory Visit | Attending: Surgery

## 2015-08-12 ENCOUNTER — Encounter (HOSPITAL_COMMUNITY): Payer: Self-pay | Admitting: Surgery

## 2015-08-12 ENCOUNTER — Inpatient Hospital Stay (HOSPITAL_COMMUNITY): Payer: Medicare Other | Admitting: Emergency Medicine

## 2015-08-12 DIAGNOSIS — Z79899 Other long term (current) drug therapy: Secondary | ICD-10-CM | POA: Diagnosis not present

## 2015-08-12 DIAGNOSIS — Z818 Family history of other mental and behavioral disorders: Secondary | ICD-10-CM | POA: Diagnosis not present

## 2015-08-12 DIAGNOSIS — R569 Unspecified convulsions: Secondary | ICD-10-CM | POA: Insufficient documentation

## 2015-08-12 DIAGNOSIS — I739 Peripheral vascular disease, unspecified: Secondary | ICD-10-CM | POA: Insufficient documentation

## 2015-08-12 DIAGNOSIS — Z8249 Family history of ischemic heart disease and other diseases of the circulatory system: Secondary | ICD-10-CM | POA: Insufficient documentation

## 2015-08-12 DIAGNOSIS — Z8261 Family history of arthritis: Secondary | ICD-10-CM | POA: Diagnosis not present

## 2015-08-12 DIAGNOSIS — M1990 Unspecified osteoarthritis, unspecified site: Secondary | ICD-10-CM | POA: Diagnosis not present

## 2015-08-12 DIAGNOSIS — D649 Anemia, unspecified: Secondary | ICD-10-CM | POA: Diagnosis not present

## 2015-08-12 DIAGNOSIS — Z833 Family history of diabetes mellitus: Secondary | ICD-10-CM | POA: Diagnosis not present

## 2015-08-12 DIAGNOSIS — C493 Malignant neoplasm of connective and soft tissue of thorax: Secondary | ICD-10-CM | POA: Diagnosis present

## 2015-08-12 DIAGNOSIS — Z87891 Personal history of nicotine dependence: Secondary | ICD-10-CM | POA: Diagnosis not present

## 2015-08-12 DIAGNOSIS — Z811 Family history of alcohol abuse and dependence: Secondary | ICD-10-CM | POA: Insufficient documentation

## 2015-08-12 DIAGNOSIS — Z7982 Long term (current) use of aspirin: Secondary | ICD-10-CM | POA: Diagnosis not present

## 2015-08-12 DIAGNOSIS — I110 Hypertensive heart disease with heart failure: Secondary | ICD-10-CM | POA: Diagnosis not present

## 2015-08-12 DIAGNOSIS — Z820 Family history of epilepsy and other diseases of the nervous system: Secondary | ICD-10-CM | POA: Insufficient documentation

## 2015-08-12 DIAGNOSIS — I509 Heart failure, unspecified: Secondary | ICD-10-CM | POA: Insufficient documentation

## 2015-08-12 DIAGNOSIS — C773 Secondary and unspecified malignant neoplasm of axilla and upper limb lymph nodes: Secondary | ICD-10-CM | POA: Insufficient documentation

## 2015-08-12 HISTORY — PX: AXILLARY LYMPH NODE DISSECTION: SHX5229

## 2015-08-12 LAB — CBC
HEMATOCRIT: 33.3 % — AB (ref 39.0–52.0)
Hemoglobin: 10.4 g/dL — ABNORMAL LOW (ref 13.0–17.0)
MCH: 28.3 pg (ref 26.0–34.0)
MCHC: 31.2 g/dL (ref 30.0–36.0)
MCV: 90.5 fL (ref 78.0–100.0)
Platelets: 346 10*3/uL (ref 150–400)
RBC: 3.68 MIL/uL — ABNORMAL LOW (ref 4.22–5.81)
RDW: 13.3 % (ref 11.5–15.5)
WBC: 8.3 10*3/uL (ref 4.0–10.5)

## 2015-08-12 LAB — CREATININE, SERUM
Creatinine, Ser: 0.78 mg/dL (ref 0.61–1.24)
GFR calc Af Amer: >60 mL/min (ref >60)

## 2015-08-12 SURGERY — LYMPHADENECTOMY, AXILLARY
Anesthesia: Regional | Site: Axilla | Laterality: Left

## 2015-08-12 MED ORDER — PHENYLEPHRINE HCL 10 MG/ML IJ SOLN
INTRAMUSCULAR | Status: DC | PRN
  Administered 2015-08-12 (×2): 120 ug via INTRAVENOUS

## 2015-08-12 MED ORDER — METHOCARBAMOL 500 MG PO TABS: 500.0000 mg | ORAL_TABLET | Freq: Four times a day (QID) | ORAL | Status: DC | PRN

## 2015-08-12 MED ORDER — DIPHENHYDRAMINE HCL 50 MG/ML IJ SOLN: 12.5000 mg | Freq: Four times a day (QID) | INTRAMUSCULAR | Status: DC | PRN

## 2015-08-12 MED ORDER — PHENYLEPHRINE 40 MCG/ML (10ML) SYRINGE FOR IV PUSH (FOR BLOOD PRESSURE SUPPORT)
PREFILLED_SYRINGE | INTRAVENOUS | Status: AC
  Filled 2015-08-12: qty 10

## 2015-08-12 MED ORDER — ZOLPIDEM TARTRATE 5 MG PO TABS: 5.0000 mg | ORAL_TABLET | Freq: Every evening | ORAL | Status: DC | PRN

## 2015-08-12 MED ORDER — OXYCODONE HCL 5 MG PO TABS: 5.0000 mg | ORAL_TABLET | ORAL | Status: DC | PRN

## 2015-08-12 MED ORDER — BUPIVACAINE-EPINEPHRINE (PF) 0.5% -1:200000 IJ SOLN
INTRAMUSCULAR | Status: DC | PRN
  Administered 2015-08-12: 30 mL

## 2015-08-12 MED ORDER — FENTANYL CITRATE (PF) 100 MCG/2ML IJ SOLN
INTRAMUSCULAR | Status: AC
  Filled 2015-08-12: qty 2

## 2015-08-12 MED ORDER — HYDROMORPHONE HCL 1 MG/ML IJ SOLN: 1.0000 mg | INTRAMUSCULAR | Status: DC | PRN

## 2015-08-12 MED ORDER — KCL IN DEXTROSE-NACL 20-5-0.9 MEQ/L-%-% IV SOLN
INTRAVENOUS | Status: DC
  Administered 2015-08-12: 17:00:00 via INTRAVENOUS
  Filled 2015-08-12 (×2): qty 1000

## 2015-08-12 MED ORDER — BUPIVACAINE-EPINEPHRINE 0.25% -1:200000 IJ SOLN
INTRAMUSCULAR | Status: DC | PRN
  Administered 2015-08-12: 20 mL

## 2015-08-12 MED ORDER — PROPOFOL 10 MG/ML IV BOLUS
INTRAVENOUS | Status: DC | PRN
  Administered 2015-08-12: 200 mg via INTRAVENOUS

## 2015-08-12 MED ORDER — METOPROLOL TARTRATE 25 MG PO TABS
25.0000 mg | ORAL_TABLET | Freq: Two times a day (BID) | ORAL | Status: DC
  Administered 2015-08-12: 25 mg via ORAL
  Filled 2015-08-12: qty 1

## 2015-08-12 MED ORDER — DEXAMETHASONE SODIUM PHOSPHATE 10 MG/ML IJ SOLN
INTRAMUSCULAR | Status: AC
  Filled 2015-08-12: qty 1

## 2015-08-12 MED ORDER — FENTANYL CITRATE (PF) 250 MCG/5ML IJ SOLN
INTRAMUSCULAR | Status: AC
  Filled 2015-08-12: qty 5

## 2015-08-12 MED ORDER — FENTANYL CITRATE (PF) 100 MCG/2ML IJ SOLN
75.0000 ug | Freq: Once | INTRAMUSCULAR | Status: AC
  Administered 2015-08-12: 75 ug via INTRAVENOUS

## 2015-08-12 MED ORDER — FENTANYL CITRATE (PF) 250 MCG/5ML IJ SOLN
INTRAMUSCULAR | Status: DC | PRN
  Administered 2015-08-12 (×4): 25 ug via INTRAVENOUS

## 2015-08-12 MED ORDER — ONDANSETRON HCL 4 MG/2ML IJ SOLN
INTRAMUSCULAR | Status: AC
  Filled 2015-08-12: qty 2

## 2015-08-12 MED ORDER — ONDANSETRON HCL 4 MG/2ML IJ SOLN: 4.0000 mg | Freq: Once | INTRAMUSCULAR | Status: DC | PRN

## 2015-08-12 MED ORDER — ONDANSETRON HCL 4 MG/2ML IJ SOLN: 4.0000 mg | Freq: Four times a day (QID) | INTRAMUSCULAR | Status: DC | PRN

## 2015-08-12 MED ORDER — DIPHENHYDRAMINE HCL 12.5 MG/5ML PO ELIX: 12.5000 mg | ORAL_SOLUTION | Freq: Four times a day (QID) | ORAL | Status: DC | PRN

## 2015-08-12 MED ORDER — ONDANSETRON HCL 4 MG/2ML IJ SOLN
INTRAMUSCULAR | Status: DC | PRN
  Administered 2015-08-12: 4 mg via INTRAVENOUS

## 2015-08-12 MED ORDER — FENTANYL CITRATE (PF) 100 MCG/2ML IJ SOLN: 25.0000 ug | INTRAMUSCULAR | Status: DC | PRN

## 2015-08-12 MED ORDER — 0.9 % SODIUM CHLORIDE (POUR BTL) OPTIME
TOPICAL | Status: DC | PRN
  Administered 2015-08-12: 1000 mL

## 2015-08-12 MED ORDER — ROCURONIUM BROMIDE 50 MG/5ML IV SOLN
INTRAVENOUS | Status: AC
  Filled 2015-08-12: qty 1

## 2015-08-12 MED ORDER — CHLORHEXIDINE GLUCONATE 4 % EX LIQD
1.0000 | Freq: Once | CUTANEOUS | Status: DC
Start: 2015-08-12 — End: 2015-08-12

## 2015-08-12 MED ORDER — PROPOFOL 10 MG/ML IV BOLUS
INTRAVENOUS | Status: AC
  Filled 2015-08-12: qty 20

## 2015-08-12 MED ORDER — SIMETHICONE 80 MG PO CHEW: 40.0000 mg | CHEWABLE_TABLET | Freq: Four times a day (QID) | ORAL | Status: DC | PRN

## 2015-08-12 MED ORDER — LIDOCAINE HCL (CARDIAC) 20 MG/ML IV SOLN
INTRAVENOUS | Status: DC | PRN
  Administered 2015-08-12: 60 mg via INTRAVENOUS

## 2015-08-12 MED ORDER — HYDROCODONE-ACETAMINOPHEN 7.5-325 MG PO TABS: 1.0000 | ORAL_TABLET | Freq: Once | ORAL | Status: DC | PRN

## 2015-08-12 MED ORDER — ONDANSETRON 4 MG PO TBDP: 4.0000 mg | ORAL_TABLET | Freq: Four times a day (QID) | ORAL | Status: DC | PRN

## 2015-08-12 MED ORDER — DEXAMETHASONE SODIUM PHOSPHATE 10 MG/ML IJ SOLN
INTRAMUSCULAR | Status: DC | PRN
  Administered 2015-08-12: 5 mg via INTRAVENOUS

## 2015-08-12 MED ORDER — MIDAZOLAM HCL 2 MG/2ML IJ SOLN
INTRAMUSCULAR | Status: AC
  Filled 2015-08-12: qty 2

## 2015-08-12 MED ORDER — PHENYLEPHRINE HCL 10 MG/ML IJ SOLN
10.0000 mg | INTRAVENOUS | Status: DC | PRN
  Administered 2015-08-12: 20 ug/min via INTRAVENOUS

## 2015-08-12 MED ORDER — ENOXAPARIN SODIUM 40 MG/0.4ML ~~LOC~~ SOLN: 40.0000 mg | SUBCUTANEOUS | Status: DC

## 2015-08-12 MED ORDER — BUPIVACAINE-EPINEPHRINE (PF) 0.25% -1:200000 IJ SOLN
INTRAMUSCULAR | Status: AC
  Filled 2015-08-12: qty 30

## 2015-08-12 MED ORDER — SIMVASTATIN 40 MG PO TABS
40.0000 mg | ORAL_TABLET | Freq: Every day | ORAL | Status: DC
  Administered 2015-08-12: 40 mg via ORAL
  Filled 2015-08-12: qty 1

## 2015-08-12 MED ORDER — LACTATED RINGERS IV SOLN
INTRAVENOUS | Status: DC
  Administered 2015-08-12 (×3): via INTRAVENOUS

## 2015-08-12 SURGICAL SUPPLY — 50 items
APPLIER CLIP 9.375 MED OPEN (MISCELLANEOUS) ×15
APR CLP MED 9.3 20 MLT OPN (MISCELLANEOUS) ×5
BLADE SURG ROTATE 9660 (MISCELLANEOUS) IMPLANT
CANISTER SUCTION 2500CC (MISCELLANEOUS) ×3 IMPLANT
CHLORAPREP W/TINT 26ML (MISCELLANEOUS) ×3 IMPLANT
CLIP APPLIE 9.375 MED OPEN (MISCELLANEOUS) ×1 IMPLANT
COVER SURGICAL LIGHT HANDLE (MISCELLANEOUS) ×3 IMPLANT
DECANTER SPIKE VIAL GLASS SM (MISCELLANEOUS) ×3 IMPLANT
DRAIN CHANNEL 19F RND (DRAIN) ×3 IMPLANT
DRAPE LAPAROTOMY T 98X78 PEDS (DRAPES) ×3 IMPLANT
DRSG ADAPTIC 3X8 NADH LF (GAUZE/BANDAGES/DRESSINGS) ×2 IMPLANT
DRSG PAD ABDOMINAL 8X10 ST (GAUZE/BANDAGES/DRESSINGS) ×3 IMPLANT
ELECT CAUTERY BLADE 6.4 (BLADE) ×3 IMPLANT
ELECT REM PT RETURN 9FT ADLT (ELECTROSURGICAL) ×3
ELECTRODE REM PT RTRN 9FT ADLT (ELECTROSURGICAL) ×1 IMPLANT
EVACUATOR SILICONE 100CC (DRAIN) ×3 IMPLANT
GAUZE SPONGE 4X4 12PLY STRL (GAUZE/BANDAGES/DRESSINGS) ×5 IMPLANT
GLOVE BIO SURGEON STRL SZ 6.5 (GLOVE) ×1 IMPLANT
GLOVE BIO SURGEON STRL SZ8 (GLOVE) ×3 IMPLANT
GLOVE BIO SURGEONS STRL SZ 6.5 (GLOVE) ×1
GLOVE BIOGEL PI IND STRL 7.0 (GLOVE) IMPLANT
GLOVE BIOGEL PI IND STRL 8 (GLOVE) ×1 IMPLANT
GLOVE BIOGEL PI INDICATOR 7.0 (GLOVE) ×4
GLOVE BIOGEL PI INDICATOR 8 (GLOVE) ×2
GOWN STRL REUS W/ TWL LRG LVL3 (GOWN DISPOSABLE) ×1 IMPLANT
GOWN STRL REUS W/ TWL XL LVL3 (GOWN DISPOSABLE) ×1 IMPLANT
GOWN STRL REUS W/TWL LRG LVL3 (GOWN DISPOSABLE) ×3
GOWN STRL REUS W/TWL XL LVL3 (GOWN DISPOSABLE) ×3
KIT BASIN OR (CUSTOM PROCEDURE TRAY) ×3 IMPLANT
KIT ROOM TURNOVER OR (KITS) ×3 IMPLANT
LIQUID BAND (GAUZE/BANDAGES/DRESSINGS) ×3 IMPLANT
NDL HYPO 25GX1X1/2 BEV (NEEDLE) ×1 IMPLANT
NEEDLE HYPO 25GX1X1/2 BEV (NEEDLE) ×3 IMPLANT
NS IRRIG 1000ML POUR BTL (IV SOLUTION) ×3 IMPLANT
PACK GENERAL/GYN (CUSTOM PROCEDURE TRAY) ×3 IMPLANT
PAD ABD 8X10 STRL (GAUZE/BANDAGES/DRESSINGS) ×2 IMPLANT
PAD ARMBOARD 7.5X6 YLW CONV (MISCELLANEOUS) ×3 IMPLANT
SPECIMEN JAR LARGE (MISCELLANEOUS) ×2 IMPLANT
SPECIMEN JAR MEDIUM (MISCELLANEOUS) ×3 IMPLANT
SPONGE LAP 18X18 X RAY DECT (DISPOSABLE) ×6 IMPLANT
SUT ETHILON 2 0 FS 18 (SUTURE) ×13 IMPLANT
SUT MNCRL AB 4-0 PS2 18 (SUTURE) ×3 IMPLANT
SUT VIC AB 2-0 BRD 54 (SUTURE) ×2 IMPLANT
SUT VIC AB 2-0 SH 18 (SUTURE) ×4 IMPLANT
SUT VIC AB 3-0 SH 18 (SUTURE) ×3 IMPLANT
SUT VICRYL AB 2 0 TIES (SUTURE) ×2 IMPLANT
SUT VICRYL AB 3 0 TIES (SUTURE) ×3 IMPLANT
SYR CONTROL 10ML LL (SYRINGE) ×3 IMPLANT
TOWEL OR 17X24 6PK STRL BLUE (TOWEL DISPOSABLE) ×3 IMPLANT
TOWEL OR 17X26 10 PK STRL BLUE (TOWEL DISPOSABLE) ×3 IMPLANT

## 2015-08-12 NOTE — Anesthesia Postprocedure Evaluation (Signed)
Anesthesia Post Note  Patient: Timothy Bradford  Procedure(s) Performed: Procedure(s) (LRB): LEFT AXILLARY LYMPH NODE DISSECTION (Left)  Patient location during evaluation: PACU Anesthesia Type: General and Regional Level of consciousness: awake and alert Pain management: pain level controlled Vital Signs Assessment: post-procedure vital signs reviewed and stable Respiratory status: spontaneous breathing, nonlabored ventilation, respiratory function stable and patient connected to nasal cannula oxygen Cardiovascular status: blood pressure returned to baseline and stable Postop Assessment: no signs of nausea or vomiting Anesthetic complications: no    Last Vitals:  Filed Vitals:   08/12/15 0932 08/12/15 1306  BP: 141/74 150/78  Pulse: 77   Temp:  36.8 C  Resp: 19 16    Last Pain:  Filed Vitals:   08/12/15 1315  PainSc: 0-No pain                 Zenaida Deed

## 2015-08-12 NOTE — H&P (View-Only) (Signed)
Timothy Bradford 08/04/2015 10:22 AM Location: [facility] Patient #: [id number] DOB: 10/16/1935 Married / Language: English / Race: Black or African American Male  History of Present Illness (Goble Fudala A. Maelie Chriswell MD; 08/04/2015 10:37 AM) Patient words: discuss surgery  Patient returns for follow-up of his left axillary mass. Workup revealed metastatic squamous cell carcinoma to left axilla. His presumed to be from previous left shoulder skin cancer excision years ago. He has met with Dr. Worthley both agree to left axillary lymph node dissection would be the best course of action given his bulky lymphadenopathy. He is undergoing ENT evaluation as well but still needs to area removed. He will be followed by radiation therapy.  The patient is a 79 year old male.   Problem List/Past Medical (Michelle R Brooks, CMA; 08/04/2015 10:23 AM) SQUAMOUS CELL CARCINOMA (C80.1) AXILLARY MASS, LEFT (R22.32)  Other Problems (Michelle R Brooks, CMA; 08/04/2015 10:23 AM) Arthritis Congestive Heart Failure High blood pressure  Past Surgical History (Michelle R Brooks, CMA; 08/04/2015 10:23 AM) Shoulder Surgery Left. TURP  Diagnostic Studies History (Michelle R Brooks, CMA; 08/04/2015 10:23 AM) Colonoscopy 5-10 years ago  Allergies (Michelle R Brooks, CMA; 08/04/2015 10:24 AM) ACE Inhibitors Fosinopril Sodium *ANTIHYPERTENSIVES*  Medication History (Michelle R Brooks, CMA; 08/04/2015 10:24 AM) Aspirin (81MG Tablet, Oral daily) Active. Metoprolol Tartrate (25MG Tablet, Oral daily) Active. Zocor (40MG Tablet, Oral daily) Active. Medications Reconciled  Social History (Michelle R Brooks, CMA; 08/04/2015 10:24 AM) Alcohol use Remotely quit alcohol use. Caffeine use Carbonated beverages, Coffee. No drug use Tobacco use Former smoker.  Family History (Michelle R Brooks, CMA; 08/04/2015 10:24 AM) Alcohol Abuse Brother, Father, Mother, Sister. Arthritis Daughter. Depression  Daughter. Diabetes Mellitus Daughter. Hypertension Son. Migraine Headache Daughter.    Vitals (Michelle R. Brooks CMA; 08/04/2015 10:23 AM) 08/04/2015 10:23 AM Weight: 183.5 lb Height: 75in Body Surface Area: 2.12 m Body Mass Index: 22.94 kg/m  BP: 132/82 (Sitting, Left Arm, Standard)      Physical Exam (Audrey Thull A. Canary Fister MD; 08/04/2015 10:37 AM)  General Mental Status-Alert. General Appearance-Consistent with stated age. Hydration-Well hydrated. Voice-Normal.  Head and Neck Head-normocephalic, atraumatic with no lesions or palpable masses. Trachea-midline. Thyroid Gland Characteristics - normal size and consistency.  Chest and Lung Exam Chest and lung exam reveals -quiet, even and easy respiratory effort with no use of accessory muscles and on auscultation, normal breath sounds, no adventitious sounds and normal vocal resonance. Inspection Chest Wall - Normal. Back - normal.  Cardiovascular Cardiovascular examination reveals -normal heart sounds, regular rate and rhythm with no murmurs and normal pedal pulses bilaterally.  Lymphatic Note: Bulky fixed left axillary lymph nodes.    Assessment & Plan (Lanijah Warzecha A. Azula Zappia MD; 08/04/2015 10:37 AM)  AXILLARY MASS, LEFT (R22.32) Impression: Risk of s lymph node dissection include bleeding, infection, lymphedema, shoulder pain. stiffness, dye allergy. cosmetic deformity , blood clots, death, need for more surgery. Pt agres to proceed.  Risks for lymphedema is 50%. Risk of major blood vessel injury, nerve injury leading to dysfunction of extremity numbness is about 5%. Patient with metastatic squamous cell carcinoma to left axillary lymph nodes from previous left shoulder squamous cell carcinoma excision. Case discussed with Dr. Wentworth we both agree that left axillary lymph node dissection is the best initial step. He may need chemotherapy and radiation therapy Afterwards but given his large tumor  burden require lymph node dissection left axilla.  Current Plans Pt Education - [facility] Free Text Education/Instructions: discussed with patient and provided information. METASTATIC SQUAMOUS CELL   CARCINOMA TO LYMPH NODE (C77.9)

## 2015-08-12 NOTE — Transfer of Care (Signed)
Immediate Anesthesia Transfer of Care Note  Patient: Timothy Bradford  Procedure(s) Performed: Procedure(s): LEFT AXILLARY LYMPH NODE DISSECTION (Left)  Patient Location: PACU  Anesthesia Type:General  Level of Consciousness: awake  Airway & Oxygen Therapy: Patient Spontanous Breathing and Patient connected to nasal cannula oxygen  Post-op Assessment: Report given to RN and Post -op Vital signs reviewed and stable  Post vital signs: Reviewed and stable  Last Vitals:  Filed Vitals:   08/12/15 0927 08/12/15 0932  BP: 140/72 141/74  Pulse: 77 77  Temp:    Resp: 16 19    Last Pain: There were no vitals filed for this visit.       Complications: No apparent anesthesia complications

## 2015-08-12 NOTE — Anesthesia Procedure Notes (Addendum)
Anesthesia Regional Block:  Pectoralis block  Pre-Anesthetic Checklist: ,, timeout performed, Correct Patient, Correct Site, Correct Laterality, Correct Procedure, Correct Position, site marked, Risks and benefits discussed,  Surgical consent,  Pre-op evaluation,  At surgeon's request and post-op pain management  Laterality: Left  Prep: chloraprep       Needles:  Injection technique: Single-shot  Needle Type: Stimiplex     Needle Length: 9cm 9 cm Needle Gauge: 21 and 21 G    Additional Needles:  Procedures: ultrasound guided (picture in chart) Pectoralis block Narrative:  Injection made incrementally with aspirations every 5 mL.  Performed by: Personally  Anesthesiologist: JUDD, BENJAMIN  Additional Notes: Risks, benefits and alternative to block explained extensively.  Patient tolerated procedure well, without complications.   Procedure Name: LMA Insertion Date/Time: 08/12/2015 11:07 AM Performed by: Rejeana Brock L Pre-anesthesia Checklist: Patient identified, Timeout performed, Emergency Drugs available, Suction available and Patient being monitored Patient Re-evaluated:Patient Re-evaluated prior to inductionOxygen Delivery Method: Circle system utilized Preoxygenation: Pre-oxygenation with 100% oxygen Intubation Type: IV induction Ventilation: Mask ventilation without difficulty LMA: LMA inserted LMA Size: 4.0 Number of attempts: 1 Placement Confirmation: positive ETCO2 and breath sounds checked- equal and bilateral Tube secured with: Tape Dental Injury: Teeth and Oropharynx as per pre-operative assessment

## 2015-08-12 NOTE — Brief Op Note (Signed)
08/12/2015  12:55 PM  PATIENT:  Pernell Dupre  80 y.o. 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  PROCEDURE:  Procedure(s): LEFT AXILLARY LYMPH NODE DISSECTION (Left)  SURGEON:  Surgeon(s) and Role:    * Erroll Luna, MD - Primary  :   ASSISTANTS: [place]  RNFA   ANESTHESIA:   local, general and PEC BLOCK  EBL:  Total I/O In: 1200 [I.V.:1200] Out: 30 [Blood:30]  BLOOD ADMINISTERED:none  DRAINS: none   LOCAL MEDICATIONS USED:  BUPIVICAINE   SPECIMEN:  Source of Specimen:  LEFT AXILLARY CONTENTS  DISPOSITION OF SPECIMEN:  PATHOLOGY  COUNTS:  YES  TOURNIQUET:  * No tourniquets in log *  DICTATION: .Other Dictation: Dictation Number [id number]  PLAN OF CARE: Admit for overnight observation  PATIENT DISPOSITION:  PACU - hemodynamically stable.   Delay start of Pharmacological VTE agent (>24hrs) due to surgical blood loss or risk of bleeding: no

## 2015-08-12 NOTE — Interval H&P Note (Signed)
History and Physical Interval Note:  08/12/2015 10:34 AM  Timothy Bradford  has presented today for surgery, with the diagnosis of Squamous cell carcinoma of the skin metastatic to axillary lymph node  The various methods of treatment have been discussed with the patient and family. After consideration of risks, benefits and other options for treatment, the patient has consented to  Procedure(s): AXILLARY LYMPH NODE DISSECTION, LEFT (Left) as a surgical intervention .  The patient's history has been reviewed, patient examined, no change in status, stable for surgery.  I have reviewed the patient's chart and labs.  Questions were answered to the patient's satisfaction.     Birdie Beveridge A.

## 2015-08-12 NOTE — Anesthesia Preprocedure Evaluation (Addendum)
Anesthesia Evaluation  Patient identified by MRN, date of birth, ID band Patient awake    Reviewed: Allergy & Precautions, H&P , NPO status , Patient's Chart, lab work & pertinent test results  History of Anesthesia Complications Negative for: history of anesthetic complications  Airway Mallampati: II  TM Distance: >3 FB Neck ROM: full    Dental  (+) Poor Dentition Really poor dentition, reports no loose teeth:   Pulmonary former smoker,    Pulmonary exam normal breath sounds clear to auscultation       Cardiovascular hypertension, + Peripheral Vascular Disease  Normal cardiovascular exam Rhythm:regular Rate:Normal  Echo EF 45-50%   Neuro/Psych Seizures -,     GI/Hepatic negative GI ROS, Neg liver ROS,   Endo/Other  negative endocrine ROS  Renal/GU negative Renal ROS     Musculoskeletal  (+) Arthritis ,   Abdominal   Peds  Hematology negative hematology ROS (+) anemia ,   Anesthesia Other Findings   Reproductive/Obstetrics negative OB ROS                            Anesthesia Physical Anesthesia Plan  ASA: III  Anesthesia Plan: General and Regional   Post-op Pain Management: GA combined w/ Regional for post-op pain   Induction: Intravenous  Airway Management Planned: LMA  Additional Equipment:   Intra-op Plan:   Post-operative Plan: Extubation in OR  Informed Consent: I have reviewed the patients History and Physical, chart, labs and discussed the procedure including the risks, benefits and alternatives for the proposed anesthesia with the patient or authorized representative who has indicated his/her understanding and acceptance.   Dental Advisory Given  Plan Discussed with: Anesthesiologist, CRNA and Surgeon  Anesthesia Plan Comments: (Plan PECS block)        Anesthesia Quick Evaluation

## 2015-08-13 ENCOUNTER — Encounter (HOSPITAL_COMMUNITY): Payer: Self-pay | Admitting: Surgery

## 2015-08-13 DIAGNOSIS — C493 Malignant neoplasm of connective and soft tissue of thorax: Secondary | ICD-10-CM | POA: Diagnosis not present

## 2015-08-13 LAB — CBC
HCT: 35 % — ABNORMAL LOW (ref 39.0–52.0)
Hemoglobin: 11 g/dL — ABNORMAL LOW (ref 13.0–17.0)
MCH: 28.9 pg (ref 26.0–34.0)
MCHC: 31.4 g/dL (ref 30.0–36.0)
MCV: 92.1 fL (ref 78.0–100.0)
PLATELETS: 338 10*3/uL (ref 150–400)
RBC: 3.8 MIL/uL — ABNORMAL LOW (ref 4.22–5.81)
RDW: 13.4 % (ref 11.5–15.5)
WBC: 8.9 10*3/uL (ref 4.0–10.5)

## 2015-08-13 LAB — COMPREHENSIVE METABOLIC PANEL
ALK PHOS: 45 U/L (ref 38–126)
ALT: 11 U/L — AB (ref 17–63)
AST: 13 U/L — AB (ref 15–41)
Albumin: 2.6 g/dL — ABNORMAL LOW (ref 3.5–5.0)
Anion gap: 10 (ref 5–15)
BILIRUBIN TOTAL: 0.4 mg/dL (ref 0.3–1.2)
BUN: 8 mg/dL (ref 6–20)
CALCIUM: 8.6 mg/dL — AB (ref 8.9–10.3)
CO2: 23 mmol/L (ref 22–32)
CREATININE: 0.76 mg/dL (ref 0.61–1.24)
Chloride: 109 mmol/L (ref 101–111)
GFR calc Af Amer: >60 mL/min (ref >60)
Glucose, Bld: 168 mg/dL — ABNORMAL HIGH (ref 65–99)
Potassium: 4.3 mmol/L (ref 3.5–5.1)
Sodium: 142 mmol/L (ref 135–145)
TOTAL PROTEIN: 6 g/dL — AB (ref 6.5–8.1)

## 2015-08-13 MED ORDER — OXYCODONE HCL 5 MG PO TABS: 5.0000 mg | ORAL_TABLET | ORAL | Status: DC | PRN

## 2015-08-13 NOTE — Progress Notes (Signed)
Pt discharged to home.  D[facility]carge instructions explained to pt.  Pt and family member shown how to empty JP drain and document of JP drain sheet. IV removed.  Pt states he has all belongings.  Pt taken off floor in wheelchair by volunteer services.

## 2015-08-13 NOTE — Progress Notes (Signed)
1 Day Post-Op  Subjective: No complaints denies pain can move left arm   Objective: Vital signs in last 24 hours: Temp:  [98.1 F (36.7 C)-98.7 F (37.1 C)] 98.6 F (37 C) (05/10 0506) Pulse Rate:  [39-80] 62 (05/10 0506) Resp:  [10-20] 19 (05/10 0506) BP: (119-166)/(66-95) 153/72 mmHg (05/10 0506) SpO2:  [96 %-100 %] 98 % (05/10 0506) Weight:  [83.008 kg (183 lb)] 83.008 kg (183 lb) (05/09 0901) Last BM Date: 08/12/15  Intake/Output from previous day: 05/09 0701 - 05/10 0700 In: 2074 [I.V.:2040.7; IV Piggyback:33.3] Out: 1790 [Urine:1600; Drains:160; Blood:30] Intake/Output this shift:    Incision/Wound:dressing left axilla dry JP serous wound intact  Lab Results:   Recent Labs  08/12/15 1428 08/13/15 0534  WBC 8.3 8.9  HGB 10.4* 11.0*  HCT 33.3* 35.0*  PLT 346 338   BMET  Recent Labs  08/12/15 1428 08/13/15 0534  NA  --  142  K  --  4.3  CL  --  109  CO2  --  23  GLUCOSE  --  168*  BUN  --  8  CREATININE 0.78 0.76  CALCIUM  --  8.6*   PT/INR No results for input(s): LABPROT, INR in the last 72 hours. ABG No results for input(s): PHART, HCO3 in the last 72 hours.  Invalid input(s): PCO2, PO2  Studies/Results: No results found.  Anti-infectives: Anti-infectives    Start     Dose/Rate Route Frequency Ordered Stop   08/12/15 0600  ceFAZolin (ANCEF) 3 g in dextrose 5 % 50 mL IVPB     3 g 130 mL/hr over 30 Minutes Intravenous On call to O.R. 08/11/15 1332 08/12/15 1123      Assessment/Plan: s/p Procedure(s): LEFT AXILLARY LYMPH NODE DISSECTION (Left) Discharge  LOS: 1 day    Akila Batta A. 08/13/2015

## 2015-08-13 NOTE — Discharge Summary (Signed)
Physician Discharge Summary  Patient ID: Jontavious Duman MRN: [id number] DOB/AGE: 05-23-1935 80 y.o.  Admit date: 08/12/2015 Discharge date: 08/13/2015  Admission Diagnoses:left axillary mass   Discharge Diagnoses:  Active Problems:   Axillary mass   Discharged Condition: good  Hospital Course: unremarkable Serous output from JP labs stable  Good pain control  Consults: None  Significant Diagnostic Studies: labs:  CBC    Component Value Date/Time   WBC 8.9 08/13/2015 0534   WBC 6.7 05/22/2015 1129   RBC 3.80* 08/13/2015 0534   RBC 4.24 05/22/2015 1129   RBC 4.05* 07/23/2013 0944   HGB 11.0* 08/13/2015 0534   HCT 35.0* 08/13/2015 0534   HCT 38.9 05/22/2015 1129   PLT 338 08/13/2015 0534   PLT 256 05/22/2015 1129   MCV 92.1 08/13/2015 0534   MCV 92 05/22/2015 1129   MCH 28.9 08/13/2015 0534   MCH 30.2 05/22/2015 1129   MCHC 31.4 08/13/2015 0534   MCHC 32.9 05/22/2015 1129   RDW 13.4 08/13/2015 0534   RDW 13.4 05/22/2015 1129   LYMPHSABS 2.1 05/22/2015 1129   LYMPHSABS 1.7 07/13/2013 1640   MONOABS 1.8* 07/13/2013 1640   EOSABS 0.3 05/22/2015 1129   EOSABS 0.0 07/13/2013 1640   BASOSABS 0.0 05/22/2015 1129   BASOSABS 0.0 07/13/2013 1640   CMP Latest Ref Rng 08/13/2015 08/12/2015 08/04/2015  Glucose 65 - 99 mg/dL 168(H) - 91  BUN 6 - 20 mg/dL 8 - 7  Creatinine 0.61 - 1.24 mg/dL 0.76 0.78 0.84  Sodium 135 - 145 mmol/L 142 - 140  Potassium 3.5 - 5.1 mmol/L 4.3 - 3.8  Chloride 101 - 111 mmol/L 109 - 107  CO2 22 - 32 mmol/L 23 - 22  Calcium 8.9 - 10.3 mg/dL 8.6(L) - 9.2  Total Protein 6.5 - 8.1 g/dL 6.0(L) - 7.3  Total Bilirubin 0.3 - 1.2 mg/dL 0.4 - 0.6  Alkaline Phos 38 - 126 U/L 45 - 53  AST 15 - 41 U/L 13(L) - 19  ALT 17 - 63 U/L 11(L) - 14(L)      Treatments: surgery: left axillary mass excision  Discharge Exam: Blood pressure 153/72, pulse 62, temperature 98.6 F (37 C), temperature source Oral, resp. rate 19, height 6\' 2"  (1.88 m), weight 83.008 kg (183  lb), SpO2 98 %. Incision/Wound:clean intact  no signs of hematoma  Drain serous can move left arm   Disposition: 01-Home or Self Care     Medication List    ASK your doctor about these medications        acetaminophen 500 MG tablet  Commonly known as:  TYLENOL  Take 1,000 mg by mouth every 8 (eight) hours as needed.     aspirin EC 81 MG tablet  Take 81 mg by mouth daily.     metoprolol tartrate 25 MG tablet  Commonly known as:  LOPRESSOR  Take 1 tablet (25 mg total) by mouth 2 (two) times daily.     simvastatin 40 MG tablet  Commonly known as:  ZOCOR  TAKE 1 TABLET BY MOUTH AT BEDTIME         Signed: Caron Ode A. 08/13/2015, 7:34 AM

## 2015-08-13 NOTE — Discharge Instructions (Signed)
Bulb Drain Home Care A bulb drain consists of a thin rubber tube and a soft, round bulb that creates a gentle suction. The rubber tube is placed in the area where you had surgery. A bulb is attached to the end of the tube that is outside the body. The bulb drain removes excess fluid that normally builds up in a surgical wound after surgery. The color and amount of fluid will vary. Immediately after surgery, the fluid is bright red and is a little thicker than water. It may gradually change to a yellow or pink color and become more thin and water-like. When the amount decreases to about 1 or 2 tbsp in 24 hours, your health care provider will usually remove it. DAILY CARE  Keep the bulb flat (compressed) at all times, except while emptying it. The flatness creates suction. You can flatten the bulb by squeezing it firmly in the middle and then closing the cap.  Keep sites where the tube enters the skin dry and covered with a bandage (dressing).  Secure the tube 1-2 in (2.5-5.1 cm) below the insertion sites to keep it from pulling on your stitches. The tube is stitched in place and will not slip out.  Secure the bulb as directed by your health care provider.  For the first 3 days after surgery, there usually is more fluid in the bulb. Empty the bulb whenever it becomes half full because the bulb does not create enough suction if it is too full. The bulb could also overflow. Write down how much fluid you remove each time you empty your drain. Add up the amount removed in 24 hours.  Empty the bulb at the same time every day once the amount of fluid decreases and you only need to empty it once a day. Write down the amounts and the 24-hour totals to give to your health care provider. This helps your health care provider know when the tubes can be removed. EMPTYING THE BULB DRAIN Before emptying the bulb, get a measuring cup, a piece of paper and a pen, and wash your hands.  Gently run your fingers  down the tube (stripping) to empty any drainage from the tubing into the bulb. This may need to be done several times a day to clear the tubing of clots and tissue.  Open the bulb cap to release suction, which causes it to inflate. Do not touch the inside of the cap.  Gently run your fingers down the tube (stripping) to empty any drainage from the tubing into the bulb.  Hold the cap out of the way, and pour fluid into the measuring cup.   Squeeze the bulb to provide suction.  Replace the cap.   Check the tape that holds the tube to your skin. If it is becoming loose, you can remove the loose piece of tape and apply a new one. Then, pin the bulb to your shirt.   Write down the amount of fluid you emptied out. Write down the date and each time you emptied your bulb drain. (If there are 2 bulbs, note the amount of drainage from each bulb and keep the totals separate. Your health care provider will want to know the total amounts for each drain and which tube is draining more.)   Flush the fluid down the toilet and wash your hands.   Call your health care provider once you have less than 2 tbsp of fluid collecting in the bulb drain every 24  hours. If there is drainage around the tube site, change dressings and keep the area dry. Cleanse around tube with sterile saline and place dry gauze around site. This gauze should be changed when it is soiled. If it stays clean and unsoiled, it should still be changed daily.  SEEK MEDICAL CARE IF:  Your drainage has a bad smell or is cloudy.   You have a fever.   Your drainage is increasing instead of decreasing.   Your tube fell out.   You have redness or swelling around the tube site.   You have drainage from a surgical wound.   Your bulb drain will not stay flat after you empty it.  MAKE SURE YOU:   Understand these instructions.  Will watch your condition.  Will get help right away if you are not doing well or get worse.     This information is not intended to replace advice given to you by your health care provider. Make sure you discuss any questions you have with your health care provider.   Document Released: 03/19/2000 Document Revised: 04/12/2014 Document Reviewed: 10/09/2014 Elsevier Interactive Patient Education 2016 [place] [employer] AND SHOWER PUT NEOSPORIN OVER SUTURES AND PLACE CLEAN DRY GAUZE DAILY  NO LIFTING MORE THAN 10 LBS FOR 3 WEEKS

## 2015-08-13 NOTE — Op Note (Signed)
NAME:  Timothy Bradford, Timothy Bradford                ACCOUNT NO.:  [id number]  MEDICAL RECORD NO.:  [id number]  LOCATION:  6N28C                        FACILITY:  [facility]  PHYSICIAN:   A. 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  PROCEDURE:  Left axillary lymph node dissection.  SURGEON:   A. , M.D.  ANESTHESIA:  LMA with pectoral block and 0.25% Sensorcaine local with epinephrine.  EBL:  20 mL.  SPECIMEN:  Left axillary contents to Pathology.  DRAINS:  19 round drain to left axillary contents.  INDICATIONS FOR PROCEDURE:  The patient is a 79-year-old male with a slowly enlarging left axillary mass for the last year.  He has ignored this.  He was seen initially 2 months ago and sent for further workup, which included imaging as well as fine-needle aspiration, which showed squamous cell carcinoma.  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  He was worked up by Medical and Radiation Oncology as well and no other source of squamous cell cancer could be identified.  It was a large area about baseball size and Radiation Oncology did not feel that they could radiate this with any effectiveness and recommended left axillary lymph node dissection to him.  I have met him in the office to discuss the significance of this given its large bulky adenopathy and potential risk of injury to the axillary vein, axillary artery, the brachial plexus, long thoracic nerve and/or the thoracodorsal trunk, which all may need to be potentially sacrifice for a negative resection margin.  I went over the morbidity of this  as well as the potential weakness he would have in his left latissimus and potential winged scapula from all this.  This was discussed preoperatively.  Other potential risks could be recurrence, cosmetic deformity, neurological weakness of the left upper extremity, numbness in the left armpit, the need for other procedures and/or radiation therapy, which would give him the lifetime risk of lymphedema of well over 55%.  There was no other therapy from the standpoint of Radiation Therapy and Chemotherapy that was felt to be effective alone in this setting.  We discussed this at great length and he wished to proceed given the large nature of the mass in his left axilla.  DESCRIPTION OF PROCEDURE:  The patient was met in the holding area.  He underwent a pec block by Anesthesia.  Left side was marked as correct side.  He was taken back to the operating room and placed supine on the OR table.  After induction of general anesthesia, the left axilla was prepped and draped in the sterile fashion and time-out was done.  Local anesthesia consisting of 0.25% Sensorcaine with epinephrine was infiltrated into the skin.  The mass was about baseball size. Transverse incision was made in the left axillary contents and this was also involving the overlying skin.  So,   an ellipse of skin was taken with the mass.  We carefully dissected around the mass, which was in essence significant, madded left axillary lymph node adenopathy.  I was able to identify the axillary vein and axillary artery.  This was not invading it, but was densely adherent to the thoracodorsal trunk and the long thoracic nerve on that side.  It encapsulated both of the structures.  I began to dissect circumferentially around the mass and to sacrifice the left thoracodorsal trunk and left long thoracic nerve. This was not involving the left axillary vein, so we could preserve that.  We stayed below that structure.  This was done with clips  and 2-0 Vicryl ties and oversewing larger vessels with 2-0 stick ties of Vicryl. We slowly dissected around the mass and excised the lymph node basin, which were on the level 1 and level 2 nodes in this area as far as we could to remove any bulk disease.  This was growing into his left latissimus muscle and I took a margin of muscle to hopefully excise the gross disease to the best of our ability.  We dissected circumferentially around the more superior aspect staying away from the axillary vein and ligating any small tributaries to the mass area with clips and 2-0 and 3-0 Vicryl ties.  The entire mass was removed en bloc with an overlying skin ellipse adherent to it.  Larger wound was then made hemostatic with cautery.  Small clip was used to control small bleeding blood vessels.  The axillary vein was left intact.  The left axillary artery was also left intact as well as the brachial plexus structures, which were well above us.  The left long thoracic nerve was sacrificed with this specimen since it was invading this as well as the thoracodorsal trunk on the left side.  This was sent to Pathology. Wound was irrigated.  Through a separate stab incision, a 19 round drain was placed.  The wound was closed with a deep layer of 2-0 Vicryl and then 2-0 nylon was used to approximate the skin edges.  Vaseline gauze dressing was placed over this and the bulb was placed to suction with good seal.  All final counts were found to be correct of sponge, needle, and instruments.  The patient was then awoke at this point in time, extubated, taken to the recovery in satisfactory condition.      A. , M.D.     TAC/MEDQ  D:  08/12/2015  T:  08/13/2015  Job:  [id number]  cc:   Stacy Wentworth, M.D. 

## 2015-08-19 ENCOUNTER — Ambulatory Visit: Payer: Medicare Other | Admitting: Hematology and Oncology

## 2015-09-03 ENCOUNTER — Telehealth: Payer: Self-pay | Admitting: *Deleted

## 2015-09-03 NOTE — Telephone Encounter (Signed)
CALLED PATIENT TO INFORM OF [facility]  APPT. AND SIM ON 09-17-15, LVM FOR A RETURN CALL

## 2015-09-11 NOTE — Progress Notes (Signed)
Location of Axillary Cancer:Left Axillary  Histology per Pathology Report: Diagnosis 08-12-15 Lymph nodes, regional resection, left axillary - NODULAR MASS OF SQUAMOUS CELL CARCINOMA INVOLVING THE DEEP DERMIS AND SUBCUTANEOUS SOFT TISSUE. - TUMOR MEASURES 8.6 CM IN GREATEST DIMENSION. - OVERLYING BENIGN SKIN. - FIVE BENIGN LYMPH NODES WITH NO TUMOR SEEN (0/5). - INCIDENTAL BENIGN FIBROBLASTIC PROLIFERATION CONSISTENT WITH FIBROMA FORMATION.  Diagnosis  06-19-15 Lymph node, needle/core biopsy, left axillary mass - METASTATIC SQUAMOUS CELL CARCINOMA   Mr. Lenord Hashimi  presented with a mass in the left axilla found by his wife.  Past/Anticipated interventions by surgeon, if any: Left axilla biopsy  Past/Anticipated interventions by medical oncology, if any: Radiation referral  Lymphedema issues, if any: Yes  Pain issues, if any: No  SAFETY ISSUES:  Prior radiation? : No  Pacemaker/ICD? : No  Possible current pregnancy?: No  Is the patient on methotrexate? : No  Current Complaints / other details: Here to discuss radiation plan.      Georgena Spurling, RN 09/11/2015,10:25 AM

## 2015-09-17 ENCOUNTER — Ambulatory Visit: Admission: RE | Admit: 2015-09-17 | Payer: Medicare Other | Source: Ambulatory Visit | Admitting: Radiation Oncology

## 2015-09-17 ENCOUNTER — Ambulatory Visit
Admission: RE | Admit: 2015-09-17 | Discharge: 2015-09-17 | Disposition: A | Discharge status: Home / Self Care | Payer: Medicare Other | Source: Ambulatory Visit | Attending: Radiation Oncology | Admitting: Radiation Oncology

## 2015-09-17 ENCOUNTER — Encounter: Payer: Self-pay | Admitting: Radiation Oncology

## 2015-09-17 VITALS — BP 166/80 | HR 76 | Temp 98.0°F | Resp 18 | Ht 74.0 in | Wt 192.4 lb

## 2015-09-17 DIAGNOSIS — IMO0002 Reserved for concepts with insufficient information to code with codable children: Secondary | ICD-10-CM

## 2015-09-17 DIAGNOSIS — Z510 Encounter for antineoplastic radiation therapy: Secondary | ICD-10-CM | POA: Diagnosis not present

## 2015-09-17 DIAGNOSIS — C799 Secondary malignant neoplasm of unspecified site: Secondary | ICD-10-CM

## 2015-09-17 NOTE — Addendum Note (Signed)
Encounter addended by: Thea Silversmith, MD on: 09/17/2015  1:29 PM<BR>     Documentation filed: Notes Section

## 2015-09-17 NOTE — Addendum Note (Signed)
Encounter addended by: Malena Edman, RN on: 09/17/2015  6:45 PM<BR>     Documentation filed: Charges VN

## 2015-09-17 NOTE — Progress Notes (Signed)
   Department of Radiation Oncology  Phone:  [phone] Fax:        [phone]   Name: Timothy Bradford MRN: [id number]  DOB: 10-Apr-1935  Date: 09/17/2015  Follow Up Visit Note  Diagnosis: Metastatic squamous cell carcinoma in left axillary.  Interval History: Timothy Bradford presents today for routine followup. I saw him last during our initial consultation 07/10/2015. He presented to his physician with a painful enlarging mass in left axilla in February 2017. He noticed the nodule in his left armpit for about 2 months prior to going to his physician. He had a lymph node biopsy of the left axillary mass 06/19/2015, revealing metastatic squamous cell carcinoma. He had a PET scan 07/08/2015, revealing a large hypermetabolic mass identified within the left axilla, no evidence for distant metastatic disease. He is accompanied today by his wife and daughter. He does not complain of weight loss, throat pain, or dysphagia. He had skin cancer removed in 2014 from his left shoulder. The pathology report revealed squamous cell carcinoma in situ with the lesion extending to the base and lateral edges of the biopsy. Per his primary care physician's notes, he discussed this pathology report with the skin center and was told this was invasive squamous cell carcinoma removed with clear margins.  Since then, he had an axillary node dissection 08/12/2015. The results showed a mass of squamous cell carcinoma involving the deep dermis and subcutaneous soft tissue measuring 8.6 cm. There were 5 benign lymph nodes that were also removed. There was no perineural invasion and the tumor did not involve the sin surface. It was difficult to tell if this was an intradermal metastasis or a completely replaced lymph node. After discussing his case with pathology, given the size of his previous left shoulder skin cancer, I favor this being an axillary lymph node metastasis and will recommend treating it as such.   He mentions that he does have  lymphedema. He denies any pain. He has good range of motion in his arms. He is accompanied by his wife.    Physical Exam:  Filed Vitals:   09/17/15 1356  BP: 166/80  Pulse: 76  Temp: 98 F (36.7 C)  TempSrc: Oral  Resp: 18  Height: 6\' 2"  (1.88 m)  Weight: 192 lb 6.4 oz (87.272 kg)  SpO2: 98%   Normal range of motion. Incision is well healed. Slight swelling above the elbow.   IMPRESSION: Timothy Bradford is a 80 y.o. male with metastatic squamous cell carcinoma in left axillary. He is a good candidate for external radiation.   PLAN: We discussed the role of radiation and its side effects, including skin redness, fatigue, skin breakdown, hypopigmentation, and lymphedema. We discussed that he will need 30 treatments This procedure has been fully reviewed with the patient and written informed consent has been obtained. His simulation appointment is scheduled 09/23/2015 at 10 am. He will receive 60 Gy in 30 fractions.  I've asked him to go to our after breast cancer class to learn about lymphedema. I gave him this information.  ------------------------------------------------  Thea Silversmith, MD    This document serves as a record of services personally performed by Thea Silversmith, MD. It was created on her behalf by  Lendon Collar, a trained medical scribe. The creation of this record is based on the scribe's personal observations and the provider's statements to them. This document has been checked and approved by the attending provider.

## 2015-09-18 NOTE — Addendum Note (Signed)
Encounter addended by: Malena Edman, RN on: 09/18/2015 10:00 AM<BR>     Documentation filed: Charges VN

## 2015-09-23 ENCOUNTER — Encounter: Payer: Self-pay | Admitting: Radiation Oncology

## 2015-09-23 ENCOUNTER — Ambulatory Visit
Admission: RE | Admit: 2015-09-23 | Discharge: 2015-09-23 | Disposition: A | Discharge status: Home / Self Care | Payer: Medicare Other | Source: Ambulatory Visit | Attending: Radiation Oncology | Admitting: Radiation Oncology

## 2015-09-23 DIAGNOSIS — C799 Secondary malignant neoplasm of unspecified site: Secondary | ICD-10-CM

## 2015-09-23 DIAGNOSIS — IMO0002 Reserved for concepts with insufficient information to code with codable children: Secondary | ICD-10-CM

## 2015-09-23 DIAGNOSIS — Z510 Encounter for antineoplastic radiation therapy: Secondary | ICD-10-CM | POA: Diagnosis not present

## 2015-09-23 NOTE — Progress Notes (Signed)
Financial Counseling--spoke with patient and his wife today--patient receives around 1200.00 per month in disability--wife makes about 1100.00 per month at her job--they will bring in income verification to see if they qualify for the [place] tomorrow

## 2015-09-24 ENCOUNTER — Encounter: Payer: Self-pay | Admitting: Radiation Oncology

## 2015-09-24 NOTE — Progress Notes (Signed)
Financial Counseling--spoke with patient and wife on 6/19--they are going to bring in income verification to see if they qualify for [place]

## 2015-09-25 DIAGNOSIS — Z510 Encounter for antineoplastic radiation therapy: Secondary | ICD-10-CM | POA: Diagnosis not present

## 2015-09-26 ENCOUNTER — Ambulatory Visit: Payer: Medicare Other | Attending: Radiation Oncology | Admitting: Physical Therapy

## 2015-09-26 DIAGNOSIS — R2232 Localized swelling, mass and lump, left upper limb: Secondary | ICD-10-CM | POA: Diagnosis not present

## 2015-09-26 DIAGNOSIS — M25512 Pain in left shoulder: Secondary | ICD-10-CM | POA: Insufficient documentation

## 2015-09-26 DIAGNOSIS — I890 Lymphedema, not elsewhere classified: Secondary | ICD-10-CM | POA: Diagnosis not present

## 2015-09-26 DIAGNOSIS — M25612 Stiffness of left shoulder, not elsewhere classified: Secondary | ICD-10-CM | POA: Insufficient documentation

## 2015-09-26 NOTE — Patient Instructions (Signed)
Shoulder: Abduction (Supine)    With left arm flat on floor, hold dowel in palm. Slowly move arm up to side of head by pushing with opposite arm. Do not let elbow bend. Hold _5___ seconds. Repeat _10___ times. Do _2___ sessions per day. CAUTION: Stretch slowly and gently.  Copyright  VHI. All rights reserved.  SHOULDER: Flexion - Supine (Cane)    Hold cane in both hands. Raise arms up overhead. Keep elbows straight.  Do not allow back to arch. Hold _5__ seconds. __10_ reps per set, _2__ sets per day, _7__ days per week   Copyright  VHI. All rights reserved.

## 2015-09-26 NOTE — Therapy (Signed)
[place], [place], [address] Phone: [phone]   Fax:  [phone]  Physical Therapy Evaluation  Patient Details  Name: Timothy Bradford MRN: [id number] Date of Birth: 1935-08-03 Referring Provider: Dr. Tammi Klippel  Encounter Date: 09/26/2015      PT End of Session - 09/26/15 1209    Visit Number 1   Number of Visits 17   Date for PT Re-Evaluation 11/14/15   PT Start Time 0955   PT Stop Time 1038   PT Time Calculation (min) 43 min   Activity Tolerance Patient tolerated treatment well   Behavior During Therapy [facility] for tasks assessed/performed      Past Medical History  Diagnosis Date  . Hypertension   . Hyperlipidemia   . Osteoarthritis   . Erectile dysfunction   . Chronic venous insufficiency   . Seborrheic keratoses     Multiple. Sees Lyndle Herrlich  . 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 CHF [id number]    Systolic failure. Cardiac cath in 2004: The LV showed mild global hypokinesia. EF of 45-50%. LVEDP was 12 mmHg. Left main was long which was patent. LAD has 10% distal stenosis. The vessel proximally was very tortuous. Diagonal one was small which was patent. Left circumflex was patent proximally and in mid portion and then tapers down in AV groove after giving off OM-2. OM-1 was large which was p  . Seizures ([place]) 1999    Past Surgical History  Procedure Laterality Date  . Hernia repair    . Skin cancer excision      left shoulder 3 yrs ago  . Eye surgery      bilateral cataracts  . Axillary lymph node dissection Left 08/12/2015  . Cataract extraction w/ intraocular lens  implant, bilateral Bilateral 2016  . Axillary lymph node dissection Left 08/12/2015    Procedure: LEFT AXILLARY LYMPH NODE DISSECTION;  Surgeon: Erroll Luna, MD;  Location: [place];  Service: General;  Laterality: Left;    There were no vitals  filed for this visit.       Subjective Assessment - 09/26/15 1000    Subjective Patient is not sure why he is at this appointment.    Patient is accompained by: Family member   Pertinent History Lymph nodes, regional resection, left axillary, NODULAR MASS OF SQUAMOUS CELL CARCINOMA INVOLVING THE DEEP DERMIS AND SUBCUTANEOUS SOFT tissue, - 5 lymph nodes removed all benign, pt to begin radiation therapy next week   Patient Stated Goals to be able to lift the arm   Currently in Pain? No/denies   Pain Score 0-No pain            [facility] PT Assessment - 09/26/15 0001    Assessment   Medical Diagnosis left axillary cancer squamous cell carcinoma   Referring Provider Dr. Tammi Klippel   Onset Date/Surgical Date 08/12/15   Hand Dominance Right   Prior Therapy none   Precautions   Precautions Other (comment)  lymphedema   Restrictions   Weight Bearing Restrictions No   Balance Screen   Has the patient fallen in the past 6 months No   Has the patient had a decrease in activity level because of a fear of falling?  No   Is the patient reluctant to leave their home because of a fear of falling?  No   Home Environment   Living  Environment Private residence   Living Arrangements Spouse/significant other   Available Help at Discharge Family   Type of [place] Access Level entry   [place] One level   [place] None   Prior Function   Level of [place] On disability   Leisure pt walks 3-4 days a week for 30-40 min   Cognition   Overall Cognitive Status Difficult to assess  Wife answers questions for pt, states pt does not understand   Observation/Other Assessments   Other Surveys  --  LLIS: 24% impairment   ROM / Strength   AROM / PROM / Strength Strength  5/5 in RUE   AROM   Right Shoulder Flexion 161 Degrees   Right Shoulder ABduction 168 Degrees   Right Shoulder Internal Rotation 68 Degrees   Right Shoulder External Rotation 90 Degrees    Left Shoulder Flexion 108 Degrees   Left Shoulder ABduction 98 Degrees   Left Shoulder Internal Rotation 40 Degrees   Left Shoulder External Rotation 86 Degrees           LYMPHEDEMA/ONCOLOGY QUESTIONNAIRE - 09/26/15 1009    Type   Cancer Type left axillary cancer squamous cell carcinoma   Surgeries   Axillary Lymph Node Dissection Date 08/12/15   Number Lymph Nodes Removed 5   Date Lymphedema/Swelling Started   Date 09/09/15   Treatment   Active Chemotherapy Treatment No   Past Chemotherapy Treatment No   Active Radiation Treatment No  to begin next week   Past Radiation Treatment No   What other symptoms do you have   Are you Having Heaviness or Tightness Yes   Are you having Pain No   Are you having pitting edema No   Is it Hard or Difficult finding clothes that fit No   Do you have infections No   Lymphedema Assessments   Lymphedema Assessments Upper extremities   Right Upper Extremity Lymphedema   15 cm Proximal to Olecranon Process 25.5 cm   Olecranon Process 26.4 cm   15 cm Proximal to Ulnar Styloid Process 21.7 cm   10 cm Proximal to Ulnar Styloid Process 18.9 cm   Just Proximal to Ulnar Styloid Process 18 cm   Across Hand at [employer] 22.9 cm   At [place] of 2nd Digit 6.9 cm   Left Upper Extremity Lymphedema   15 cm Proximal to Olecranon Process 27 cm   Olecranon Process 27.5 cm   15 cm Proximal to Ulnar Styloid Process 23.7 cm   10 cm Proximal to Ulnar Styloid Process 20.6 cm   Just Proximal to Ulnar Styloid Process 18 cm   Across Hand at [employer] 21 cm   At [place] of 2nd Digit 6.8 cm           Quick Dash - 09/26/15 0001    Open a tight or new jar No difficulty   Do heavy household chores (wash walls, wash floors) Moderate difficulty   Carry a shopping bag or briefcase Moderate difficulty   Wash your back Severe difficulty   Use a knife to cut food No difficulty   Recreational activities in which you take some force or impact through  your arm, shoulder, or hand (golf, hammering, tennis) Moderate difficulty   During the past week, to what extent has your arm, shoulder or hand problem interfered with your normal social activities with family, friends, neighbors, or groups? Slightly   During the  past week, to what extent has your arm, shoulder or hand problem limited your work or other regular daily activities Slightly   Arm, shoulder, or hand pain. Moderate   Tingling (pins and needles) in your arm, shoulder, or hand None   Difficulty Sleeping No difficulty   DASH Score 29.55 %             [facility] Adult PT Treatment/Exercise - 09/26/15 0001    Shoulder Exercises: Supine   Flexion AAROM;Both;10 reps  with 5 sec holds and verbal cues to keep arm straight   ABduction AAROM;Left;10 reps  with 5 sec holds                PT Education - 09/26/15 1031    Education provided Yes   Education Details supine dowel exercises, lymphedema risk reduction practices   Person(s) Educated Patient;Spouse   Methods Explanation;Demonstration;Handout   Comprehension Verbalized understanding;Returned demonstration;Verbal cues required           Short Term Clinic Goals - 09/26/15 1212    CC Short Term Goal  #1   Title Pt will demonstrate increased left shoulder flexion of 130 degrees to improve ability to reach items on shelf.    Baseline 108   Time 4   Period Weeks   Status New   CC Short Term Goal  #2   Title Pt to demonstrate increased left shoulder abduction of 120 degrees to allow pt to reach items out to sides.   Baseline 98   Time 4   Period Weeks   Status New   CC Short Term Goal  #3   Title Pt and spouse will be independent in verbalizing lymphedema risk reduction practices   Time 4   Period Weeks   Status New             Long Term Clinic Goals - 09/26/15 1213    CC Long Term Goal  #1   Title Pt will demonstrate 160 degrees of left shoulder flexion to allow pt to reach items overhead.    Baseline  108   Time 8   Period Weeks   Status New   CC Long Term Goal  #2   Title Pt will demonstrate 160 degrees of left shoulder abduction to allow pt to reach items out to sides.    Baseline 98   Time 8   Period Weeks   Status New   CC Long Term Goal  #3   Title Pt to be independent in a home exercise program for strengthening and stretching   Time 8   Period Weeks   Status New   CC Long Term Goal  #4   Title Pt will recieve an appropriate compression garment for long term management of edema   Time 8   Period Weeks   Status New   CC Long Term Goal  #5   Title Pt and/or spouse will be independent in self drainage technique for long term management of edema.    Time 8   Period Weeks   Status New            Plan - 09/26/15 1218    Clinical Impression Statement Pt presents to PT for left upper extremity lymphedema and decreased range of motion following surgery for treatment of squamous cell carcinoma in left axilla with 5 lymph nodes removed. Pt is to begin radiation next week. Pt has difficulty with flexion, abduction, internal and external rotation of left upper extremity.  He demonstrates mild edema of his left upper extremity and would benefit from skilled PT services to decrease edema, teach pt and spouse how to manage edema and increase left shoulder ROM.    Rehab Potential Good   Clinical Impairments Affecting Rehab Potential pt's wife states he has poor memory, have pt's wife in room to educate   PT Frequency 2x / week   PT Duration 8 weeks   PT Treatment/Interventions Passive range of motion;Taping;Manual techniques;Manual lymph drainage;Vasopneumatic Device;Therapeutic exercise;Therapeutic activities;Patient/family education;DME Instruction;Scar mobilization;Compression bandaging;ADLs/Self Care Home Management   PT Next Visit Plan teach breast cancer ABC class exercises, PROM/AAROM/AROM to L shoulder, begin process of getting compression sleeve for LUE and MLD to LUE if time  allows   PT Home Exercise Plan supine dowel ROM exercises   Consulted and Agree with Plan of Care Patient;Family member/caregiver      Patient will benefit from skilled therapeutic intervention in order to improve the following deficits and impairments:  Decreased range of motion, Decreased strength, Increased fascial restricitons, Impaired UE functional use, Pain, Decreased knowledge of precautions, Decreased knowledge of use of DME  Visit Diagnosis: Lymphedema, not elsewhere classified - Plan: PT plan of care cert/re-cert  Stiffness of left shoulder, not elsewhere classified - Plan: PT plan of care cert/re-cert  Pain in left shoulder - Plan: PT plan of care cert/re-cert      G-Codes - 0000000 1216    Functional Assessment Tool Used quick dash   Functional Limitation Carrying, moving and handling objects   Carrying, Moving and Handling Objects Current Status [id number]) At least 20 percent but less than 40 percent impaired, limited or restricted   Carrying, Moving and Handling Objects Goal Status [id number]) At least 1 percent but less than 20 percent impaired, limited or restricted       Problem List Patient Active Problem List   Diagnosis Date Noted  . Axillary mass 08/12/2015  . Metastatic squamous cell carcinoma ([place]) 06/20/2015  . Mass of left axilla 05/22/2015  . Encounter for immunization 12/27/2014  . Chronic venous insufficiency   . H/O hyperglycemia 04/10/2014  . Gout 07/25/2013  . Anemia 07/25/2013  . Swelling of joint, wrist, left 07/13/2013  . Seborrheic keratoses, inflamed 10/26/2012  . Health care maintenance 10/26/2012  . Chronic diastolic congestive heart failure ([place]) 12/26/2008  . OSTEOARTHRITIS 05/27/2006  . Dyslipidemia 04/26/2006  . ERECTILE DYSFUNCTION 02/09/2006  . Essential hypertension 02/09/2006    Alexia Freestone 09/26/2015, 12:25 PM  [place], [place],  [address] Phone: [phone]   Fax:  [phone]  Name: Timothy Bradford MRN: [id number] Date of Birth: [birth year]   Allyson Sabal, PT 09/26/2015 12:25 PM

## 2015-09-30 ENCOUNTER — Encounter: Payer: Self-pay | Admitting: Radiation Oncology

## 2015-09-30 ENCOUNTER — Ambulatory Visit
Admission: RE | Admit: 2015-09-30 | Discharge: 2015-09-30 | Disposition: A | Discharge status: Home / Self Care | Payer: Medicare Other | Source: Ambulatory Visit | Attending: Radiation Oncology | Admitting: Radiation Oncology

## 2015-09-30 DIAGNOSIS — Z510 Encounter for antineoplastic radiation therapy: Secondary | ICD-10-CM | POA: Diagnosis not present

## 2015-09-30 NOTE — Progress Notes (Signed)
Financial Counseling--Patient and wife brought in income verifications-approved for [place] [phone] grant--entered into sharepoint

## 2015-10-01 ENCOUNTER — Ambulatory Visit
Admission: RE | Admit: 2015-10-01 | Discharge: 2015-10-01 | Disposition: A | Discharge status: Home / Self Care | Payer: Medicare Other | Source: Ambulatory Visit

## 2015-10-01 ENCOUNTER — Ambulatory Visit: Payer: Medicare Other | Admitting: Physical Therapy

## 2015-10-01 DIAGNOSIS — M25512 Pain in left shoulder: Secondary | ICD-10-CM

## 2015-10-01 DIAGNOSIS — M25612 Stiffness of left shoulder, not elsewhere classified: Secondary | ICD-10-CM

## 2015-10-01 DIAGNOSIS — Z510 Encounter for antineoplastic radiation therapy: Secondary | ICD-10-CM | POA: Diagnosis not present

## 2015-10-01 DIAGNOSIS — I890 Lymphedema, not elsewhere classified: Secondary | ICD-10-CM | POA: Diagnosis not present

## 2015-10-01 NOTE — Therapy (Signed)
[place], [place], [address] Phone: [phone]   Fax:  [phone]  Physical Therapy Treatment  Patient Details  Name: Timothy Bradford MRN: [id number] Date of Birth: 1935/04/27 Referring Provider: Dr. Tammi Klippel  Encounter Date: 10/01/2015      PT End of Session - 10/01/15 1649    Visit Number 2   Number of Visits 17   Date for PT Re-Evaluation 11/14/15   PT Start Time [id number]   PT Stop Time 1346   PT Time Calculation (min) 43 min   Activity Tolerance Patient tolerated treatment well   Behavior During Therapy [facility] for tasks assessed/performed      Past Medical History  Diagnosis Date  . Hypertension   . Hyperlipidemia   . Osteoarthritis   . Erectile dysfunction   . Chronic venous insufficiency   . Seborrheic keratoses     Multiple. Sees Lyndle Herrlich  . 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 CHF [id number]    Systolic failure. Cardiac cath in 2004: The LV showed mild global hypokinesia. EF of 45-50%. LVEDP was 12 mmHg. Left main was long which was patent. LAD has 10% distal stenosis. The vessel proximally was very tortuous. Diagonal one was small which was patent. Left circumflex was patent proximally and in mid portion and then tapers down in AV groove after giving off OM-2. OM-1 was large which was p  . Seizures ([place]) 1999    Past Surgical History  Procedure Laterality Date  . Hernia repair    . Skin cancer excision      left shoulder 3 yrs ago  . Eye surgery      bilateral cataracts  . Axillary lymph node dissection Left 08/12/2015  . Cataract extraction w/ intraocular lens  implant, bilateral Bilateral 2016  . Axillary lymph node dissection Left 08/12/2015    Procedure: LEFT AXILLARY LYMPH NODE DISSECTION;  Surgeon: Erroll Luna, MD;  Location: [facility];  Service: General;  Laterality: Left;    There were no vitals filed  for this visit.      Subjective Assessment - 10/01/15 1306    Subjective "Tried the exercises and it's going pretty good.  I can move my arm better."   Currently in Pain? No/denies            [facility] PT Assessment - 10/01/15 0001    AROM   Left Shoulder Flexion 115 Degrees   Left Shoulder ABduction 99 Degrees                     [facility] Adult PT Treatment/Exercise - 10/01/15 0001    Self-Care   Self-Care Other Self-Care Comments   Other Self-Care Comments  Discussed how and where to get a compression sleeve (suggested 20-30 mmHg [employer]) with patient and his wife.   Shoulder Exercises: Supine   Flexion AAROM;Both;5 reps  with dowel, 5 sec holds and verbal cues to keep arm straight   ABduction AAROM;Left;10 reps  with dowel, 5 sec holds (needed more cueing here)   Manual Therapy   Manual Therapy Passive ROM;Manual Lymphatic Drainage (MLD);Edema management;Myofascial release   Edema Management Instructed patient in diaphragmatic breathing and explained its purpose for manual lymph drainage.   Myofascial Release Left UE myofascial pulling, moving arm into abduction through partial (comfortable) range   Passive ROM In supine to left shoulder  for ER, abduction, and flexion                PT Education - 10/01/15 1648    Education provided Yes   Education Details about compression sleeves, where and how to obtain, what the goal is of using one   Person(s) Educated Patient;Spouse   Methods Explanation;Handout   Comprehension Verbalized understanding           Short Term Clinic Goals - 10/01/15 1652    CC Short Term Goal  #1   Title Pt will demonstrate increased left shoulder flexion of 130 degrees to improve ability to reach items on shelf.    Status On-going   CC Short Term Goal  #2   Title Pt to demonstrate increased left shoulder abduction of 120 degrees to allow pt to reach items out to sides.   Status On-going   CC Short Term Goal  #3    Title Pt and spouse will be independent in verbalizing lymphedema risk reduction practices   Status On-going             Long Term Clinic Goals - 09/26/15 1213    CC Long Term Goal  #1   Title Pt will demonstrate 160 degrees of left shoulder flexion to allow pt to reach items overhead.    Baseline 108   Time 8   Period Weeks   Status New   CC Long Term Goal  #2   Title Pt will demonstrate 160 degrees of left shoulder abduction to allow pt to reach items out to sides.    Baseline 98   Time 8   Period Weeks   Status New   CC Long Term Goal  #3   Title Pt to be independent in a home exercise program for strengthening and stretching   Time 8   Period Weeks   Status New   CC Long Term Goal  #4   Title Pt will recieve an appropriate compression garment for long term management of edema   Time 8   Period Weeks   Status New   CC Long Term Goal  #5   Title Pt and/or spouse will be independent in self drainage technique for long term management of edema.    Time 8   Period Weeks   Status New            Plan - 10/01/15 1649    Clinical Impression Statement Patient gained a few degrees of left shoulder flexion since initial evaluation.  Home exercise program was reviewed today and patient and his wife were educated about where and how to obtain a compression sleeve.   Rehab Potential Good   Clinical Impairments Affecting Rehab Potential pt's wife states he has poor memory, have pt's wife in room to educate   PT Frequency 2x / week   PT Duration 8 weeks   PT Treatment/Interventions Passive range of motion;Taping;Manual techniques;Manual lymph drainage;Vasopneumatic Device;Therapeutic exercise;Therapeutic activities;Patient/family education;DME Instruction;Scar mobilization;Compression bandaging;ADLs/Self Care Home Management   PT Next Visit Plan teach breast cancer ABC class exercises, PROM/AAROM/AROM to Lt shoulder, and MLD to Lt UE if time allows   PT Home Exercise Plan  supine dowel ROM exercises   Consulted and Agree with Plan of Care Patient      Patient will benefit from skilled therapeutic intervention in order to improve the following deficits and impairments:  Decreased range of motion, Decreased strength, Increased fascial restricitons, Impaired UE functional use, Pain, Decreased knowledge of  precautions, Decreased knowledge of use of DME  Visit Diagnosis: Lymphedema, not elsewhere classified  Stiffness of left shoulder, not elsewhere classified  Pain in left shoulder     Problem List Patient Active Problem List   Diagnosis Date Noted  . Axillary mass 08/12/2015  . Metastatic squamous cell carcinoma ([place]) 06/20/2015  . Mass of left axilla 05/22/2015  . Encounter for immunization 12/27/2014  . Chronic venous insufficiency   . H/O hyperglycemia 04/10/2014  . Gout 07/25/2013  . Anemia 07/25/2013  . Swelling of joint, wrist, left 07/13/2013  . Seborrheic keratoses, inflamed 10/26/2012  . Health care maintenance 10/26/2012  . Chronic diastolic congestive heart failure ([place]) 12/26/2008  . OSTEOARTHRITIS 05/27/2006  . Dyslipidemia 04/26/2006  . ERECTILE DYSFUNCTION 02/09/2006  . Essential hypertension 02/09/2006    Matha Masse 10/01/2015, 4:53 PM  [place], [place], [address] Phone: [phone]   Fax:  [phone]  Name: Xzayvian Macnaughton MRN: [id number] Date of Birth: [birth year]    Serafina Royals, PT 10/01/2015 4:53 PM

## 2015-10-02 ENCOUNTER — Ambulatory Visit
Admission: RE | Admit: 2015-10-02 | Discharge: 2015-10-02 | Disposition: A | Discharge status: Home / Self Care | Payer: Medicare Other | Source: Ambulatory Visit

## 2015-10-02 ENCOUNTER — Ambulatory Visit
Admission: RE | Admit: 2015-10-02 | Discharge: 2015-10-02 | Disposition: A | Discharge status: Home / Self Care | Payer: Medicare Other | Source: Ambulatory Visit | Attending: Radiation Oncology | Admitting: Radiation Oncology

## 2015-10-02 ENCOUNTER — Encounter: Payer: Self-pay | Admitting: Radiation Oncology

## 2015-10-02 VITALS — BP 150/86 | HR 63 | Temp 97.7°F | Resp 18 | Ht 74.0 in | Wt 188.8 lb

## 2015-10-02 DIAGNOSIS — R2232 Localized swelling, mass and lump, left upper limb: Secondary | ICD-10-CM

## 2015-10-02 DIAGNOSIS — Z923 Personal history of irradiation: Secondary | ICD-10-CM | POA: Insufficient documentation

## 2015-10-02 DIAGNOSIS — I890 Lymphedema, not elsewhere classified: Secondary | ICD-10-CM | POA: Diagnosis not present

## 2015-10-02 DIAGNOSIS — Z510 Encounter for antineoplastic radiation therapy: Secondary | ICD-10-CM | POA: Diagnosis not present

## 2015-10-02 DIAGNOSIS — C792 Secondary malignant neoplasm of skin: Secondary | ICD-10-CM | POA: Insufficient documentation

## 2015-10-02 DIAGNOSIS — IMO0002 Reserved for concepts with insufficient information to code with codable children: Secondary | ICD-10-CM

## 2015-10-02 DIAGNOSIS — C799 Secondary malignant neoplasm of unspecified site: Secondary | ICD-10-CM

## 2015-10-02 MED ORDER — ALRA NON-METALLIC DEODORANT (RAD-ONC)
1.0000 "application " | Freq: Once | TOPICAL | Status: AC
  Administered 2015-10-02: 1 via TOPICAL

## 2015-10-02 MED ORDER — RADIAPLEXRX EX GEL
Freq: Once | CUTANEOUS | Status: AC
  Administered 2015-10-02: 17:00:00 via TOPICAL

## 2015-10-02 NOTE — Addendum Note (Signed)
Encounter addended by: Heywood Footman, RN on: 10/02/2015  4:10 PM<BR>     Documentation filed: Chief Complaint Section, Medications, Visit Diagnoses, Dx Association, Notes Section, Inpatient Patient Education, Orders

## 2015-10-02 NOTE — Progress Notes (Addendum)
Weight and vitals stable. Denies pain. Left axilla without hyperpigmentation or desquamation. Transverse left axillary incision well approximated without redness, drainage or edema. Mild lymphedema noted in left arm but, patient is undergoing PT. Denies fatigue. Oriented patient and his wife to staff and routine of the clinic. Provided patient with RADIATION THERAPY AND YOU handbook then, reviewed pertinent information. Educated patient reference potential side effects and management such as, fatigue and skin changes. Provided patient with radiaplex and alra then, directed upon use. Provided patient with my business card and encouraged him to call with needs. Answered all patient questions to the best of my ability. Patient verbalized understanding of all reviewed.   BP 150/86 mmHg  Pulse 63  Temp(Src) 97.7 F (36.5 C) (Oral)  Resp 18  Ht 6\' 2"  (1.88 m)  Wt 188 lb 12.8 oz (85.639 kg)  BMI 24.23 kg/m2  SpO2 100% Wt Readings from Last 3 Encounters:  10/02/15 188 lb 12.8 oz (85.639 kg)  09/17/15 192 lb 6.4 oz (87.272 kg)  08/12/15 183 lb (83.008 kg)

## 2015-10-02 NOTE — Addendum Note (Signed)
Encounter addended by: Heywood Footman, RN on: 10/02/2015  4:53 PM<BR>     Documentation filed: Inpatient MAR

## 2015-10-02 NOTE — Progress Notes (Signed)
  Radiation Oncology         [phone]   Name: Timothy Bradford MRN: [id number]   Date: 10/02/2015  DOB: 1935/12/10   Weekly Radiation Therapy Management  Metastatic squamous cell carcinoma in left axillary.   Current Dose: 4 Gy  Planned Dose:  60 Gy  Narrative The patient presents for routine under treatment assessment. Weight and vitals stable. Denies pain. Left axilla without hyperpigmentation or desquamation. Transverse left axillary incision well approximated without redness, drainage or edema. Mild lymphedema noted in left arm but, patient is undergoing PT. Denies fatigue. .  The patient is without complaint. Set-up films were reviewed. The chart was checked.  Physical Findings  height is 6\' 2"  (1.88 m) and weight is 188 lb 12.8 oz (85.639 kg). His oral temperature is 97.7 F (36.5 C). His blood pressure is 150/86 and his pulse is 63. His respiration is 18 and oxygen saturation is 100%. .  Weight and vitals stable. Denies pain. Left axilla without hyperpigmentation or desquamation. Transverse left axillary incision well approximated without redness, drainage or edema. Mild lymphedema noted in left arm but, patient is undergoing PT. Denies fatigue.    Impression The patient is tolerating radiation.  Plan Continue treatment as planned.   Sheral Apley Tammi Klippel, M.D.  This document serves as a record of services personally performed by Tyler Pita, MD. It was created on his behalf by Truddie Hidden, a trained medical scribe. The creation of this record is based on the scribe's personal observations and the provider's statements to them. This document has been checked and approved by the attending provider.

## 2015-10-03 ENCOUNTER — Ambulatory Visit: Payer: Medicare Other | Admitting: Physical Therapy

## 2015-10-03 ENCOUNTER — Encounter: Payer: Self-pay | Admitting: Physical Therapy

## 2015-10-03 ENCOUNTER — Ambulatory Visit
Admission: RE | Admit: 2015-10-03 | Discharge: 2015-10-03 | Disposition: A | Discharge status: Home / Self Care | Payer: Medicare Other | Source: Ambulatory Visit

## 2015-10-03 DIAGNOSIS — I890 Lymphedema, not elsewhere classified: Secondary | ICD-10-CM

## 2015-10-03 DIAGNOSIS — M25512 Pain in left shoulder: Secondary | ICD-10-CM

## 2015-10-03 DIAGNOSIS — M25612 Stiffness of left shoulder, not elsewhere classified: Secondary | ICD-10-CM

## 2015-10-03 DIAGNOSIS — Z510 Encounter for antineoplastic radiation therapy: Secondary | ICD-10-CM | POA: Diagnosis not present

## 2015-10-03 NOTE — Therapy (Signed)
[place], [place], [address] Phone: [phone]   Fax:  [phone]  Physical Therapy Treatment  Patient Details  Name: Timothy Bradford MRN: [id number] Date of Birth: 01/15/1936 Referring Provider: Dr. Tammi Klippel  Encounter Date: 10/03/2015      PT End of Session - 10/03/15 1212    Visit Number 3   Number of Visits 17   Date for PT Re-Evaluation 11/14/15   PT Start Time 0848   PT Stop Time 0930   PT Time Calculation (min) 42 min   Activity Tolerance Patient tolerated treatment well   Behavior During Therapy [facility] for tasks assessed/performed      Past Medical History  Diagnosis Date  . Hypertension   . Hyperlipidemia   . Osteoarthritis   . Erectile dysfunction   . Chronic venous insufficiency   . Seborrheic keratoses     Multiple. Sees Lyndle Herrlich  . 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 CHF 4128    Systolic failure. Cardiac cath in 2004: The LV showed mild global hypokinesia. EF of 45-50%. LVEDP was 12 mmHg. Left main was long which was patent. LAD has 10% distal stenosis. The vessel proximally was very tortuous. Diagonal one was small which was patent. Left circumflex was patent proximally and in mid portion and then tapers down in AV groove after giving off OM-2. OM-1 was large which was p  . Seizures ([place]) 1999    Past Surgical History  Procedure Laterality Date  . Hernia repair    . Skin cancer excision      left shoulder 3 yrs ago  . Eye surgery      bilateral cataracts  . Axillary lymph node dissection Left 08/12/2015  . Cataract extraction w/ intraocular lens  implant, bilateral Bilateral 2016  . Axillary lymph node dissection Left 08/12/2015    Procedure: LEFT AXILLARY LYMPH NODE DISSECTION;  Surgeon: Erroll Luna, MD;  Location: [employer] [facility];  Service: General;  Laterality: Left;    There were no vitals filed  for this visit.      Subjective Assessment - 10/03/15 0849    Subjective The exercises are going pretty good.    Pertinent History Lymph nodes, regional resection, left axillary, NODULAR MASS OF SQUAMOUS CELL CARCINOMA INVOLVING THE DEEP DERMIS AND SUBCUTANEOUS SOFT tissue, - 5 lymph nodes removed all benign, pt to begin radiation therapy next week   Patient Stated Goals to be able to lift the arm   Currently in Pain? No/denies   Pain Score 0-No pain            [facility] PT Assessment - 10/03/15 0001    AROM   Left Shoulder Flexion 140 Degrees   Left Shoulder ABduction 120 Degrees                     [facility] Adult PT Treatment/Exercise - 10/03/15 0001    Shoulder Exercises: Supine   Flexion AAROM;Both;10 reps  with dowel, 5 sec holds    ABduction AAROM;Left;10 reps  with dowel, 5 sec holds (needed more cueing here)   Shoulder Exercises: Standing   Other Standing Exercises Finger ladder x 10 reps in flexion and abduction   Shoulder Exercises: Pulleys   Flexion 2 minutes   ABduction 2 minutes   Shoulder Exercises: Therapy Ball   Flexion 10 reps  with stretch at end  ABduction 10 reps  with stretch at end range   Manual Therapy   Manual Therapy Passive ROM;Myofascial release   Myofascial Release Left UE myofascial pulling, moving arm into abduction through partial (comfortable) range   Passive ROM In supine to left shoulder for ER, abduction, and flexion                   Short Term Clinic Goals - 10/03/15 1638    CC Short Term Goal  #1   Title Pt will demonstrate increased left shoulder flexion of 130 degrees to improve ability to reach items on shelf.    Baseline 108, 10/03/15- 140 degrees   Time 4   Period Weeks   Status Achieved   CC Short Term Goal  #2   Title Pt to demonstrate increased left shoulder abduction of 120 degrees to allow pt to reach items out to sides.   Baseline 98, 10/03/15 - 120 degrees   Time 4   Period Weeks   Status  Achieved   CC Short Term Goal  #3   Title Pt and spouse will be independent in verbalizing lymphedema risk reduction practices   Time 4   Period Weeks   Status On-going             Long Term Clinic Goals - 09/26/15 1213    CC Long Term Goal  #1   Title Pt will demonstrate 160 degrees of left shoulder flexion to allow pt to reach items overhead.    Baseline 108   Time 8   Period Weeks   Status New   CC Long Term Goal  #2   Title Pt will demonstrate 160 degrees of left shoulder abduction to allow pt to reach items out to sides.    Baseline 98   Time 8   Period Weeks   Status New   CC Long Term Goal  #3   Title Pt to be independent in a home exercise program for strengthening and stretching   Time 8   Period Weeks   Status New   CC Long Term Goal  #4   Title Pt will recieve an appropriate compression garment for long term management of edema   Time 8   Period Weeks   Status New   CC Long Term Goal  #5   Title Pt and/or spouse will be independent in self drainage technique for long term management of edema.    Time 8   Period Weeks   Status New            Plan - 10/03/15 1213    Clinical Impression Statement Pt has made great progress towards goals in therapy today. He met his short term range of motion goals. He completed his exercises today with less cueing and did not have any pain.    Rehab Potential Good   Clinical Impairments Affecting Rehab Potential pt's wife states he has poor memory, have pt's wife in room to educate   PT Frequency 2x / week   PT Duration 8 weeks   PT Treatment/Interventions Passive range of motion;Taping;Manual techniques;Manual lymph drainage;Vasopneumatic Device;Therapeutic exercise;Therapeutic activities;Patient/family education;DME Instruction;Scar mobilization;Compression bandaging;ADLs/Self Care Home Management   PT Next Visit Plan teach breast cancer ABC class exercises, PROM/AAROM/AROM to Lt shoulder, and MLD to Lt UE if time  allows   PT Home Exercise Plan supine dowel ROM exercises   Consulted and Agree with Plan of Care Patient      Patient will  benefit from skilled therapeutic intervention in order to improve the following deficits and impairments:  Decreased range of motion, Decreased strength, Increased fascial restricitons, Impaired UE functional use, Pain, Decreased knowledge of precautions, Decreased knowledge of use of DME  Visit Diagnosis: Lymphedema, not elsewhere classified  Stiffness of left shoulder, not elsewhere classified  Pain in left shoulder     Problem List Patient Active Problem List   Diagnosis Date Noted  . Axillary mass 08/12/2015  . Metastatic squamous cell carcinoma ([place]) 06/20/2015  . Mass of left axilla 05/22/2015  . Encounter for immunization 12/27/2014  . Chronic venous insufficiency   . H/O hyperglycemia 04/10/2014  . Gout 07/25/2013  . Anemia 07/25/2013  . Swelling of joint, wrist, left 07/13/2013  . Seborrheic keratoses, inflamed 10/26/2012  . Health care maintenance 10/26/2012  . Chronic diastolic congestive heart failure ([place]) 12/26/2008  . OSTEOARTHRITIS 05/27/2006  . Dyslipidemia 04/26/2006  . ERECTILE DYSFUNCTION 02/09/2006  . Essential hypertension 02/09/2006    Alexia Freestone 10/03/2015, 12:15 PM  [place], [place], [address] Phone: [phone]   Fax:  [phone]  Name: Timothy Bradford MRN: [id number] Date of Birth: 1935-05-29    Allyson Sabal, PT 10/03/2015 12:15 PM

## 2015-10-06 ENCOUNTER — Ambulatory Visit
Admission: RE | Admit: 2015-10-06 | Discharge: 2015-10-06 | Disposition: A | Discharge status: Home / Self Care | Payer: Medicare Other | Source: Ambulatory Visit

## 2015-10-06 DIAGNOSIS — Z510 Encounter for antineoplastic radiation therapy: Secondary | ICD-10-CM | POA: Diagnosis not present

## 2015-10-08 ENCOUNTER — Ambulatory Visit: Payer: Medicare Other | Attending: Radiation Oncology | Admitting: Physical Therapy

## 2015-10-08 ENCOUNTER — Ambulatory Visit
Admission: RE | Admit: 2015-10-08 | Discharge: 2015-10-08 | Disposition: A | Discharge status: Home / Self Care | Payer: Medicare Other | Source: Ambulatory Visit

## 2015-10-08 DIAGNOSIS — M25512 Pain in left shoulder: Secondary | ICD-10-CM | POA: Diagnosis present

## 2015-10-08 DIAGNOSIS — M25612 Stiffness of left shoulder, not elsewhere classified: Secondary | ICD-10-CM | POA: Insufficient documentation

## 2015-10-08 DIAGNOSIS — I890 Lymphedema, not elsewhere classified: Secondary | ICD-10-CM | POA: Insufficient documentation

## 2015-10-08 DIAGNOSIS — Z510 Encounter for antineoplastic radiation therapy: Secondary | ICD-10-CM | POA: Diagnosis not present

## 2015-10-08 NOTE — Therapy (Signed)
[place], [place], [address] Phone: [phone]   Fax:  [phone]  Physical Therapy Treatment  Patient Details  Name: Timothy Bradford MRN: [id number] Date of Birth: October 20, 1935 Referring Provider: Dr. Tammi Klippel  Encounter Date: 10/08/2015      PT End of Session - 10/08/15 1039    Visit Number 4   Number of Visits 17   Date for PT Re-Evaluation 11/14/15   PT Start Time 0844   PT Stop Time 0929   PT Time Calculation (min) 45 min   Activity Tolerance Patient tolerated treatment well   Behavior During Therapy [facility] for tasks assessed/performed      Past Medical History  Diagnosis Date  . Hypertension   . Hyperlipidemia   . Osteoarthritis   . Erectile dysfunction   . Chronic venous insufficiency   . Seborrheic keratoses     Multiple. Sees Lyndle Herrlich  . 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 CHF 123XX123    Systolic failure. Cardiac cath in 2004: The LV showed mild global hypokinesia. EF of 45-50%. LVEDP was 12 mmHg. Left main was long which was patent. LAD has 10% distal stenosis. The vessel proximally was very tortuous. Diagonal one was small which was patent. Left circumflex was patent proximally and in mid portion and then tapers down in AV groove after giving off OM-2. OM-1 was large which was p  . Seizures ([place]) 1999    Past Surgical History  Procedure Laterality Date  . Hernia repair    . Skin cancer excision      left shoulder 3 yrs ago  . Eye surgery      bilateral cataracts  . Axillary lymph node dissection Left 08/12/2015  . Cataract extraction w/ intraocular lens  implant, bilateral Bilateral 2016  . Axillary lymph node dissection Left 08/12/2015    Procedure: LEFT AXILLARY LYMPH NODE DISSECTION;  Surgeon: Erroll Luna, MD;  Location: [facility];  Service: General;  Laterality: Left;    There were no vitals filed  for this visit.      Subjective Assessment - 10/08/15 0847    Subjective Nothing new.  Hasn't noticed any change.   Currently in Pain? Yes   Pain Score 1    Pain Location Axilla   Pain Orientation Left   Pain Descriptors / Indicators Sore   Aggravating Factors  lift something too heavy   Pain Relieving Factors keep it moving               LYMPHEDEMA/ONCOLOGY QUESTIONNAIRE - 10/08/15 0907    Left Upper Extremity Lymphedema   15 cm Proximal to Olecranon Process 27.5 cm   Olecranon Process 27.3 cm   15 cm Proximal to Ulnar Styloid Process 23.7 cm   10 cm Proximal to Ulnar Styloid Process 20.3 cm   Just Proximal to Ulnar Styloid Process 18.1 cm   Across Hand at [employer] 21 cm   At [place] of 2nd Digit 6.8 cm                  [facility] Adult PT Treatment/Exercise - 10/08/15 0001    Self-Care   Other Self-Care Comments  Spoke to patient's wife about compression sleeve; she said her granddaughter is helping them set up and appointment for fitting, but she hadn't been able to get through on Monday, so will try again.  Shoulder Exercises: Standing   Other Standing Exercises Instructed patient in ABC class shoulder stretches:  bilat. flexion with dowel, left abduction with dowel, doorway ER stretch, left abduction with right sidebend, bilat. shoulder ER with scapular retraction actively, left upper trap stretch, backward shoulder rolls, all done for 5 count hold, 5 reps.   Shoulder Exercises: Pulleys   Flexion 2 minutes   ABduction 2 minutes   Shoulder Exercises: Therapy Ball   Flexion 10 reps  with stretch at end   ABduction 10 reps  with stretch at end range   Manual Therapy   Manual Therapy Manual Lymphatic Drainage (MLD)   Edema Management circumference measurements taken of left arm.   Manual Lymphatic Drainage (MLD) Instructed patient in, and he performed, diaphragmatic breathing.  Then, short neck, right axilla and anterior interaxillary anastomosis, left  groin and axillo-inguinal anastomosis, and left UE from dorsal hand to shoulder, all in supine.  Verbal explanation given for manual lymph drainage as it was done.                PT Education - 10/08/15 0906    Education provided Yes   Education Details ABC class stretching exercises   Person(s) Educated Patient   Methods Explanation;Handout;Demonstration;Verbal cues   Comprehension Verbalized understanding;Returned demonstration           Short Term Clinic Goals - 10/03/15 0927    CC Short Term Goal  #1   Title Pt will demonstrate increased left shoulder flexion of 130 degrees to improve ability to reach items on shelf.    Baseline 108, 10/03/15- 140 degrees   Time 4   Period Weeks   Status Achieved   CC Short Term Goal  #2   Title Pt to demonstrate increased left shoulder abduction of 120 degrees to allow pt to reach items out to sides.   Baseline 98, 10/03/15 - 120 degrees   Time 4   Period Weeks   Status Achieved   CC Short Term Goal  #3   Title Pt and spouse will be independent in verbalizing lymphedema risk reduction practices   Time 4   Period Weeks   Status On-going             Long Term Clinic Goals - 09/26/15 1213    CC Long Term Goal  #1   Title Pt will demonstrate 160 degrees of left shoulder flexion to allow pt to reach items overhead.    Baseline 108   Time 8   Period Weeks   Status New   CC Long Term Goal  #2   Title Pt will demonstrate 160 degrees of left shoulder abduction to allow pt to reach items out to sides.    Baseline 98   Time 8   Period Weeks   Status New   CC Long Term Goal  #3   Title Pt to be independent in a home exercise program for strengthening and stretching   Time 8   Period Weeks   Status New   CC Long Term Goal  #4   Title Pt will recieve an appropriate compression garment for long term management of edema   Time 8   Period Weeks   Status New   CC Long Term Goal  #5   Title Pt and/or spouse will be  independent in self drainage technique for long term management of edema.    Time 8   Period Weeks   Status New  Plan - 10/08/15 1040    Clinical Impression Statement Patient did well with more exercise.  He reports doing his HEP and was given additional exercises to add to that today; he seemd enthusiastic about these.  Began manual lymph drainage today.  Circumference measurements of left arm had changed little except for a 0.5 cm. increase at upper arm today.  He has not gotten a compression sleeve yet.   Rehab Potential Good   Clinical Impairments Affecting Rehab Potential pt's wife states he has poor memory, have pt's wife in room to educate   PT Frequency 2x / week   PT Duration 8 weeks   PT Treatment/Interventions Passive range of motion;Taping;Manual techniques;Manual lymph drainage;Vasopneumatic Device;Therapeutic exercise;Therapeutic activities;Patient/family education;DME Instruction;Scar mobilization;Compression bandaging;ADLs/Self Care Home Management   PT Next Visit Plan PROM/AAROM/AROM to Lt shoulder, and MLD to Lt UE   PT Home Exercise Plan supine dowel ROM exercises, ABC class stretching exercises   Consulted and Agree with Plan of Care Patient      Patient will benefit from skilled therapeutic intervention in order to improve the following deficits and impairments:  Decreased range of motion, Decreased strength, Increased fascial restricitons, Impaired UE functional use, Pain, Decreased knowledge of precautions, Decreased knowledge of use of DME  Visit Diagnosis: Stiffness of left shoulder, not elsewhere classified  Pain in left shoulder  Lymphedema, not elsewhere classified     Problem List Patient Active Problem List   Diagnosis Date Noted  . Axillary mass 08/12/2015  . Metastatic squamous cell carcinoma ([place]) 06/20/2015  . Mass of left axilla 05/22/2015  . Encounter for immunization 12/27/2014  . Chronic venous insufficiency   . H/O  hyperglycemia 04/10/2014  . Gout 07/25/2013  . Anemia 07/25/2013  . Swelling of joint, wrist, left 07/13/2013  . Seborrheic keratoses, inflamed 10/26/2012  . Health care maintenance 10/26/2012  . Chronic diastolic congestive heart failure ([place]) 12/26/2008  . OSTEOARTHRITIS 05/27/2006  . Dyslipidemia 04/26/2006  . ERECTILE DYSFUNCTION 02/09/2006  . Essential hypertension 02/09/2006    Timothy Bradford 10/08/2015, 10:44 AM  [place], [place], [address] Phone: [phone]   Fax:  [phone]  Name: Timothy Bradford MRN: [id number] Date of Birth: 09-20-1935    Timothy Bradford, PT 10/08/2015 10:44 AM

## 2015-10-09 ENCOUNTER — Ambulatory Visit
Admission: RE | Admit: 2015-10-09 | Discharge: 2015-10-09 | Disposition: A | Discharge status: Home / Self Care | Payer: Medicare Other | Source: Ambulatory Visit

## 2015-10-09 DIAGNOSIS — Z510 Encounter for antineoplastic radiation therapy: Secondary | ICD-10-CM | POA: Diagnosis not present

## 2015-10-10 ENCOUNTER — Encounter: Payer: Self-pay | Admitting: Physical Therapy

## 2015-10-10 ENCOUNTER — Encounter: Payer: Self-pay | Admitting: Radiation Oncology

## 2015-10-10 ENCOUNTER — Ambulatory Visit
Admission: RE | Admit: 2015-10-10 | Discharge: 2015-10-10 | Disposition: A | Discharge status: Home / Self Care | Payer: Medicare Other | Source: Ambulatory Visit

## 2015-10-10 ENCOUNTER — Ambulatory Visit
Admission: RE | Admit: 2015-10-10 | Discharge: 2015-10-10 | Disposition: A | Discharge status: Home / Self Care | Payer: Medicare Other | Source: Ambulatory Visit | Attending: Radiation Oncology | Admitting: Radiation Oncology

## 2015-10-10 ENCOUNTER — Ambulatory Visit: Payer: Medicare Other | Admitting: Physical Therapy

## 2015-10-10 VITALS — BP 151/78 | HR 67 | Resp 16 | Wt 191.3 lb

## 2015-10-10 DIAGNOSIS — I890 Lymphedema, not elsewhere classified: Secondary | ICD-10-CM

## 2015-10-10 DIAGNOSIS — IMO0002 Reserved for concepts with insufficient information to code with codable children: Secondary | ICD-10-CM

## 2015-10-10 DIAGNOSIS — M25512 Pain in left shoulder: Secondary | ICD-10-CM

## 2015-10-10 DIAGNOSIS — Z510 Encounter for antineoplastic radiation therapy: Secondary | ICD-10-CM | POA: Diagnosis not present

## 2015-10-10 DIAGNOSIS — M25612 Stiffness of left shoulder, not elsewhere classified: Secondary | ICD-10-CM

## 2015-10-10 DIAGNOSIS — C799 Secondary malignant neoplasm of unspecified site: Secondary | ICD-10-CM

## 2015-10-10 NOTE — Therapy (Signed)
[place], [place], [address] Phone: [phone]   Fax:  [phone]  Physical Therapy Treatment  Patient Details  Name: Timothy Bradford MRN: [id number] Date of Birth: Sep 19, 1935 Referring Provider: Dr. Tammi Klippel  Encounter Date: 10/10/2015      PT End of Session - 10/10/15 0925    Visit Number 5   Number of Visits 17   Date for PT Re-Evaluation 11/14/15   PT Start Time 0839   PT Stop Time 0923   PT Time Calculation (min) 44 min   Activity Tolerance Patient tolerated treatment well   Behavior During Therapy [facility] for tasks assessed/performed      Past Medical History  Diagnosis Date  . Hypertension   . Hyperlipidemia   . Osteoarthritis   . Erectile dysfunction   . Chronic venous insufficiency   . Seborrheic keratoses     Multiple. Sees Lyndle Herrlich  . 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 CHF [id number]    Systolic failure. Cardiac cath in 2004: The LV showed mild global hypokinesia. EF of 45-50%. LVEDP was 12 mmHg. Left main was long which was patent. LAD has 10% distal stenosis. The vessel proximally was very tortuous. Diagonal one was small which was patent. Left circumflex was patent proximally and in mid portion and then tapers down in AV groove after giving off OM-2. OM-1 was large which was p  . Seizures ([place]) 1999    Past Surgical History  Procedure Laterality Date  . Hernia repair    . Skin cancer excision      left shoulder 3 yrs ago  . Eye surgery      bilateral cataracts  . Axillary lymph node dissection Left 08/12/2015  . Cataract extraction w/ intraocular lens  implant, bilateral Bilateral 2016  . Axillary lymph node dissection Left 08/12/2015    Procedure: LEFT AXILLARY LYMPH NODE DISSECTION;  Surgeon: Erroll Luna, MD;  Location: [facility];  Service: General;  Laterality: Left;    There were no vitals filed  for this visit.      Subjective Assessment - 10/10/15 0842    Subjective Pt reports new exercises are going well and he did some last night and this morning.    Pertinent History Lymph nodes, regional resection, left axillary, NODULAR MASS OF SQUAMOUS CELL CARCINOMA INVOLVING THE DEEP DERMIS AND SUBCUTANEOUS SOFT tissue, - 5 lymph nodes removed all benign, pt to begin radiation therapy next week   Patient Stated Goals to be able to lift the arm   Currently in Pain? No/denies   Pain Score 0-No pain            [facility] PT Assessment - 10/10/15 0001    AROM   Left Shoulder Flexion 144 Degrees   Left Shoulder ABduction 108 Degrees                     [facility] Adult PT Treatment/Exercise - 10/10/15 0001    Self-Care   Other Self-Care Comments  Spoke to patient's wife about compression sleeve and the person was not in to take the measurements. They will try again today or tomorrow. She plans on contacting her insurance company today to determine coverage.    Shoulder Exercises: Standing   Other Standing Exercises Instructed patient in ABC class shoulder stretches:  bilat. flexion with dowel, left abduction with dowel, doorway  ER stretch, left abduction with right sidebend, bilat. shoulder ER with scapular retraction actively, left upper trap stretch, backward shoulder rolls, all done for 5 count hold, 5 reps. Pt required mod verbal cues   Shoulder Exercises: Pulleys   Flexion 2 minutes   ABduction 2 minutes   Shoulder Exercises: Therapy Ball   Flexion 10 reps  with stretch at end   ABduction 10 reps  with stretch at end range   Manual Therapy   Manual Therapy Manual Lymphatic Drainage (MLD);Passive ROM   Manual Lymphatic Drainage (MLD) Instructed patient throughout MLD: diaphragmatic breathing.  Then, short neck, right axilla and anterior interaxillary anastomosis, left groin and axillo-inguinal anastomosis, and left UE from dorsal hand to shoulder, all in supine.  Verbal  explanation given for manual lymph drainage as it was done.   Passive ROM In supine with LUE pulling in to abduction                   Short Term Clinic Goals - 10/10/15 0903    CC Short Term Goal  #3   Title Pt and spouse will be independent in verbalizing lymphedema risk reduction practices   Baseline 10/10/15- Pt able to verbalize most precautions but not heat to LUE   Status On-going             [facility] Goals - 10/10/15 0904    CC Long Term Goal  #1   Title Pt will demonstrate 160 degrees of left shoulder flexion to allow pt to reach items overhead.    Baseline 108, 10/10/15- 144 degrees   Time 8   Period Weeks   Status On-going   CC Long Term Goal  #2   Title Pt will demonstrate 160 degrees of left shoulder abduction to allow pt to reach items out to sides.    Baseline 98, 10/10/15 - 108 - pt reports it is more stiff today   Time 8   Period Weeks   Status On-going   CC Long Term Goal  #3   Title Pt to be independent in a home exercise program for strengthening and stretching   Time 8   Period Weeks   Status On-going   CC Long Term Goal  #4   Title Pt will recieve an appropriate compression garment for long term management of edema   Baseline Pt to be measured in the next few days 10/10/15   Time 8   Period Weeks   Status On-going   CC Long Term Goal  #5   Title Pt and/or spouse will be independent in self drainage technique for long term management of edema.    Time 8   Period Weeks   Status On-going            Plan - 10/10/15 0925    Clinical Impression Statement Pt asked to demonstrate ABC exercises that were instructed to him last session as part of his HEP. Pt required mod verbal cues to perform exercises correctly. Pt states he has been compliant with his HEP. Pt was instructed in entire MLD process today. His L shoulder abduction ROM has decreased since it was last measured and pt states it feels very tight today so PROM was performed in  supine.    Rehab Potential Good   Clinical Impairments Affecting Rehab Potential pt's wife states he has poor memory, have pt's wife in room to educate   PT Frequency 2x / week   PT Duration 8  weeks   PT Treatment/Interventions Passive range of motion;Taping;Manual techniques;Manual lymph drainage;Vasopneumatic Device;Therapeutic exercise;Therapeutic activities;Patient/family education;DME Instruction;Scar mobilization;Compression bandaging;ADLs/Self Care Home Management   PT Next Visit Plan PROM/AAROM/AROM to Lt shoulder, and MLD to Lt UE   PT Home Exercise Plan supine dowel ROM exercises, ABC class stretching exercises   Consulted and Agree with Plan of Care Patient      Patient will benefit from skilled therapeutic intervention in order to improve the following deficits and impairments:  Decreased range of motion, Decreased strength, Increased fascial restricitons, Impaired UE functional use, Pain, Decreased knowledge of precautions, Decreased knowledge of use of DME  Visit Diagnosis: Stiffness of left shoulder, not elsewhere classified  Pain in left shoulder  Lymphedema, not elsewhere classified     Problem List Patient Active Problem List   Diagnosis Date Noted  . Axillary mass 08/12/2015  . Metastatic squamous cell carcinoma ([place]) 06/20/2015  . Mass of left axilla 05/22/2015  . Encounter for immunization 12/27/2014  . Chronic venous insufficiency   . H/O hyperglycemia 04/10/2014  . Gout 07/25/2013  . Anemia 07/25/2013  . Swelling of joint, wrist, left 07/13/2013  . Seborrheic keratoses, inflamed 10/26/2012  . Health care maintenance 10/26/2012  . Chronic diastolic congestive heart failure ([place]) 12/26/2008  . OSTEOARTHRITIS 05/27/2006  . Dyslipidemia 04/26/2006  . ERECTILE DYSFUNCTION 02/09/2006  . Essential hypertension 02/09/2006    Alexia Freestone 10/10/2015, 9:28 AM  [place], [place], [address] Phone: [phone]   Fax:  [phone]  Name: Dacoda Richters MRN: [id number] Date of Birth: [birth year]    Allyson Sabal, PT 10/10/2015 9:28 AM

## 2015-10-10 NOTE — Progress Notes (Signed)
   Department of Radiation Oncology  Phone:  [phone] Fax:        [phone]  Weekly Treatment Note    Name: Timothy Bradford Date: 10/10/2015 MRN: [id number] DOB: [birth year]   Diagnosis:     ICD-9-CM ICD-10-CM   1. Metastatic squamous cell carcinoma (HCC) 199.1 C79.9     C80.1      Current dose: 14 Gy  Current fraction: 7   MEDICATIONS: Current Outpatient Prescriptions  Medication Sig Dispense Refill  . acetaminophen (TYLENOL) 500 MG tablet Take 1,000 mg by mouth every 8 (eight) hours as needed. Reported on 10/02/2015    . aspirin EC 81 MG tablet Take by mouth.    . metoprolol tartrate (LOPRESSOR) 25 MG tablet Take 1 tablet (25 mg total) by mouth 2 (two) times daily. 180 tablet 3  . non-metallic deodorant (ALRA) MISC Apply 1 application topically daily as needed.    . simvastatin (ZOCOR) 40 MG tablet TAKE 1 TABLET BY MOUTH AT BEDTIME 90 tablet 3  . Wound Cleansers (RADIAPLEX EX) Apply topically.    Marland Kitchen oxyCODONE (OXY IR/ROXICODONE) 5 MG immediate release tablet Take 1-2 tablets (5-10 mg total) by mouth every 4 (four) hours as needed for moderate pain. (Patient not taking: Reported on 09/17/2015) 30 tablet 0  . [DISCONTINUED] potassium chloride (KLOR-CON) 20 MEQ packet Take 1 tablet by mouth twice a day. 90 tablet 3   No current facility-administered medications for this encounter.     ALLERGIES: Ace inhibitors and Fosinopril sodium   LABORATORY DATA:  Lab Results  Component Value Date   WBC 8.9 08/13/2015   HGB 11.0* 08/13/2015   HCT 35.0* 08/13/2015   MCV 92.1 08/13/2015   PLT 338 08/13/2015   Lab Results  Component Value Date   NA 142 08/13/2015   K 4.3 08/13/2015   CL 109 08/13/2015   CO2 23 08/13/2015   Lab Results  Component Value Date   ALT 11* 08/13/2015   AST 13* 08/13/2015   ALKPHOS 45 08/13/2015   BILITOT 0.4 08/13/2015     NARRATIVE: Timothy Bradford was seen today for weekly treatment management. The chart was checked and the patient's films  were reviewed.  Weight and vitals stable. Reports pain related to "several teeth that need to be pulled." This pain began several nights ago. Denies hyperpigmentation or desquamation within treatment site. Reports using radiaplex and alra as directed. Mild lymphedema noted left upper arm. Wife working with insurance to obtain compression sleeve.    PHYSICAL EXAMINATION: weight is 191 lb 4.8 oz (86.773 kg). His blood pressure is 151/78 and his pulse is 67. His respiration is 16 and oxygen saturation is 100%.      Alert. In no acute distress.  ASSESSMENT: The patient is doing satisfactorily with treatment.  PLAN: We will continue with the patient's radiation treatment as planned.     ------------------------------------------------  Jodelle Gross, MD, PhD  This document serves as a record of services personally performed by Kyung Rudd, MD. It was created on his behalf by Arlyce Harman, a trained medical scribe. The creation of this record is based on the scribe's personal observations and the provider's statements to them. This document has been checked and approved by the attending provider.

## 2015-10-10 NOTE — Progress Notes (Signed)
Weight and vitals stable. Reports pain related to "several teeth that need to be pulled." Denies hyperpigmentation or desquamation within treatment site. Reports using radiaplex and alra as directed. Mild lymphedema noted left upper arm. Wife working with insurance to obtain compression sleeve.   BP 151/78 mmHg  Pulse 67  Resp 16  Wt 191 lb 4.8 oz (86.773 kg)  SpO2 100% Wt Readings from Last 3 Encounters:  10/10/15 191 lb 4.8 oz (86.773 kg)  10/02/15 188 lb 12.8 oz (85.639 kg)  09/17/15 192 lb 6.4 oz (87.272 kg)

## 2015-10-13 ENCOUNTER — Ambulatory Visit
Admission: RE | Admit: 2015-10-13 | Discharge: 2015-10-13 | Disposition: A | Discharge status: Home / Self Care | Payer: Medicare Other | Source: Ambulatory Visit

## 2015-10-13 ENCOUNTER — Ambulatory Visit: Payer: Medicare Other | Admitting: Physical Therapy

## 2015-10-13 DIAGNOSIS — M25612 Stiffness of left shoulder, not elsewhere classified: Secondary | ICD-10-CM

## 2015-10-13 DIAGNOSIS — Z510 Encounter for antineoplastic radiation therapy: Secondary | ICD-10-CM | POA: Diagnosis not present

## 2015-10-13 DIAGNOSIS — M25512 Pain in left shoulder: Secondary | ICD-10-CM

## 2015-10-13 DIAGNOSIS — I890 Lymphedema, not elsewhere classified: Secondary | ICD-10-CM

## 2015-10-13 NOTE — Therapy (Signed)
[place], [place], [address] Phone: [phone]   Fax:  [phone]  Physical Therapy Treatment  Patient Details  Name: Timothy Bradford MRN: [id number] Date of Birth: 1935/08/20 Referring Provider: Dr. Tammi Klippel  Encounter Date: 10/13/2015      PT End of Session - 10/13/15 1019    Visit Number 6   Number of Visits 17   Date for PT Re-Evaluation 11/14/15   PT Start Time 0927   PT Stop Time 1014   PT Time Calculation (min) 47 min   Activity Tolerance Patient tolerated treatment well   Behavior During Therapy [facility] for tasks assessed/performed      Past Medical History  Diagnosis Date  . Hypertension   . Hyperlipidemia   . Osteoarthritis   . Erectile dysfunction   . Chronic venous insufficiency   . Seborrheic keratoses     Multiple. Sees Lyndle Herrlich  . 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 CHF [id number]    Systolic failure. Cardiac cath in 2004: The LV showed mild global hypokinesia. EF of 45-50%. LVEDP was 12 mmHg. Left main was long which was patent. LAD has 10% distal stenosis. The vessel proximally was very tortuous. Diagonal one was small which was patent. Left circumflex was patent proximally and in mid portion and then tapers down in AV groove after giving off OM-2. OM-1 was large which was p  . Seizures ([place]) 1999    Past Surgical History  Procedure Laterality Date  . Hernia repair    . Skin cancer excision      left shoulder 3 yrs ago  . Eye surgery      bilateral cataracts  . Axillary lymph node dissection Left 08/12/2015  . Cataract extraction w/ intraocular lens  implant, bilateral Bilateral 2016  . Axillary lymph node dissection Left 08/12/2015    Procedure: LEFT AXILLARY LYMPH NODE DISSECTION;  Surgeon: Erroll Luna, MD;  Location: [facility];  Service: General;  Laterality: Left;    There were no vitals filed  for this visit.      Subjective Assessment - 10/13/15 0930    Subjective Still working on home exercise program. No questions about that.  Says he can move the arm better reaching for things.   Currently in Pain? No/denies            [facility] PT Assessment - 10/13/15 0001    Observation/Other Assessments   Observations pitting edema of left forearm noted when PROM to left shoulder was performed                     [facility] Adult PT Treatment/Exercise - 10/13/15 0001    Shoulder Exercises: Supine   Flexion AROM;Both;5 reps   ABduction AROM;Both;10 reps   Shoulder Exercises: Pulleys   Flexion 2 minutes   ABduction 2 minutes   Manual Therapy   Myofascial Release left UE myofascial pulling with movement into shoulder abduction   Manual Lymphatic Drainage (MLD) Instructed patient throughout MLD: diaphragmatic breathing, then short neck, superficial abdomen, right axilla and anterior interaxillary anastomosis, left groin and axillo-inguinal anastomosis, and left UE from dorsal hand to shoulder, all in supine.  Verbal explanation given for manual lymph drainage as it was done.   Passive ROM In supine to left shoulder for ER, abduction, and flexion; left elbow into extension  Short Term Clinic Goals - 10/10/15 [phone]    CC Short Term Goal  #3   Title Pt and spouse will be independent in verbalizing lymphedema risk reduction practices   Baseline 10/10/15- Pt able to verbalize most precautions but not heat to LUE   Status On-going             [facility] Goals - 10/10/15 0904    CC Long Term Goal  #1   Title Pt will demonstrate 160 degrees of left shoulder flexion to allow pt to reach items overhead.    Baseline 108, 10/10/15- 144 degrees   Time 8   Period Weeks   Status On-going   CC Long Term Goal  #2   Title Pt will demonstrate 160 degrees of left shoulder abduction to allow pt to reach items out to sides.    Baseline 98, 10/10/15 - 108 -  pt reports it is more stiff today   Time 8   Period Weeks   Status On-going   CC Long Term Goal  #3   Title Pt to be independent in a home exercise program for strengthening and stretching   Time 8   Period Weeks   Status On-going   CC Long Term Goal  #4   Title Pt will recieve an appropriate compression garment for long term management of edema   Baseline Pt to be measured in the next few days 10/10/15   Time 8   Period Weeks   Status On-going   CC Long Term Goal  #5   Title Pt and/or spouse will be independent in self drainage technique for long term management of edema.    Time 8   Period Weeks   Status On-going            Plan - 10/13/15 1014    Clinical Impression Statement Doing well with therapy, but still with significant stiffness in left shoulder.   Rehab Potential Good   Clinical Impairments Affecting Rehab Potential pt's wife states he has poor memory, have pt's wife in room to educate   PT Frequency 2x / week   PT Duration 8 weeks   PT Treatment/Interventions Passive range of motion;Taping;Manual techniques;Manual lymph drainage;Vasopneumatic Device;Therapeutic exercise;Therapeutic activities;Patient/family education;DME Instruction;Scar mobilization;Compression bandaging;ADLs/Self Care Home Management   PT Next Visit Plan Teach patient's wife manual lymph drainage, add more AROM to left shoulder; continue P/ROM and AA/ROM to left shoulder.  Remeasure.   PT Home Exercise Plan supine dowel ROM exercises, ABC class stretching exercises   Consulted and Agree with Plan of Care Patient      Patient will benefit from skilled therapeutic intervention in order to improve the following deficits and impairments:  Decreased range of motion, Decreased strength, Increased fascial restricitons, Impaired UE functional use, Pain, Decreased knowledge of precautions, Decreased knowledge of use of DME  Visit Diagnosis: Stiffness of left shoulder, not elsewhere classified  Pain in  left shoulder  Lymphedema, not elsewhere classified     Problem List Patient Active Problem List   Diagnosis Date Noted  . Axillary mass 08/12/2015  . Metastatic squamous cell carcinoma ([place]) 06/20/2015  . Mass of left axilla 05/22/2015  . Encounter for immunization 12/27/2014  . Chronic venous insufficiency   . H/O hyperglycemia 04/10/2014  . Gout 07/25/2013  . Anemia 07/25/2013  . Swelling of joint, wrist, left 07/13/2013  . Seborrheic keratoses, inflamed 10/26/2012  . Health care maintenance 10/26/2012  . Chronic diastolic congestive heart failure ([place]) 12/26/2008  .  OSTEOARTHRITIS 05/27/2006  . Dyslipidemia 04/26/2006  . ERECTILE DYSFUNCTION 02/09/2006  . Essential hypertension 02/09/2006    SALISBURY,DONNA 10/13/2015, 10:20 AM  [place], [place], [address] Phone: [phone]   Fax:  [phone]  Name: Timothy Bradford MRN: [id number] Date of Birth: 15-Mar-1936    Serafina Royals, PT 10/13/2015 10:21 AM

## 2015-10-14 ENCOUNTER — Ambulatory Visit
Admission: RE | Admit: 2015-10-14 | Discharge: 2015-10-14 | Disposition: A | Discharge status: Home / Self Care | Payer: Medicare Other | Source: Ambulatory Visit

## 2015-10-14 DIAGNOSIS — Z510 Encounter for antineoplastic radiation therapy: Secondary | ICD-10-CM | POA: Diagnosis not present

## 2015-10-15 ENCOUNTER — Ambulatory Visit: Payer: Medicare Other | Admitting: Physical Therapy

## 2015-10-15 ENCOUNTER — Ambulatory Visit
Admission: RE | Admit: 2015-10-15 | Discharge: 2015-10-15 | Disposition: A | Discharge status: Home / Self Care | Payer: Medicare Other | Source: Ambulatory Visit

## 2015-10-15 DIAGNOSIS — Z510 Encounter for antineoplastic radiation therapy: Secondary | ICD-10-CM | POA: Diagnosis not present

## 2015-10-15 DIAGNOSIS — M25612 Stiffness of left shoulder, not elsewhere classified: Secondary | ICD-10-CM | POA: Diagnosis not present

## 2015-10-15 DIAGNOSIS — I890 Lymphedema, not elsewhere classified: Secondary | ICD-10-CM

## 2015-10-15 NOTE — Patient Instructions (Signed)
Deep Effective Breath  [phone]   Standing, sitting, or laying down, place both hands on the belly. Take a deep breath IN, expanding the belly; then breath OUT, contracting the belly. Repeat __5__ times. Do __2-3__ sessions per day and before your self massage.  http://gt2.exer.us/866   Copyright  VHI. All rights reserved.  Axilla to Axilla - Sweep   On uninvolved side make 5 circles in the armpit, then pump _5__ times from involved armpit across chest to uninvolved armpit, making a pathway. Do _1__ time per day.  Copyright  VHI. All rights reserved.  Axilla to Inguinal Nodes - Sweep   On involved side, make 5 circles at groin at panty line, then pump _5__ times from armpit along side of trunk to outer hip, making your other pathway. Do __1_ time per day.  Copyright  VHI. All rights reserved.  Arm Posterior: Elbow to Shoulder - Sweep   Pump _5__ times from back of elbow to top of shoulder. Then inner to outer upper arm _5_ times, then outer arm again _5_ times. Then back to the pathways _2-3_ times. Do _1__ time per day.  Copyright  VHI. All rights reserved.  ARM: [place]olar Wrist to Elbow - Sweep   Pump or stationary circles _5__ times from wrist to elbow making sure to do both sides of the forearm. Then retrace your steps to the outer arm, and the pathways _2-3_ times each. Do _1__ time per day.  Copyright  VHI. All rights reserved.  ARM: Dorsum of Hand to Shoulder - Sweep   Pump or stationary circles _5__ times on back of hand including knuckle spaces and individual fingers if needed working up towards the wrist, then retrace all your steps working back up the forearm, doing both sides; upper outer arm and back to your pathways _2-3_ times each. Then do 5 circles again at uninvolved armpit and involved groin where you started! Good job!! Do __1_ time per day.  Copyright  VHI. All rights reserved.

## 2015-10-15 NOTE — Therapy (Signed)
[place], [place], [address] Phone: [phone]   Fax:  [phone]  Physical Therapy Treatment  Patient Details  Name: Timothy Bradford MRN: [id number] Date of Birth: Aug 30, 1935 Referring Provider: Dr. Tammi Klippel  Encounter Date: 10/15/2015      PT End of Session - 10/15/15 1447    Visit Number 7   Number of Visits 17   Date for PT Re-Evaluation 11/14/15   PT Start Time 0936   PT Stop Time 1018   PT Time Calculation (min) 42 min   Activity Tolerance Patient tolerated treatment well   Behavior During Therapy [facility] for tasks assessed/performed      Past Medical History  Diagnosis Date  . Hypertension   . Hyperlipidemia   . Osteoarthritis   . Erectile dysfunction   . Chronic venous insufficiency   . Seborrheic keratoses     Multiple. Sees Lyndle Herrlich  . 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 CHF [id number]    Systolic failure. Cardiac cath in 2004: The LV showed mild global hypokinesia. EF of 45-50%. LVEDP was 12 mmHg. Left main was long which was patent. LAD has 10% distal stenosis. The vessel proximally was very tortuous. Diagonal one was small which was patent. Left circumflex was patent proximally and in mid portion and then tapers down in AV groove after giving off OM-2. OM-1 was large which was p  . Seizures ([place]) 1999    Past Surgical History  Procedure Laterality Date  . Hernia repair    . Skin cancer excision      left shoulder 3 yrs ago  . Eye surgery      bilateral cataracts  . Axillary lymph node dissection Left 08/12/2015  . Cataract extraction w/ intraocular lens  implant, bilateral Bilateral 2016  . Axillary lymph node dissection Left 08/12/2015    Procedure: LEFT AXILLARY LYMPH NODE DISSECTION;  Surgeon: Erroll Luna, MD;  Location: [facility];  Service: General;  Laterality: Left;    There were no vitals filed  for this visit.      Subjective Assessment - 10/15/15 0936    Subjective Nothing new.  Stretches are going pretty good.  Having difficulty getting through to the vendors that her insurance company directed them to for getting a compression sleeve.   Currently in Pain? Yes   Pain Score 1    Pain Location Shoulder   Pain Orientation Left   Pain Descriptors / Indicators Tightness   Aggravating Factors  sleeping on it the wrong   Pain Relieving Factors keep it still                         [facility] Adult PT Treatment/Exercise - 10/15/15 0001    Self-Care   Other Self-Care Comments  Instructed patient's wife in manual lymph drainage as outlined below (see education section)   Manual Therapy   Manual Lymphatic Drainage (MLD) Performed and instructed patient's wife as follows:  diaphragmatic breathing, short neck, right axilla and anterior interaxillary anastomosis, left groin and axillo-inguinal anastomosis, and left UE from dorsal hand to shoulder, all in supine.                PT Education - 10/15/15 1446    Education provided Yes   Education Details manual lymph drainage   Person(s) Educated Spouse   Methods  Explanation;Demonstration;Tactile cues;Verbal cues;Handout   Comprehension Verbalized understanding;Returned demonstration           Short Term Clinic Goals - 10/15/15 1449    CC Short Term Goal  #3   Title Pt and spouse will be independent in verbalizing lymphedema risk reduction practices   Status On-going             Long Term Clinic Goals - 10/15/15 1449    CC Long Term Goal  #1   Title Pt will demonstrate 160 degrees of left shoulder flexion to allow pt to reach items overhead.    Status On-going   CC Long Term Goal  #2   Title Pt will demonstrate 160 degrees of left shoulder abduction to allow pt to reach items out to sides.    Status On-going   CC Long Term Goal  #3   Title Pt to be independent in a home exercise program for  strengthening and stretching   Status On-going   CC Long Term Goal  #4   Title Pt will recieve an appropriate compression garment for long term management of edema   Status On-going   CC Long Term Goal  #5   Title Pt and/or spouse will be independent in self drainage technique for long term management of edema.    Status Achieved            Plan - 10/15/15 1447    Clinical Impression Statement Patient's wife did well learning to do manual lymph drainage today.  She will probably benefit from review, or at least having any questions answered.     Rehab Potential Good   Clinical Impairments Affecting Rehab Potential pt's wife states he has poor memory, have pt's wife in room to educate   PT Frequency 2x / week   PT Duration 8 weeks   PT Treatment/Interventions Passive range of motion;Taping;Manual techniques;Manual lymph drainage;Vasopneumatic Device;Therapeutic exercise;Therapeutic activities;Patient/family education;DME Instruction;Scar mobilization;Compression bandaging;ADLs/Self Care Home Management   PT Next Visit Plan Remeasure.  Review manual lymph drainage with patient's wife. Add more AROM to left shoulder and continue P/ and AA/ROM.   PT Home Exercise Plan supine dowel ROM exercises, ABC class stretching exercises   Consulted and Agree with Plan of Care Patient;Family member/caregiver      Patient will benefit from skilled therapeutic intervention in order to improve the following deficits and impairments:  Decreased range of motion, Decreased strength, Increased fascial restricitons, Impaired UE functional use, Pain, Decreased knowledge of precautions, Decreased knowledge of use of DME  Visit Diagnosis: Lymphedema, not elsewhere classified     Problem List Patient Active Problem List   Diagnosis Date Noted  . Axillary mass 08/12/2015  . Metastatic squamous cell carcinoma ([place]) 06/20/2015  . Mass of left axilla 05/22/2015  . Encounter for immunization 12/27/2014  .  Chronic venous insufficiency   . H/O hyperglycemia 04/10/2014  . Gout 07/25/2013  . Anemia 07/25/2013  . Swelling of joint, wrist, left 07/13/2013  . Seborrheic keratoses, inflamed 10/26/2012  . Health care maintenance 10/26/2012  . Chronic diastolic congestive heart failure ([place]) 12/26/2008  . OSTEOARTHRITIS 05/27/2006  . Dyslipidemia 04/26/2006  . ERECTILE DYSFUNCTION 02/09/2006  . Essential hypertension 02/09/2006    Timothy Bradford 10/15/2015, 2:50 PM  [place], [place], [address] Phone: [phone]   Fax:  [phone]  Name: Timothy Bradford MRN: [id number] Date of Birth: March 09, 1936    Serafina Royals, PT 10/15/2015 2:50 PM

## 2015-10-16 ENCOUNTER — Ambulatory Visit
Admission: RE | Admit: 2015-10-16 | Discharge: 2015-10-16 | Disposition: A | Discharge status: Home / Self Care | Payer: Medicare Other | Source: Ambulatory Visit

## 2015-10-16 DIAGNOSIS — Z510 Encounter for antineoplastic radiation therapy: Secondary | ICD-10-CM | POA: Diagnosis not present

## 2015-10-17 ENCOUNTER — Ambulatory Visit
Admission: RE | Admit: 2015-10-17 | Discharge: 2015-10-17 | Disposition: A | Discharge status: Home / Self Care | Payer: Medicare Other | Source: Ambulatory Visit | Attending: Radiation Oncology | Admitting: Radiation Oncology

## 2015-10-17 ENCOUNTER — Ambulatory Visit
Admission: RE | Admit: 2015-10-17 | Discharge: 2015-10-17 | Disposition: A | Discharge status: Home / Self Care | Payer: Medicare Other | Source: Ambulatory Visit

## 2015-10-17 ENCOUNTER — Encounter: Payer: Self-pay | Admitting: Radiation Oncology

## 2015-10-17 VITALS — BP 132/77 | HR 71 | Temp 98.8°F | Ht 74.0 in | Wt 187.6 lb

## 2015-10-17 DIAGNOSIS — C799 Secondary malignant neoplasm of unspecified site: Secondary | ICD-10-CM

## 2015-10-17 DIAGNOSIS — IMO0002 Reserved for concepts with insufficient information to code with codable children: Secondary | ICD-10-CM

## 2015-10-17 DIAGNOSIS — Z510 Encounter for antineoplastic radiation therapy: Secondary | ICD-10-CM | POA: Diagnosis not present

## 2015-10-17 NOTE — Progress Notes (Signed)
  Radiation Oncology         [phone]   Name: Timothy Bradford MRN: [id number]   Date: 10/17/2015  DOB: [birth year]     Weekly Radiation Therapy Management    ICD-9-CM ICD-10-CM   1. Metastatic squamous cell carcinoma (HCC) 199.1 C79.9     C80.1     Current Dose: 24 Gy  Planned Dose:  60 Gy  Narrative The patient presents for routine under treatment assessment. Timothy Bradford has received 12 fractions to his left axilla. He denies any pain or fatigue. The patient is without complaint. Set-up films were reviewed. The chart was checked.  Physical Findings  height is 6\' 2"  (1.88 m) and weight is 187 lb 9.6 oz (85.095 kg). His temperature is 98.8 F (37.1 C). His blood pressure is 132/77 and his pulse is 71. . Weight essentially stable.  There was some minimal hyperpigmentation in the treatment field and no radiation dermatitis.  Impression The patient is tolerating radiation.  Plan Continue treatment as planned.         Sheral Apley Tammi Klippel, M.D.  This document serves as a record of services personally performed by Tyler Pita, MD. It was created on his behalf by Arlyce Harman, a trained medical scribe. The creation of this record is based on the scribe's personal observations and the provider's statements to them. This document has been checked and approved by the attending provider.

## 2015-10-17 NOTE — Progress Notes (Signed)
Timothy Bradford has received 12 fractions to his left axilla.  He denies any pain.  Incision intact and note marked hyperpigmentation at this site.   Denies any fatigue.

## 2015-10-20 ENCOUNTER — Ambulatory Visit: Payer: Medicare Other | Admitting: Physical Therapy

## 2015-10-20 ENCOUNTER — Encounter: Payer: Self-pay | Admitting: Physical Therapy

## 2015-10-20 ENCOUNTER — Ambulatory Visit
Admission: RE | Admit: 2015-10-20 | Discharge: 2015-10-20 | Disposition: A | Discharge status: Home / Self Care | Payer: Medicare Other | Source: Ambulatory Visit

## 2015-10-20 DIAGNOSIS — Z510 Encounter for antineoplastic radiation therapy: Secondary | ICD-10-CM | POA: Diagnosis not present

## 2015-10-20 DIAGNOSIS — I890 Lymphedema, not elsewhere classified: Secondary | ICD-10-CM

## 2015-10-20 DIAGNOSIS — M25612 Stiffness of left shoulder, not elsewhere classified: Secondary | ICD-10-CM | POA: Diagnosis not present

## 2015-10-20 DIAGNOSIS — M25512 Pain in left shoulder: Secondary | ICD-10-CM

## 2015-10-20 NOTE — Therapy (Signed)
[place], [place], [address] Phone: [phone]   Fax:  [phone]  Physical Therapy Treatment  Patient Details  Name: Timothy Bradford MRN: [id number] Date of Birth: Sep 12, 1935 Referring Provider: Dr. Tammi Klippel  Encounter Date: 10/20/2015      PT End of Session - 10/20/15 1000    Visit Number 8   Number of Visits 17   Date for PT Re-Evaluation 11/14/15   PT Start Time 0930   PT Stop Time 1016   PT Time Calculation (min) 46 min   Activity Tolerance Patient tolerated treatment well   Behavior During Therapy [facility] for tasks assessed/performed      Past Medical History  Diagnosis Date  . Hypertension   . Hyperlipidemia   . Osteoarthritis   . Erectile dysfunction   . Chronic venous insufficiency   . Seborrheic keratoses     Multiple. Sees Lyndle Herrlich  . 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 CHF [id number]    Systolic failure. Cardiac cath in 2004: The LV showed mild global hypokinesia. EF of 45-50%. LVEDP was 12 mmHg. Left main was long which was patent. LAD has 10% distal stenosis. The vessel proximally was very tortuous. Diagonal one was small which was patent. Left circumflex was patent proximally and in mid portion and then tapers down in AV groove after giving off OM-2. OM-1 was large which was p  . Seizures ([place]) 1999    Past Surgical History  Procedure Laterality Date  . Hernia repair    . Skin cancer excision      left shoulder 3 yrs ago  . Eye surgery      bilateral cataracts  . Axillary lymph node dissection Left 08/12/2015  . Cataract extraction w/ intraocular lens  implant, bilateral Bilateral 2016  . Axillary lymph node dissection Left 08/12/2015    Procedure: LEFT AXILLARY LYMPH NODE DISSECTION;  Surgeon: Erroll Luna, MD;  Location: [place];  Service: General;  Laterality: Left;    There were no vitals filed  for this visit.      Subjective Assessment - 10/20/15 0928    Subjective Pt's wife states she has tried the massage at home "just a little." She reports she does not have questions at this time. Pt's wife states pt's grandaughter will assist in purchasing compression sleeve and they will go to get measured on Wednesday or Thursday of this week.   Patient is accompained by: Family member   Pertinent History Lymph nodes, regional resection, left axillary, NODULAR MASS OF SQUAMOUS CELL CARCINOMA INVOLVING THE DEEP DERMIS AND SUBCUTANEOUS SOFT tissue, - 5 lymph nodes removed all benign, pt to begin radiation therapy next week   Patient Stated Goals to be able to lift the arm   Currently in Pain? No/denies   Pain Score 0-No pain               LYMPHEDEMA/ONCOLOGY QUESTIONNAIRE - 10/20/15 1013    Left Upper Extremity Lymphedema   15 cm Proximal to Olecranon Process 26.5 cm   Olecranon Process 26.9 cm   15 cm Proximal to Ulnar Styloid Process 21.9 cm   10 cm Proximal to Ulnar Styloid Process 19 cm   Just Proximal to Ulnar Styloid Process 17.5 cm   Across Hand at [employer] 21.4 cm   At [place] of 2nd Digit 6.7 cm  [facility] Adult PT Treatment/Exercise - 10/20/15 0001    Self-Care   Other Self-Care Comments  Instructed patient's wife in manual lymph drainage as outlined below (see education section)   Shoulder Exercises: Standing   Other Standing Exercises Instructed patient in ABC class shoulder stretches:  bilat. flexion with dowel, left abduction with dowel, doorway ER stretch, left abduction with right sidebend, bilat. shoulder ER with scapular retraction actively, left upper trap stretch, backward shoulder rolls, all done for 5 count hold, 5 reps. Pt required mod verbal cues for all exercises but max verbal cues for abduction with dowel   Shoulder Exercises: Pulleys   Flexion 2 minutes   ABduction 2 minutes   Manual Therapy   Manual Lymphatic Drainage  (MLD) Performed and instructed patient's wife as follows:  diaphragmatic breathing, short neck, right axilla and anterior interaxillary anastomosis, left groin and axillo-inguinal anastomosis, and left UE from dorsal hand to shoulder, all in supine.   Passive ROM In supine to left shoulder for ER, abduction, and flexion; left elbow into extension                PT Education - 10/20/15 0959    Education provided Yes   Education Details manual lymph drainage   Person(s) Educated Spouse   Methods Explanation;Demonstration   Comprehension Verbalized understanding           Short Term Clinic Goals - 10/15/15 1449    CC Short Term Goal  #3   Title Pt and spouse will be independent in verbalizing lymphedema risk reduction practices   Status On-going             Long Term Clinic Goals - 10/15/15 1449    CC Long Term Goal  #1   Title Pt will demonstrate 160 degrees of left shoulder flexion to allow pt to reach items overhead.    Status On-going   CC Long Term Goal  #2   Title Pt will demonstrate 160 degrees of left shoulder abduction to allow pt to reach items out to sides.    Status On-going   CC Long Term Goal  #3   Title Pt to be independent in a home exercise program for strengthening and stretching   Status On-going   CC Long Term Goal  #4   Title Pt will recieve an appropriate compression garment for long term management of edema   Status On-going   CC Long Term Goal  #5   Title Pt and/or spouse will be independent in self drainage technique for long term management of edema.    Status Achieved            Plan - 10/20/15 1001    Clinical Impression Statement Pt's wife verbalizes understand manual lymphatic drainage sequence and technique though she admits she has not done it much secondary to her shoulder pain.Pt demonstrates reduction of L upper extremity edema today. He plans on being measured for a compression bra this week. He has been compliant with his  home exercise program but still requires mod cues to perform exercises correctly.    Rehab Potential Good   Clinical Impairments Affecting Rehab Potential pt's wife states he has poor memory, have pt's wife in room to educate   PT Frequency 2x / week   PT Duration 8 weeks   PT Next Visit Plan Remeasure.  Review manual lymph drainage with patient's wife. Add more AROM to left shoulder and continue P/ and AA/ROM.   PT Home Exercise  Plan supine dowel ROM exercises, ABC class stretching exercises   Consulted and Agree with Plan of Care Patient;Family member/caregiver      Patient will benefit from skilled therapeutic intervention in order to improve the following deficits and impairments:  Decreased range of motion, Decreased strength, Increased fascial restricitons, Impaired UE functional use, Pain, Decreased knowledge of precautions, Decreased knowledge of use of DME  Visit Diagnosis: Lymphedema, not elsewhere classified  Stiffness of left shoulder, not elsewhere classified  Pain in left shoulder     Problem List Patient Active Problem List   Diagnosis Date Noted  . Axillary mass 08/12/2015  . Metastatic squamous cell carcinoma ([place]) 06/20/2015  . Mass of left axilla 05/22/2015  . Encounter for immunization 12/27/2014  . Chronic venous insufficiency   . H/O hyperglycemia 04/10/2014  . Gout 07/25/2013  . Anemia 07/25/2013  . Swelling of joint, wrist, left 07/13/2013  . Seborrheic keratoses, inflamed 10/26/2012  . Health care maintenance 10/26/2012  . Chronic diastolic congestive heart failure ([place]) 12/26/2008  . OSTEOARTHRITIS 05/27/2006  . Dyslipidemia 04/26/2006  . ERECTILE DYSFUNCTION 02/09/2006  . Essential hypertension 02/09/2006    Alexia Freestone 10/20/2015, 10:21 AM  [place], [place], [address] Phone: [phone]   Fax:  [phone]  Name: Timothy Bradford MRN: [id number] Date of  Birth: 12-23-1935    Allyson Sabal, PT 10/20/2015 10:21 AM

## 2015-10-21 ENCOUNTER — Ambulatory Visit
Admission: RE | Admit: 2015-10-21 | Discharge: 2015-10-21 | Disposition: A | Discharge status: Home / Self Care | Payer: Medicare Other | Source: Ambulatory Visit

## 2015-10-21 DIAGNOSIS — Z510 Encounter for antineoplastic radiation therapy: Secondary | ICD-10-CM | POA: Diagnosis not present

## 2015-10-22 ENCOUNTER — Encounter: Payer: Self-pay | Admitting: Physical Therapy

## 2015-10-22 ENCOUNTER — Ambulatory Visit
Admission: RE | Admit: 2015-10-22 | Discharge: 2015-10-22 | Disposition: A | Discharge status: Home / Self Care | Payer: Medicare Other | Source: Ambulatory Visit

## 2015-10-22 ENCOUNTER — Ambulatory Visit: Payer: Medicare Other | Admitting: Physical Therapy

## 2015-10-22 DIAGNOSIS — M25612 Stiffness of left shoulder, not elsewhere classified: Secondary | ICD-10-CM

## 2015-10-22 DIAGNOSIS — M25512 Pain in left shoulder: Secondary | ICD-10-CM

## 2015-10-22 DIAGNOSIS — Z510 Encounter for antineoplastic radiation therapy: Secondary | ICD-10-CM | POA: Diagnosis not present

## 2015-10-22 NOTE — Therapy (Signed)
[place], [place], [address] Phone: [phone]   Fax:  [phone]  Physical Therapy Treatment  Patient Details  Name: Timothy Bradford MRN: [id number] Date of Birth: 1935-12-12 Referring Provider: Dr. Tammi Klippel  Encounter Date: 10/22/2015      PT End of Session - 10/22/15 1141    Visit Number 9   Number of Visits 17   Date for PT Re-Evaluation 11/14/15   PT Start Time 0932   PT Stop Time 1015   PT Time Calculation (min) 43 min   Activity Tolerance Patient tolerated treatment well   Behavior During Therapy [facility] for tasks assessed/performed      Past Medical History  Diagnosis Date  . Hypertension   . Hyperlipidemia   . Osteoarthritis   . Erectile dysfunction   . Chronic venous insufficiency   . Seborrheic keratoses     Multiple. Sees Lyndle Herrlich  . 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 CHF [id number]    Systolic failure. Cardiac cath in 2004: The LV showed mild global hypokinesia. EF of 45-50%. LVEDP was 12 mmHg. Left main was long which was patent. LAD has 10% distal stenosis. The vessel proximally was very tortuous. Diagonal one was small which was patent. Left circumflex was patent proximally and in mid portion and then tapers down in AV groove after giving off OM-2. OM-1 was large which was p  . Seizures ([place]) 1999    Past Surgical History  Procedure Laterality Date  . Hernia repair    . Skin cancer excision      left shoulder 3 yrs ago  . Eye surgery      bilateral cataracts  . Axillary lymph node dissection Left 08/12/2015  . Cataract extraction w/ intraocular lens  implant, bilateral Bilateral 2016  . Axillary lymph node dissection Left 08/12/2015    Procedure: LEFT AXILLARY LYMPH NODE DISSECTION;  Surgeon: Erroll Luna, MD;  Location: [facility];  Service: General;  Laterality: Left;    There were no vitals filed  for this visit.      Subjective Assessment - 10/22/15 0933    Subjective The massage is going pretty good. My exercises start when I get out of the bed. I will probably get my compression sleeve tomorrow.    Pertinent History Lymph nodes, regional resection, left axillary, NODULAR MASS OF SQUAMOUS CELL CARCINOMA INVOLVING THE DEEP DERMIS AND SUBCUTANEOUS SOFT tissue, - 5 lymph nodes removed all benign, pt to begin radiation therapy next week   Patient Stated Goals to be able to lift the arm   Currently in Pain? No/denies   Pain Score 0-No pain            [facility] PT Assessment - 10/22/15 0001    AROM   Left Shoulder Flexion 149 Degrees   Left Shoulder ABduction 120 Degrees                     [facility] Adult PT Treatment/Exercise - 10/22/15 0001    Shoulder Exercises: Supine   Horizontal ABduction Strengthening;Both;10 reps;Theraband   Theraband Level (Shoulder Horizontal ABduction) Level 1 (Yellow)   External Rotation Strengthening;Both;10 reps;Theraband   Theraband Level (Shoulder External Rotation) Level 1 (Yellow)   Flexion Strengthening;Both;10 reps;Theraband  narrow and then wide grip   Theraband Level (Shoulder Flexion) Level 1 (Yellow)   Other Supine Exercises D2 x  10 reps bilaterally with yellow band  with max cueing to perform correctly   Shoulder Exercises: Standing   External Rotation Strengthening;Both;10 reps;Theraband   Theraband Level (Shoulder External Rotation) Level 1 (Yellow)   Internal Rotation Strengthening;10 reps;Theraband;Both   Theraband Level (Shoulder Internal Rotation) Level 1 (Yellow)   Flexion Strengthening;Both;10 reps;Theraband   Theraband Level (Shoulder Flexion) Level 1 (Yellow)   Extension Strengthening;Both;10 reps;Theraband   Theraband Level (Shoulder Extension) Level 1 (Yellow)   Other Standing Exercises W arms x 10 reps each against wall  pt requires frequent cues to perform correctly   Shoulder Exercises: Pulleys   Flexion 2  minutes   ABduction 2 minutes   Manual Therapy   Passive ROM In supine to left shoulder for ER, abduction, and flexion; left elbow into extension                   Short Term Clinic Goals - 10/15/15 1449    CC Short Term Goal  #3   Title Pt and spouse will be independent in verbalizing lymphedema risk reduction practices   Status On-going             Long Term Clinic Goals - 10/22/15 1000    CC Long Term Goal  #1   Title Pt will demonstrate 160 degrees of left shoulder flexion to allow pt to reach items overhead.    Baseline 108, 10/10/15- 144 degrees, 10/22/15 -149   Time 8   Period Weeks   Status On-going   CC Long Term Goal  #2   Title Pt will demonstrate 160 degrees of left shoulder abduction to allow pt to reach items out to sides.    Baseline 98, 10/10/15 - 108 - pt reports it is more stiff today, 10/22/15- 120   Time 8   Period Weeks   Status On-going   CC Long Term Goal  #3   Title Pt to be independent in a home exercise program for strengthening and stretching   Time 8   Period Weeks   Status On-going   CC Long Term Goal  #4   Title Pt will recieve an appropriate compression garment for long term management of edema   Baseline Pt to be measured in the next few days 10/10/15, 10/22/15- pt states he will be measured this week   Time 8   Period Weeks   Status On-going   CC Long Term Goal  #5   Title Pt and/or spouse will be independent in self drainage technique for long term management of edema.    Time 8   Period Weeks   Status Achieved            Plan - 10/22/15 1142    Clinical Impression Statement Pt continues to progress towards his ROM goals. His left shoulder flexion and abduction continue to improve. Pt was educated in new exercises today using theraband. Next session he will be ready to progress from yellow to red theraband assuming he had no increase in soreness.    Rehab Potential Good   Clinical Impairments Affecting Rehab Potential pt's  wife states he has poor memory, have pt's wife in room to educate   PT Frequency 2x / week   PT Duration 8 weeks   PT Treatment/Interventions Passive range of motion;Taping;Manual techniques;Manual lymph drainage;Vasopneumatic Device;Therapeutic exercise;Therapeutic activities;Patient/family education;DME Instruction;Scar mobilization;Compression bandaging;ADLs/Self Care Home Management   PT Next Visit Plan Remeasure.  Review manual lymph drainage with patient's wife. Add more AROM to  left shoulder and continue P/ and AA/ROM increase from yellow to red band - give supine scap as HEP   PT Home Exercise Plan supine dowel ROM exercises, ABC class stretching exercises   Consulted and Agree with Plan of Care Patient      Patient will benefit from skilled therapeutic intervention in order to improve the following deficits and impairments:  Decreased range of motion, Decreased strength, Increased fascial restricitons, Impaired UE functional use, Pain, Decreased knowledge of precautions, Decreased knowledge of use of DME  Visit Diagnosis: Stiffness of left shoulder, not elsewhere classified  Pain in left shoulder     Problem List Patient Active Problem List   Diagnosis Date Noted  . Axillary mass 08/12/2015  . Metastatic squamous cell carcinoma ([place]) 06/20/2015  . Mass of left axilla 05/22/2015  . Encounter for immunization 12/27/2014  . Chronic venous insufficiency   . H/O hyperglycemia 04/10/2014  . Gout 07/25/2013  . Anemia 07/25/2013  . Swelling of joint, wrist, left 07/13/2013  . Seborrheic keratoses, inflamed 10/26/2012  . Health care maintenance 10/26/2012  . Chronic diastolic congestive heart failure ([place]) 12/26/2008  . OSTEOARTHRITIS 05/27/2006  . Dyslipidemia 04/26/2006  . ERECTILE DYSFUNCTION 02/09/2006  . Essential hypertension 02/09/2006    Alexia Freestone 10/22/2015, 11:44 AM  [place], [place], [address] Phone: [phone]   Fax:  [phone]  Name: Lorn Eledge MRN: [id number] Date of Birth: 1935/11/29    Allyson Sabal, PT 10/22/2015 11:44 AM

## 2015-10-22 NOTE — Patient Instructions (Signed)
Strengthening: Resisted Internal Rotation   Hold tubing in left hand, elbow at side and forearm out. Rotate forearm in across body. Repeat _10___ times per set.  Repeat on opposite side. Do _1__ sets per session. Do __2__ sessions per day.  http://orth.exer.us/830   Copyright  VHI. All rights reserved.  Strengthening: Resisted External Rotation   Hold tubing in right hand, elbow at side and forearm across body. Rotate forearm out. Repeat _10___ times per set. Repeat on opposite side. Do _1___ sets per session. Do __2__ sessions per day.  http://orth.exer.us/828   Copyright  VHI. All rights reserved.  Strengthening: Resisted Flexion   Hold tubing with left arm at side. Pull forward and up. Move shoulder through pain-free range of motion. Repeat _10___ times per set. Repeat on opposite side. Do _1___ sets per session. Do __2__ sessions per day.  http://orth.exer.us/824   Copyright  VHI. All rights reserved.  Strengthening: Resisted Extension   Hold tubing in right hand, arm forward. Pull arm back, elbow straight. Repeat _10___ times per set. Repeat on opposite side. Do __1__ sets per session. Do _2___ sessions per day.  http://orth.exer.us/832   Copyright  VHI. All rights reserved.

## 2015-10-23 ENCOUNTER — Ambulatory Visit
Admission: RE | Admit: 2015-10-23 | Discharge: 2015-10-23 | Disposition: A | Discharge status: Home / Self Care | Payer: Medicare Other | Source: Ambulatory Visit

## 2015-10-23 DIAGNOSIS — Z510 Encounter for antineoplastic radiation therapy: Secondary | ICD-10-CM | POA: Diagnosis not present

## 2015-10-24 ENCOUNTER — Ambulatory Visit
Admission: RE | Admit: 2015-10-24 | Discharge: 2015-10-24 | Disposition: A | Discharge status: Home / Self Care | Payer: Medicare Other | Source: Ambulatory Visit

## 2015-10-24 ENCOUNTER — Encounter: Payer: Self-pay | Admitting: Radiation Oncology

## 2015-10-24 ENCOUNTER — Ambulatory Visit
Admission: RE | Admit: 2015-10-24 | Discharge: 2015-10-24 | Disposition: A | Discharge status: Home / Self Care | Payer: Medicare Other | Source: Ambulatory Visit | Attending: Radiation Oncology | Admitting: Radiation Oncology

## 2015-10-24 VITALS — BP 140/82 | HR 76 | Temp 98.0°F | Ht 74.0 in | Wt 189.5 lb

## 2015-10-24 DIAGNOSIS — C799 Secondary malignant neoplasm of unspecified site: Secondary | ICD-10-CM

## 2015-10-24 DIAGNOSIS — IMO0002 Reserved for concepts with insufficient information to code with codable children: Secondary | ICD-10-CM

## 2015-10-24 DIAGNOSIS — Z510 Encounter for antineoplastic radiation therapy: Secondary | ICD-10-CM | POA: Diagnosis not present

## 2015-10-24 NOTE — Progress Notes (Signed)
  Radiation Oncology         [phone]   Name: Timothy Bradford MRN: [id number]   Date: 10/24/2015  DOB: 09/01/1935     Weekly Radiation Therapy Management    ICD-9-CM ICD-10-CM   1. Metastatic squamous cell carcinoma (HCC) 199.1 C79.9     C80.1     Current Dose: 34 Gy  Planned Dose:  60 Gy  Narrative The patient presents for routine under treatment assessment. Timothy Bradford has received 12 fractions to his left axilla. He denies any pain or fatigue. The patient is without complaint. Set-up films were reviewed. The chart was checked.  Physical Findings  height is 6\' 2"  (1.88 m) and weight is 189 lb 8 oz (86 kg). His temperature is 98 F (36.7 C). His blood pressure is 140/82 and his pulse is 76. . Weight essentially stable.  There was some minimal hyperpigmentation in the treatment field and no radiation dermatitis.  No desquamation of the skin noted.   Impression The patient is tolerating radiation.  Plan Continue treatment as planned.         Sheral Apley Tammi Klippel, M.D.  This document serves as a record of services personally performed by Tyler Pita, MD. It was created on his behalf by Truddie Hidden, a trained medical scribe. The creation of this record is based on the scribe's personal observations and the provider's statements to them. This document has been checked and approved by the attending provider.

## 2015-10-24 NOTE — Progress Notes (Signed)
Mr. Woodington denies any pain today.  He has received 17 fractions to his left axilla and has hyperpigmentation with intact skin. No other voiced concerns.

## 2015-10-27 ENCOUNTER — Ambulatory Visit: Payer: Medicare Other | Admitting: Physical Therapy

## 2015-10-27 ENCOUNTER — Encounter: Payer: Self-pay | Admitting: Physical Therapy

## 2015-10-27 ENCOUNTER — Ambulatory Visit
Admission: RE | Admit: 2015-10-27 | Discharge: 2015-10-27 | Disposition: A | Discharge status: Home / Self Care | Payer: Medicare Other | Source: Ambulatory Visit

## 2015-10-27 DIAGNOSIS — Z510 Encounter for antineoplastic radiation therapy: Secondary | ICD-10-CM | POA: Diagnosis not present

## 2015-10-27 DIAGNOSIS — I890 Lymphedema, not elsewhere classified: Secondary | ICD-10-CM

## 2015-10-27 DIAGNOSIS — M25512 Pain in left shoulder: Secondary | ICD-10-CM

## 2015-10-27 DIAGNOSIS — M25612 Stiffness of left shoulder, not elsewhere classified: Secondary | ICD-10-CM

## 2015-10-27 NOTE — Patient Instructions (Signed)
Over Head Pull: Narrow Grip     [place] [phone]   On back, knees bent, feet flat, band across thighs, elbows straight but relaxed. Pull hands apart (start). Keeping elbows straight, bring arms up and over head, hands toward floor. Keep pull steady on band. Hold momentarily. Return slowly, keeping pull steady, back to start. Repeat __10_ times. Band color __red ____   Side Pull: Double Arm   On back, knees bent, feet flat. Arms perpendicular to body, shoulder level, elbows straight but relaxed. Pull arms out to sides, elbows straight. Resistance band comes across collarbones, hands toward floor. Hold momentarily. Slowly return to starting position. Repeat _10__ times. Band color __red__   Sash   On back, knees bent, feet flat, left hand on left hip, right hand above left. Pull right arm DIAGONALLY (hip to shoulder) across chest. Bring right arm along head toward floor. Hold momentarily. Slowly return to starting position. Repeat _10__ times. Do with left arm. Band color __red____   Shoulder Rotation: Double Arm   On back, knees bent, feet flat, elbows tucked at sides, bent 90, hands palms up. Pull hands apart and down toward floor, keeping elbows near sides. Hold momentarily. Slowly return to starting position. Repeat _10__ times. Band color ___red___    

## 2015-10-27 NOTE — Therapy (Signed)
[place], [place], [address] Phone: [phone]   Fax:  [phone]  Physical Therapy Treatment  Patient Details  Name: Timothy Bradford MRN: [id number] Date of Birth: 12-Jan-1936 Referring Provider: Dr. Tammi Klippel  Encounter Date: 10/27/2015      PT End of Session - 10/27/15 1242    Visit Number 10   Number of Visits 17   Date for PT Re-Evaluation 11/14/15   PT Start Time 0940   PT Stop Time 1025   PT Time Calculation (min) 45 min   Activity Tolerance Patient tolerated treatment well   Behavior During Therapy [facility] for tasks assessed/performed      Past Medical History:  Diagnosis Date  . CHF 123XX123   Systolic failure. Cardiac cath in 2004: The LV showed mild global hypokinesia. EF of 45-50%. LVEDP was 12 mmHg. Left main was long which was patent. LAD has 10% distal stenosis. The vessel proximally was very tortuous. Diagonal one was small which was patent. Left circumflex was patent proximally and in mid portion and then tapers down in AV groove after giving off OM-2. OM-1 was large which was p  . Chronic venous insufficiency   . Erectile dysfunction   . Hyperlipidemia   . Hypertension   . Osteoarthritis   . Seborrheic keratoses    Multiple. Sees Lyndle Herrlich  . Seizures ([place]) 1999  . 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 AXILLARY LYMPH NODE DISSECTION Left 08/12/2015  . AXILLARY LYMPH NODE DISSECTION Left 08/12/2015   Procedure: LEFT AXILLARY LYMPH NODE DISSECTION;  Surgeon: Erroll Luna, MD;  Location: [place];  Service: General;  Laterality: Left;  . CATARACT EXTRACTION W/ INTRAOCULAR LENS  IMPLANT, BILATERAL Bilateral 2016  . EYE SURGERY     bilateral cataracts  . HERNIA REPAIR    . SKIN CANCER EXCISION     left shoulder 3 yrs ago    There were no vitals filed  for this visit.      Subjective Assessment - 10/27/15 0943    Subjective My exercises are going good. I have not gone yet to get measured for a compression sleeve.    Pertinent History Lymph nodes, regional resection, left axillary, NODULAR MASS OF SQUAMOUS CELL CARCINOMA INVOLVING THE DEEP DERMIS AND SUBCUTANEOUS SOFT tissue, - 5 lymph nodes removed all benign, pt to begin radiation therapy next week   Patient Stated Goals to be able to lift the arm   Currently in Pain? No/denies   Pain Score 0-No pain                         [facility] Adult PT Treatment/Exercise - 10/27/15 0001      Shoulder Exercises: Supine   Horizontal ABduction Strengthening;Both;10 reps;Theraband   Theraband Level (Shoulder Horizontal ABduction) Level 2 (Red)   External Rotation Strengthening;Both;10 reps;Theraband   Theraband Level (Shoulder External Rotation) Level 2 (Red)   Flexion Strengthening;Both;10 reps;Theraband   Theraband Level (Shoulder Flexion) Level 2 (Red)   Other Supine Exercises D2 x 10 reps bilaterally with red band  with max cueing to perform correctly     Shoulder Exercises: Standing   External Rotation Strengthening;Both;10 reps;Theraband   Theraband Level (Shoulder External Rotation) Level 2 (Red)   Internal Rotation Strengthening;10 reps;Theraband;Both   Theraband Level (Shoulder Internal  Rotation) Level 2 (Red)   Flexion Strengthening;Both;10 reps;Theraband   Theraband Level (Shoulder Flexion) Level 2 (Red)   Extension Strengthening;Both;10 reps;Theraband   Theraband Level (Shoulder Extension) Level 2 (Red)   Other Standing Exercises W arms x 10 reps each against wall with 1 lb weight     Shoulder Exercises: Pulleys   Flexion 2 minutes   ABduction 2 minutes     Manual Therapy   Edema Management Educated pt and his wife on various types of compression sleeves. measured pt for compression sleeve and glove                PT Education - 10/27/15 1246     Education provided Yes   Education Details various types of compression sleeves and gloves   Person(s) Educated Patient;Spouse   Methods Explanation   Comprehension Verbalized understanding           Short Term Clinic Goals - 10/15/15 1449      CC Short Term Goal  #3   Title Pt and spouse will be independent in verbalizing lymphedema risk reduction practices   Status On-going             Long Term Clinic Goals - 10/22/15 1000      CC Long Term Goal  #1   Title Pt will demonstrate 160 degrees of left shoulder flexion to allow pt to reach items overhead.    Baseline 108, 10/10/15- 144 degrees, 10/22/15 -149   Time 8   Period Weeks   Status On-going     CC Long Term Goal  #2   Title Pt will demonstrate 160 degrees of left shoulder abduction to allow pt to reach items out to sides.    Baseline 98, 10/10/15 - 108 - pt reports it is more stiff today, 10/22/15- 120   Time 8   Period Weeks   Status On-going     CC Long Term Goal  #3   Title Pt to be independent in a home exercise program for strengthening and stretching   Time 8   Period Weeks   Status On-going     CC Long Term Goal  #4   Title Pt will recieve an appropriate compression garment for long term management of edema   Baseline Pt to be measured in the next few days 10/10/15, 10/22/15- pt states he will be measured this week   Time 8   Period Weeks   Status On-going     CC Long Term Goal  #5   Title Pt and/or spouse will be independent in self drainage technique for long term management of edema.    Time 8   Period Weeks   Status Achieved            Plan - 10/27/15 1243    Clinical Impression Statement Increased resistance today on standing and supine theraband exercises. Pt is demonstrating increasing independence with exercises and supine scapular exercises were given to pt to add to HEP. He still has not obtained a compression sleeve and glove. He was measured today for an [employer] compression sleeve and  glove and given information to purchase online.    Rehab Potential Good   Clinical Impairments Affecting Rehab Potential pt's wife states he has poor memory, have pt's wife in room to educate   PT Frequency 2x / week   PT Duration 8 weeks   PT Treatment/Interventions Passive range of motion;Taping;Manual techniques;Manual lymph drainage;Vasopneumatic Device;Therapeutic exercise;Therapeutic activities;Patient/family education;DME Instruction;Scar mobilization;Compression bandaging;ADLs/Self  Care Home Management   PT Next Visit Plan assess if pt ordered sleeve and glove, assess wife's indep with MLD, continue AROM/AAROM/PROM to shoulders   PT Home Exercise Plan supine dowel ROM exercises, ABC class stretching exercises, supine scapular exercises   Consulted and Agree with Plan of Care Patient      Patient will benefit from skilled therapeutic intervention in order to improve the following deficits and impairments:  Decreased range of motion, Decreased strength, Increased fascial restricitons, Impaired UE functional use, Pain, Decreased knowledge of precautions, Decreased knowledge of use of DME  Visit Diagnosis: Stiffness of left shoulder, not elsewhere classified  Pain in left shoulder  Lymphedema, not elsewhere classified     Problem List Patient Active Problem List   Diagnosis Date Noted  . Axillary mass 08/12/2015  . Metastatic squamous cell carcinoma ([place]) 06/20/2015  . Mass of left axilla 05/22/2015  . Encounter for immunization 12/27/2014  . Chronic venous insufficiency   . H/O hyperglycemia 04/10/2014  . Gout 07/25/2013  . Anemia 07/25/2013  . Swelling of joint, wrist, left 07/13/2013  . Seborrheic keratoses, inflamed 10/26/2012  . Health care maintenance 10/26/2012  . Chronic diastolic congestive heart failure ([place]) 12/26/2008  . OSTEOARTHRITIS 05/27/2006  . Dyslipidemia 04/26/2006  . ERECTILE DYSFUNCTION 02/09/2006  . Essential hypertension 02/09/2006    Alexia Freestone 10/27/2015, 12:47 PM  [place], [place], [address] Phone: [phone]   Fax:  [phone]  Name: Graeson Lindstrom MRN: [id number] Date of Birth: December 09, 1935   Allyson Sabal, PT 10/27/15 12:47 PM

## 2015-10-28 ENCOUNTER — Ambulatory Visit
Admission: RE | Admit: 2015-10-28 | Discharge: 2015-10-28 | Disposition: A | Discharge status: Home / Self Care | Payer: Medicare Other | Source: Ambulatory Visit

## 2015-10-28 DIAGNOSIS — Z510 Encounter for antineoplastic radiation therapy: Secondary | ICD-10-CM | POA: Diagnosis not present

## 2015-10-29 ENCOUNTER — Ambulatory Visit: Payer: Medicare Other

## 2015-10-29 ENCOUNTER — Ambulatory Visit
Admission: RE | Admit: 2015-10-29 | Discharge: 2015-10-29 | Disposition: A | Discharge status: Home / Self Care | Payer: Medicare Other | Source: Ambulatory Visit

## 2015-10-29 DIAGNOSIS — M25612 Stiffness of left shoulder, not elsewhere classified: Secondary | ICD-10-CM | POA: Diagnosis not present

## 2015-10-29 DIAGNOSIS — I890 Lymphedema, not elsewhere classified: Secondary | ICD-10-CM

## 2015-10-29 DIAGNOSIS — M25512 Pain in left shoulder: Secondary | ICD-10-CM

## 2015-10-29 DIAGNOSIS — Z510 Encounter for antineoplastic radiation therapy: Secondary | ICD-10-CM | POA: Diagnosis not present

## 2015-10-29 NOTE — Therapy (Addendum)
[place], [place], [address] Phone: [phone]   Fax:  [phone]  Physical Therapy Treatment  Patient Details  Name: Timothy Bradford MRN: [id number] Date of Birth: 08/24/1935 Referring Provider: Dr. Tammi Klippel  Encounter Date: 10/29/2015      PT End of Session - 10/29/15 1029    Visit Number 11   Number of Visits 17   Date for PT Re-Evaluation 11/14/15   PT Start Time 0940   PT Stop Time 1018   PT Time Calculation (min) 38 min   Activity Tolerance Patient tolerated treatment well   Behavior During Therapy [facility] for tasks assessed/performed      Past Medical History:  Diagnosis Date  . CHF 6294   Systolic failure. Cardiac cath in 2004: The LV showed mild global hypokinesia. EF of 45-50%. LVEDP was 12 mmHg. Left main was long which was patent. LAD has 10% distal stenosis. The vessel proximally was very tortuous. Diagonal one was small which was patent. Left circumflex was patent proximally and in mid portion and then tapers down in AV groove after giving off OM-2. OM-1 was large which was p  . Chronic venous insufficiency   . Erectile dysfunction   . Hyperlipidemia   . Hypertension   . Osteoarthritis   . Seborrheic keratoses    Multiple. Sees Lyndle Herrlich  . Seizures ([place]) 1999  . 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 AXILLARY LYMPH NODE DISSECTION Left 08/12/2015  . AXILLARY LYMPH NODE DISSECTION Left 08/12/2015   Procedure: LEFT AXILLARY LYMPH NODE DISSECTION;  Surgeon: Erroll Luna, MD;  Location: [place];  Service: General;  Laterality: Left;  . CATARACT EXTRACTION W/ INTRAOCULAR LENS  IMPLANT, BILATERAL Bilateral 2016  . EYE SURGERY     bilateral cataracts  . HERNIA REPAIR    . SKIN CANCER EXCISION     left shoulder 3 yrs ago    There were no vitals filed  for this visit.      Subjective Assessment - 10/29/15 0944    Subjective I ordered my sleeve and glove and it should arrive by the third of August. I feel like my wife and I are doing good with the exercises, I might even do better than her!    Pertinent History Lymph nodes, regional resection, left axillary, NODULAR MASS OF SQUAMOUS CELL CARCINOMA INVOLVING THE DEEP DERMIS AND SUBCUTANEOUS SOFT tissue, - 5 lymph nodes removed all benign, pt to begin radiation therapy next week   Patient Stated Goals to be able to lift the arm   Currently in Pain? No/denies            [facility] PT Assessment - 10/29/15 0001      AROM   Left Shoulder Flexion 130 Degrees   Left Shoulder ABduction 121 Degrees                     [facility] Adult PT Treatment/Exercise - 10/29/15 0001      Manual Therapy   Manual Lymphatic Drainage (MLD) Reviewed with pt as follows: diaphragmatic breathing, short neck, right axilla and anterior interaxillary anastomosis, left groin and axillo-inguinal anastomosis, and left UE from dorsal hand to shoulder, all in supine.   Passive ROM In supine to left shoulder for ER, abduction, and flexion; left elbow into extension  PT Education - 10/29/15 1028    Education provided Yes   Education Details Instructed pt how he can make pulleys at home to help him with his AA/ROM to use an over-the-door wreath hanger and jump rope.    Person(s) Educated Patient   Methods Explanation;Demonstration   Comprehension Verbalized understanding           Short Term Clinic Goals - 10/15/15 1449      CC Short Term Goal  #3   Title Pt and spouse will be independent in verbalizing lymphedema risk reduction practices   Status On-going             Long Term Clinic Goals - 10/29/15 1036      CC Long Term Goal  #1   Title Pt will demonstrate 160 degrees of left shoulder flexion to allow pt to reach items overhead.    Baseline 108, 10/10/15- 144 degrees,  10/22/15 -149, 130 degrees 10/29/15 (pt struggled with compensations today)   Status On-going     CC Long Term Goal  #2   Title Pt will demonstrate 160 degrees of left shoulder abduction to allow pt to reach items out to sides.    Baseline 98, 10/10/15 - 108 - pt reports it is more stiff today, 10/22/15- 120, 121 degrees 10/29/15   Status On-going     CC Long Term Goal  #3   Title Pt to be independent in a home exercise program for strengthening and stretching   Status Partially Met     CC Long Term Goal  #4   Title Pt will recieve an appropriate compression garment for long term management of edema   Baseline Pt to be measured in the next few days 10/10/15, 10/22/15- pt states he will be measured this week, pt reports this ordered and to arrive 11/06/15   Status Partially Met     CC Long Term Goal  #5   Title Pt and/or spouse will be independent in self drainage technique for long term management of edema.    Status Achieved            Plan - 10/29/15 1031    Clinical Impression Statement Pt overall is doing very well with his compliance of HEP and self MLD. He reports his arm swelling has been well managed with self MLD and has ordered his sleeve and glove that should arrive by 11/06/15. He is going to incorporate pulley stretching into his HEP as he reports the cane is a little more difficult as his motion has improved. His Lt UE flexion was decreased from last visit but he has alot oftrouble with decreasing scapular compensations, also showed him how to work on this in front of the mirror for visual cuing.     Rehab Potential Good   Clinical Impairments Affecting Rehab Potential pt's wife states he has poor memory, have pt's wife in room to educate   PT Frequency 2x / week   PT Duration 8 weeks   PT Treatment/Interventions Passive range of motion;Taping;Manual techniques;Manual lymph drainage;Vasopneumatic Device;Therapeutic exercise;Therapeutic activities;Patient/family education;DME  Instruction;Scar mobilization;Compression bandaging;ADLs/Self Care Home Management   PT Next Visit Plan assess wife's indep with MLD, continue AROM/AAROM/PROM to shoulders   Consulted and Agree with Plan of Care Patient      Patient will benefit from skilled therapeutic intervention in order to improve the following deficits and impairments:  Decreased range of motion, Decreased strength, Increased fascial restricitons, Impaired UE functional use, Pain, Decreased knowledge of  precautions, Decreased knowledge of use of DME  Visit Diagnosis: Stiffness of left shoulder, not elsewhere classified  Pain in left shoulder  Lymphedema, not elsewhere classified     Problem List Patient Active Problem List   Diagnosis Date Noted  . Axillary mass 08/12/2015  . Metastatic squamous cell carcinoma ([place]) 06/20/2015  . Mass of left axilla 05/22/2015  . Encounter for immunization 12/27/2014  . Chronic venous insufficiency   . H/O hyperglycemia 04/10/2014  . Gout 07/25/2013  . Anemia 07/25/2013  . Swelling of joint, wrist, left 07/13/2013  . Seborrheic keratoses, inflamed 10/26/2012  . Health care maintenance 10/26/2012  . Chronic diastolic congestive heart failure ([place]) 12/26/2008  . OSTEOARTHRITIS 05/27/2006  . Dyslipidemia 04/26/2006  . ERECTILE DYSFUNCTION 02/09/2006  . Essential hypertension 02/09/2006    Otelia Limes, PTA 10/29/2015, 10:39 AM  [place], [place], [address] Phone: [phone]   Fax:  [phone]  Name: Keenan Trefry MRN: [id number] Date of Birth: 1935/10/03  PHYSICAL THERAPY DISCHARGE SUMMARY  Visits from Start of Care: 11  Current functional level related to goals / functional outcomes: See above, pt partially met goals   Remaining deficits: Decreased shoulder ROM   Education / Equipment: HEP  Plan: Patient agrees to discharge.  Patient goals were not met. Patient  is being discharged due to not returning since the last visit.  ?????    Allyson Sabal, PT 03/04/16 12:57 PM

## 2015-10-30 ENCOUNTER — Ambulatory Visit
Admission: RE | Admit: 2015-10-30 | Discharge: 2015-10-30 | Disposition: A | Discharge status: Home / Self Care | Payer: Medicare Other | Source: Ambulatory Visit

## 2015-10-30 DIAGNOSIS — Z510 Encounter for antineoplastic radiation therapy: Secondary | ICD-10-CM | POA: Diagnosis not present

## 2015-10-31 ENCOUNTER — Ambulatory Visit
Admission: RE | Admit: 2015-10-31 | Discharge: 2015-10-31 | Disposition: A | Discharge status: Home / Self Care | Payer: Medicare Other | Source: Ambulatory Visit

## 2015-10-31 ENCOUNTER — Ambulatory Visit
Admission: RE | Admit: 2015-10-31 | Discharge: 2015-10-31 | Disposition: A | Discharge status: Home / Self Care | Payer: Medicare Other | Source: Ambulatory Visit | Attending: Radiation Oncology | Admitting: Radiation Oncology

## 2015-10-31 VITALS — BP 157/77 | HR 76 | Resp 16 | Wt 188.4 lb

## 2015-10-31 DIAGNOSIS — Z510 Encounter for antineoplastic radiation therapy: Secondary | ICD-10-CM | POA: Diagnosis not present

## 2015-10-31 DIAGNOSIS — C799 Secondary malignant neoplasm of unspecified site: Secondary | ICD-10-CM

## 2015-10-31 DIAGNOSIS — IMO0002 Reserved for concepts with insufficient information to code with codable children: Secondary | ICD-10-CM

## 2015-10-31 MED ORDER — RADIAPLEXRX EX GEL
Freq: Once | CUTANEOUS | Status: AC
  Administered 2015-10-31: 12:00:00 via TOPICAL

## 2015-10-31 NOTE — Progress Notes (Signed)
Weight and vitals stable. Denies pain. Hyperpigmentation of left axilla without desquamation noted. Reports using radiaplex bid as directed. Denies fatigue. Lymphedema of left arm continues but, is no worse. Wife reports sleeve and glove will be ordered next week. Reports they live on a fixed income and had to have his two teeth pulled this week. Provided patient with another tube of radiaplex..  BP (!) 157/77   Pulse 76   Resp 16   Wt 188 lb 6.4 oz (85.5 kg)   SpO2 100%   BMI 24.19 kg/m  Wt Readings from Last 3 Encounters:  10/31/15 188 lb 6.4 oz (85.5 kg)  10/24/15 189 lb 8 oz (86 kg)  10/17/15 187 lb 9.6 oz (85.1 kg)

## 2015-10-31 NOTE — Progress Notes (Signed)
  Radiation Oncology         [phone]   Name: Timothy Bradford MRN: [id number]   Date: 10/31/2015  DOB: [birth year]     Weekly Radiation Therapy Management    ICD-9-CM ICD-10-CM   1. Metastatic squamous cell carcinoma (HCC) 199.1 C79.9 hyaluronate sodium (RADIAPLEXRX) gel    C80.1     Current Dose: 44 Gy  Planned Dose:  60 Gy  Narrative The patient presents for routine under treatment assessment. Denies pain. Hyperpigmentation of left axilla without desquamation noted. Reports using radiaplex bid as directed. Denies fatigue. Lymphedema of left arm continues but, is no worse. Wife reports sleeve and glove will be ordered next week. Reports they live on a fixed income and had to have his two teeth pulled this week. Provided patient with another tube of radiaplex.Marland Kitchen  He denies any pain or fatigue. The patient is without complaint. Set-up films were reviewed. The chart was checked.  Physical Findings  weight is 188 lb 6.4 oz (85.5 kg). His blood pressure is 157/77 (abnormal) and his pulse is 76. His respiration is 16 and oxygen saturation is 100%. . Weight essentially stable.  There is some hyperpigmentation but no peeling.   Impression The patient is tolerating radiation.  Plan Continue treatment as planned.         Sheral Apley Tammi Klippel, M.D.  This document serves as a record of services personally performed by Tyler Pita, MD. It was created on his behalf by Truddie Hidden, a trained medical scribe. The creation of this record is based on the scribe's personal observations and the provider's statements to them. This document has been checked and approved by the attending provider.

## 2015-11-03 ENCOUNTER — Ambulatory Visit
Admission: RE | Admit: 2015-11-03 | Discharge: 2015-11-03 | Disposition: A | Discharge status: Home / Self Care | Payer: Medicare Other | Source: Ambulatory Visit

## 2015-11-03 ENCOUNTER — Ambulatory Visit: Payer: Medicare Other | Admitting: Physical Therapy

## 2015-11-03 DIAGNOSIS — M25612 Stiffness of left shoulder, not elsewhere classified: Secondary | ICD-10-CM | POA: Diagnosis not present

## 2015-11-03 DIAGNOSIS — Z510 Encounter for antineoplastic radiation therapy: Secondary | ICD-10-CM | POA: Diagnosis not present

## 2015-11-03 DIAGNOSIS — I890 Lymphedema, not elsewhere classified: Secondary | ICD-10-CM

## 2015-11-03 DIAGNOSIS — M25512 Pain in left shoulder: Secondary | ICD-10-CM

## 2015-11-03 NOTE — Therapy (Signed)
[place], [place], [address] Phone: [phone]   Fax:  [phone]  Physical Therapy Treatment  Patient Details  Name: Timothy Bradford MRN: [id number] Date of Birth: 03-31-1936 Referring Provider: Dr. Tammi Klippel  Encounter Date: 11/03/2015      PT End of Session - 11/03/15 2153    Visit Number 12   Number of Visits 17   Date for PT Re-Evaluation 11/14/15   PT Start Time 0937   PT Stop Time 1017   PT Time Calculation (min) 40 min   Activity Tolerance Patient tolerated treatment well   Behavior During Therapy [facility] for tasks assessed/performed      Past Medical History:  Diagnosis Date  . CHF 6945   Systolic failure. Cardiac cath in 2004: The LV showed mild global hypokinesia. EF of 45-50%. LVEDP was 12 mmHg. Left main was long which was patent. LAD has 10% distal stenosis. The vessel proximally was very tortuous. Diagonal one was small which was patent. Left circumflex was patent proximally and in mid portion and then tapers down in AV groove after giving off OM-2. OM-1 was large which was p  . Chronic venous insufficiency   . Erectile dysfunction   . Hyperlipidemia   . Hypertension   . Osteoarthritis   . Seborrheic keratoses    Multiple. Sees Lyndle Herrlich  . Seizures ([place]) 1999  . 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 AXILLARY LYMPH NODE DISSECTION Left 08/12/2015  . AXILLARY LYMPH NODE DISSECTION Left 08/12/2015   Procedure: LEFT AXILLARY LYMPH NODE DISSECTION;  Surgeon: Erroll Luna, MD;  Location: [facility];  Service: General;  Laterality: Left;  . CATARACT EXTRACTION W/ INTRAOCULAR LENS  IMPLANT, BILATERAL Bilateral 2016  . EYE SURGERY     bilateral cataracts  . HERNIA REPAIR    . SKIN CANCER EXCISION     left shoulder 3 yrs ago    There were no vitals filed  for this visit.      Subjective Assessment - 11/03/15 0939    Subjective Wife hasn't been able to do manual lymph drainage at home, she says, because she hurt her arm a few weeks ago and it hasn't gotten better.  Patient feels good about doing his home exercises.  Patient says he can use his arm fairly normally now, lifting things without pain.   Currently in Pain? No/denies                         [facility] Adult PT Treatment/Exercise - 11/03/15 0001      Shoulder Exercises: Seated   Other Seated Exercises at end of session, shoulder rolls backward, active bilateral shoulder abduction and flexion x 10     Manual Therapy   Manual Therapy Scapular mobilization   Myofascial Release with one hand on left upper arm and other on left flank or on left chest, stretching in opposite directions; left UE myofascial pulling in supine; also in right sidelying   Scapular Mobilization In right sidelying to left scapula with movement into protraction at varying angles of flexion and into depression   Passive ROM In supine to left shoulder for ER, abduction, and flexion; left elbow into extension; left shoulder into horizontal abduction  Short Term Clinic Goals - 10/15/15 1449      CC Short Term Goal  #3   Title Pt and spouse will be independent in verbalizing lymphedema risk reduction practices   Status On-going             Long Term Clinic Goals - 10/29/15 1036      CC Long Term Goal  #1   Title Pt will demonstrate 160 degrees of left shoulder flexion to allow pt to reach items overhead.    Baseline 108, 10/10/15- 144 degrees, 10/22/15 -149, 130 degrees 10/29/15 (pt struggled with compensations today)   Status On-going     CC Long Term Goal  #2   Title Pt will demonstrate 160 degrees of left shoulder abduction to allow pt to reach items out to sides.    Baseline 98, 10/10/15 - 108 - pt reports it is more stiff today, 10/22/15- 120, 121 degrees 10/29/15    Status On-going     CC Long Term Goal  #3   Title Pt to be independent in a home exercise program for strengthening and stretching   Status Partially Met     CC Long Term Goal  #4   Title Pt will recieve an appropriate compression garment for long term management of edema   Baseline Pt to be measured in the next few days 10/10/15, 10/22/15- pt states he will be measured this week, pt reports this ordered and to arrive 11/06/15   Status Partially Met     CC Long Term Goal  #5   Title Pt and/or spouse will be independent in self drainage technique for long term management of edema.    Status Achieved            Plan - 11/03/15 2153    Clinical Impression Statement Patient did well tolerating stretches today, though he does continue to have tightness in the left shoulder area.  Mild swelling continues in left UE.  Waiting for his compression sleeve, which has been ordered.   Rehab Potential Good   Clinical Impairments Affecting Rehab Potential pt's wife states he has poor memory, have pt's wife in room to educate   PT Frequency 2x / week   PT Duration 8 weeks   PT Treatment/Interventions Passive range of motion;Taping;Manual techniques;Manual lymph drainage;Vasopneumatic Device;Therapeutic exercise;Therapeutic activities;Patient/family education;DME Instruction;Scar mobilization;Compression bandaging;ADLs/Self Care Home Management   PT Next Visit Plan remeasure; continue AROM/AAROM/PROM to left shoulder   PT Home Exercise Plan supine dowel ROM exercises, ABC class stretching exercises, supine scapular exercises   Consulted and Agree with Plan of Care Patient      Patient will benefit from skilled therapeutic intervention in order to improve the following deficits and impairments:  Decreased range of motion, Decreased strength, Increased fascial restricitons, Impaired UE functional use, Pain, Decreased knowledge of precautions, Decreased knowledge of use of DME  Visit  Diagnosis: Stiffness of left shoulder, not elsewhere classified  Pain in left shoulder  Lymphedema, not elsewhere classified     Problem List Patient Active Problem List   Diagnosis Date Noted  . Axillary mass 08/12/2015  . Metastatic squamous cell carcinoma ([place]) 06/20/2015  . Mass of left axilla 05/22/2015  . Encounter for immunization 12/27/2014  . Chronic venous insufficiency   . H/O hyperglycemia 04/10/2014  . Gout 07/25/2013  . Anemia 07/25/2013  . Swelling of joint, wrist, left 07/13/2013  . Seborrheic keratoses, inflamed 10/26/2012  . Health care maintenance 10/26/2012  . Chronic diastolic congestive heart failure (  [place]) 12/26/2008  . OSTEOARTHRITIS 05/27/2006  . Dyslipidemia 04/26/2006  . ERECTILE DYSFUNCTION 02/09/2006  . Essential hypertension 02/09/2006    Jordie Skalsky 11/03/2015, 9:58 PM  [place], [place], [address] Phone: [phone]   Fax:  [phone]  Name: Timothy Bradford MRN: [id number] Date of Birth: Oct 17, 1935   Serafina Royals, PT 11/03/15 9:58 PM

## 2015-11-04 ENCOUNTER — Ambulatory Visit
Admission: RE | Admit: 2015-11-04 | Discharge: 2015-11-04 | Disposition: A | Discharge status: Home / Self Care | Payer: Medicare Other | Source: Ambulatory Visit | Attending: Radiation Oncology | Admitting: Radiation Oncology

## 2015-11-04 DIAGNOSIS — Z923 Personal history of irradiation: Secondary | ICD-10-CM | POA: Diagnosis not present

## 2015-11-04 DIAGNOSIS — C779 Secondary and unspecified malignant neoplasm of lymph node, unspecified: Secondary | ICD-10-CM | POA: Insufficient documentation

## 2015-11-05 ENCOUNTER — Encounter: Payer: Self-pay | Admitting: Physical Therapy

## 2015-11-05 ENCOUNTER — Ambulatory Visit: Payer: Medicare Other | Attending: Radiation Oncology | Admitting: Physical Therapy

## 2015-11-05 ENCOUNTER — Ambulatory Visit
Admission: RE | Admit: 2015-11-05 | Discharge: 2015-11-05 | Disposition: A | Discharge status: Home / Self Care | Payer: Medicare Other | Source: Ambulatory Visit

## 2015-11-05 DIAGNOSIS — M25512 Pain in left shoulder: Secondary | ICD-10-CM | POA: Insufficient documentation

## 2015-11-05 DIAGNOSIS — C779 Secondary and unspecified malignant neoplasm of lymph node, unspecified: Secondary | ICD-10-CM | POA: Diagnosis not present

## 2015-11-05 DIAGNOSIS — M25612 Stiffness of left shoulder, not elsewhere classified: Secondary | ICD-10-CM | POA: Insufficient documentation

## 2015-11-05 DIAGNOSIS — I890 Lymphedema, not elsewhere classified: Secondary | ICD-10-CM | POA: Insufficient documentation

## 2015-11-05 NOTE — Therapy (Signed)
[place], [place], [address] Phone: [phone]   Fax:  [phone]  Physical Therapy Treatment  Patient Details  Name: Timothy Bradford MRN: [id number] Date of Birth: 1936/03/06 Referring Provider: Dr. Tammi Klippel  Encounter Date: 11/05/2015      PT End of Session - 11/05/15 1200    Visit Number 13   Number of Visits 17   Date for PT Re-Evaluation 11/14/15   PT Start Time 0937   PT Stop Time 1017   PT Time Calculation (min) 40 min   Activity Tolerance Patient tolerated treatment well   Behavior During Therapy [facility] for tasks assessed/performed      Past Medical History:  Diagnosis Date  . CHF 3382   Systolic failure. Cardiac cath in 2004: The LV showed mild global hypokinesia. EF of 45-50%. LVEDP was 12 mmHg. Left main was long which was patent. LAD has 10% distal stenosis. The vessel proximally was very tortuous. Diagonal one was small which was patent. Left circumflex was patent proximally and in mid portion and then tapers down in AV groove after giving off OM-2. OM-1 was large which was p  . Chronic venous insufficiency   . Erectile dysfunction   . Hyperlipidemia   . Hypertension   . Osteoarthritis   . Seborrheic keratoses    Multiple. Sees Lyndle Herrlich  . Seizures ([place]) 1999  . 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 AXILLARY LYMPH NODE DISSECTION Left 08/12/2015  . AXILLARY LYMPH NODE DISSECTION Left 08/12/2015   Procedure: LEFT AXILLARY LYMPH NODE DISSECTION;  Surgeon: Erroll Luna, MD;  Location: [facility];  Service: General;  Laterality: Left;  . CATARACT EXTRACTION W/ INTRAOCULAR LENS  IMPLANT, BILATERAL Bilateral 2016  . EYE SURGERY     bilateral cataracts  . HERNIA REPAIR    . SKIN CANCER EXCISION     left shoulder 3 yrs ago    There were no vitals filed for  this visit.      Subjective Assessment - 11/05/15 0939    Subjective My exercises are going fine. It seems like my arm went down some.    Pertinent History Lymph nodes, regional resection, left axillary, NODULAR MASS OF SQUAMOUS CELL CARCINOMA INVOLVING THE DEEP DERMIS AND SUBCUTANEOUS SOFT tissue, - 5 lymph nodes removed all benign, pt to begin radiation therapy next week   Patient Stated Goals to be able to lift the arm   Currently in Pain? No/denies   Pain Score 0-No pain               LYMPHEDEMA/ONCOLOGY QUESTIONNAIRE - 11/05/15 1014      Left Upper Extremity Lymphedema   15 cm Proximal to Olecranon Process 26.4 cm   Olecranon Process 27.1 cm   15 cm Proximal to Ulnar Styloid Process 22.7 cm   10 cm Proximal to Ulnar Styloid Process 19.4 cm   Just Proximal to Ulnar Styloid Process 17.7 cm   Across Hand at [employer] 21.1 cm   At [place] of 2nd Digit 7 cm                  [facility] Adult PT Treatment/Exercise - 11/05/15 0001      Shoulder Exercises: Pulleys   Flexion 2 minutes   ABduction 2 minutes     Manual Therapy  Manual Therapy Scapular mobilization   Scapular Mobilization In right sidelying to left scapula with movement into protraction at varying angles of flexion and into depression   Passive ROM In supine to left shoulder for ER, abduction, and flexion; left elbow into extension;                   Short Term Clinic Goals - 11/05/15 0948      CC Short Term Goal  #1   Title Pt will demonstrate increased left shoulder flexion of 130 degrees to improve ability to reach items on shelf.    Baseline 108, 10/03/15- 140 degrees   Status Achieved     CC Short Term Goal  #2   Title Pt to demonstrate increased left shoulder abduction of 120 degrees to allow pt to reach items out to sides.   Baseline 98, 10/03/15 - 120 degrees   Status Achieved     CC Short Term Goal  #3   Title Pt and spouse will be independent in verbalizing lymphedema risk  reduction practices   Baseline 10/10/15- Pt able to verbalize most precautions but not heat to LUE, 11/05/15- pt able to with some verbal cueings   Status Achieved             Long Term Clinic Goals - 11/05/15 0949      CC Long Term Goal  #1   Title Pt will demonstrate 160 degrees of left shoulder flexion to allow pt to reach items overhead.    Baseline 108, 10/10/15- 144 degrees, 10/22/15 -149, 130 degrees 10/29/15 (pt struggled with compensations today), 11/05/15- 165 degrees   Status Achieved     CC Long Term Goal  #2   Title Pt will demonstrate 160 degrees of left shoulder abduction to allow pt to reach items out to sides.    Baseline 98, 10/10/15 - 108 - pt reports it is more stiff today, 10/22/15- 120, 121 degrees 10/29/15, 11/05/15- 135 degrees   Time 8   Period Weeks   Status On-going     CC Long Term Goal  #3   Title Pt to be independent in a home exercise program for strengthening and stretching   Time 8   Period Weeks   Status Partially Met     CC Long Term Goal  #4   Title Pt will recieve an appropriate compression garment for long term management of edema   Baseline Pt to be measured in the next few days 10/10/15, 10/22/15- pt states he will be measured this week, pt reports this ordered and to arrive 11/06/15   Time 8   Period Weeks   Status Partially Met     CC Long Term Goal  #5   Title Pt and/or spouse will be independent in self drainage technique for long term management of edema.    Status Achieved            Plan - 11/05/15 1201    Clinical Impression Statement Pt met several of his therapy goals today. He met his goal for left shoulder flexion and has made progress towards his left shoulder abduction ROM goal. He is to receive his compression sleeve in the next week. Once pt meets his goals he will be ready for discharge from skilled PT services.    Rehab Potential Good   Clinical Impairments Affecting Rehab Potential pt's wife states he has poor memory, have pt's  wife in room to educate   PT Frequency 2x /   week   PT Duration 8 weeks   PT Treatment/Interventions Passive range of motion;Taping;Manual techniques;Manual lymph drainage;Vasopneumatic Device;Therapeutic exercise;Therapeutic activities;Patient/family education;DME Instruction;Scar mobilization;Compression bandaging;ADLs/Self Care Home Management   PT Next Visit Plan continue AROM/AAROM/PROM to left shoulder   PT Home Exercise Plan supine dowel ROM exercises, ABC class stretching exercises, supine scapular exercises   Consulted and Agree with Plan of Care Patient      Patient will benefit from skilled therapeutic intervention in order to improve the following deficits and impairments:  Decreased range of motion, Decreased strength, Increased fascial restricitons, Impaired UE functional use, Pain, Decreased knowledge of precautions, Decreased knowledge of use of DME  Visit Diagnosis: Stiffness of left shoulder, not elsewhere classified  Pain in left shoulder     Problem List Patient Active Problem List   Diagnosis Date Noted  . Axillary mass 08/12/2015  . Metastatic squamous cell carcinoma ([place]) 06/20/2015  . Mass of left axilla 05/22/2015  . Encounter for immunization 12/27/2014  . Chronic venous insufficiency   . H/O hyperglycemia 04/10/2014  . Gout 07/25/2013  . Anemia 07/25/2013  . Swelling of joint, wrist, left 07/13/2013  . Seborrheic keratoses, inflamed 10/26/2012  . Health care maintenance 10/26/2012  . Chronic diastolic congestive heart failure ([place]) 12/26/2008  . OSTEOARTHRITIS 05/27/2006  . Dyslipidemia 04/26/2006  . ERECTILE DYSFUNCTION 02/09/2006  . Essential hypertension 02/09/2006    Alexia Freestone 11/05/2015, 12:04 PM  [place], [place], [address] Phone: [phone]   Fax:  [phone]  Name: Timothy Bradford MRN: [id number] Date of Birth: Jun 04, 1935   Allyson Sabal,  PT 11/05/15 12:04 PM

## 2015-11-06 ENCOUNTER — Ambulatory Visit
Admission: RE | Admit: 2015-11-06 | Discharge: 2015-11-06 | Disposition: A | Discharge status: Home / Self Care | Payer: Medicare Other | Source: Ambulatory Visit

## 2015-11-06 DIAGNOSIS — C779 Secondary and unspecified malignant neoplasm of lymph node, unspecified: Secondary | ICD-10-CM | POA: Diagnosis not present

## 2015-11-07 ENCOUNTER — Ambulatory Visit
Admission: RE | Admit: 2015-11-07 | Discharge: 2015-11-07 | Disposition: A | Discharge status: Home / Self Care | Payer: Medicare Other | Source: Ambulatory Visit | Attending: Radiation Oncology | Admitting: Radiation Oncology

## 2015-11-07 ENCOUNTER — Ambulatory Visit
Admission: RE | Admit: 2015-11-07 | Discharge: 2015-11-07 | Disposition: A | Discharge status: Home / Self Care | Payer: Medicare Other | Source: Ambulatory Visit

## 2015-11-07 VITALS — BP 158/78 | HR 67 | Resp 16 | Wt 194.1 lb

## 2015-11-07 DIAGNOSIS — C799 Secondary malignant neoplasm of unspecified site: Secondary | ICD-10-CM

## 2015-11-07 DIAGNOSIS — C779 Secondary and unspecified malignant neoplasm of lymph node, unspecified: Secondary | ICD-10-CM | POA: Diagnosis not present

## 2015-11-07 DIAGNOSIS — IMO0002 Reserved for concepts with insufficient information to code with codable children: Secondary | ICD-10-CM

## 2015-11-07 NOTE — Progress Notes (Signed)
Weight and vitals stable. Denies pain. Hyperpigmentation of left axilla without desquamation noted. Reports using radiaplex bid as directed. Denies fatigue. Lymphedema of left arm continues but, is no worse. Left arm lymphedema sleeve and glove have been ordered but, haven't arrived yet. Patient denies fatigue.   BP (!) 158/78   Pulse 67   Resp 16   Wt 194 lb 1.6 oz (88 kg)   SpO2 100%   BMI 24.92 kg/m  Wt Readings from Last 3 Encounters:  11/07/15 194 lb 1.6 oz (88 kg)  10/31/15 188 lb 6.4 oz (85.5 kg)  10/24/15 189 lb 8 oz (86 kg)

## 2015-11-07 NOTE — Progress Notes (Signed)
  Radiation Oncology         [phone]   Name: Timothy Bradford MRN: [id number]   Date: 11/07/2015  DOB: 07/17/1935     Weekly Radiation Therapy Management    ICD-9-CM ICD-10-CM   1. Metastatic squamous cell carcinoma (HCC) 199.1 C79.9     C80.1     Current Dose: 54 Gy  Planned Dose:  60 Gy  Narrative The patient presents for routine under treatment assessment.  Weight and vitals stable. Denies pain. Hyperpigmentation of left axilla without desquamation noted. Reports using radiaplex bid as directed. Denies fatigue. Lymphedema of left arm continues but, is no worse. Left arm lymphedema sleeve and glove have been ordered but, haven't arrived yet. Patient denies fatigue.   The patient is without complaint. Set-up films were reviewed. The chart was checked.  Physical Findings  weight is 194 lb 1.6 oz (88 kg). His blood pressure is 158/78 (abnormal) and his pulse is 67. His respiration is 16 and oxygen saturation is 100%. . Weight essentially stable. There is some hyperpigmentation but no peeling.   Impression The patient is tolerating radiation.  Plan Continue treatment as planned. He finishes treatment next Wednesday and will follow up with me in a month.         Sheral Apley Tammi Klippel, M.D.    This document serves as a record of services personally performed by Tyler Pita, MD. It was created on his behalf by Lendon Collar, a trained medical scribe. The creation of this record is based on the scribe's personal observations and the provider's statements to them. This document has been checked and approved by the attending provider.

## 2015-11-10 ENCOUNTER — Ambulatory Visit: Payer: Medicare Other

## 2015-11-10 ENCOUNTER — Ambulatory Visit
Admission: RE | Admit: 2015-11-10 | Discharge: 2015-11-10 | Disposition: A | Discharge status: Home / Self Care | Payer: Medicare Other | Source: Ambulatory Visit

## 2015-11-10 DIAGNOSIS — M25512 Pain in left shoulder: Secondary | ICD-10-CM

## 2015-11-10 DIAGNOSIS — M25612 Stiffness of left shoulder, not elsewhere classified: Secondary | ICD-10-CM

## 2015-11-10 DIAGNOSIS — I890 Lymphedema, not elsewhere classified: Secondary | ICD-10-CM

## 2015-11-10 DIAGNOSIS — C779 Secondary and unspecified malignant neoplasm of lymph node, unspecified: Secondary | ICD-10-CM | POA: Diagnosis not present

## 2015-11-10 NOTE — Patient Instructions (Signed)
Use mirror at home for arm exercises, bringing arms out to sides as high as you can and KEEPING LEFT SHOULDER DOWN THROUGHOUT.  At least 5 times each time you go to the bathroom.  When using pulleys go slowly enough so you can pay attention to keeping Lt shoulder down.   Can use higher shelf or doorjamb to practice stretching arm higher and keeping shoulder down at same time.    Cancer Rehab [phone]

## 2015-11-10 NOTE — Therapy (Signed)
[place], [place], [address] Phone: [phone]   Fax:  [phone]  Physical Therapy Treatment  Patient Details  Name: Timothy Bradford MRN: [id number] Date of Birth: 08/28/1935 Referring Provider: Dr. Tammi Klippel  Encounter Date: 11/10/2015      PT End of Session - 11/10/15 1113    Visit Number 14   Number of Visits 17   Date for PT Re-Evaluation 11/14/15   PT Start Time 6967   PT Stop Time 1102   PT Time Calculation (min) 39 min   Activity Tolerance Patient tolerated treatment well   Behavior During Therapy [facility] for tasks assessed/performed      Past Medical History:  Diagnosis Date  . CHF 8938   Systolic failure. Cardiac cath in 2004: The LV showed mild global hypokinesia. EF of 45-50%. LVEDP was 12 mmHg. Left main was long which was patent. LAD has 10% distal stenosis. The vessel proximally was very tortuous. Diagonal one was small which was patent. Left circumflex was patent proximally and in mid portion and then tapers down in AV groove after giving off OM-2. OM-1 was large which was p  . Chronic venous insufficiency   . Erectile dysfunction   . Hyperlipidemia   . Hypertension   . Osteoarthritis   . Seborrheic keratoses    Multiple. Sees Lyndle Herrlich  . Seizures ([place]) 1999  . 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 AXILLARY LYMPH NODE DISSECTION Left 08/12/2015  . AXILLARY LYMPH NODE DISSECTION Left 08/12/2015   Procedure: LEFT AXILLARY LYMPH NODE DISSECTION;  Surgeon: Erroll Luna, MD;  Location: [facility];  Service: General;  Laterality: Left;  . CATARACT EXTRACTION W/ INTRAOCULAR LENS  IMPLANT, BILATERAL Bilateral 2016  . EYE SURGERY     bilateral cataracts  . HERNIA REPAIR    . SKIN CANCER EXCISION     left shoulder 3 yrs ago    There were no vitals filed for  this visit.      Subjective Assessment - 11/10/15 1027    Subjective I got my sleeve on Saturday and have been wearing them each day since and they fit great. I feel like I'm doing really good and am ready to D/C. I do all my exercises at home.    Pertinent History Lymph nodes, regional resection, left axillary, NODULAR MASS OF SQUAMOUS CELL CARCINOMA INVOLVING THE DEEP DERMIS AND SUBCUTANEOUS SOFT tissue, - 5 lymph nodes removed all benign, pt to begin radiation therapy next week   Patient Stated Goals to be able to lift the arm   Currently in Pain? No/denies            [facility] PT Assessment - 11/10/15 0001      AROM   Left Shoulder Flexion 134 Degrees   Left Shoulder ABduction 125 Degrees  135 degrees P/ROM at end of stretching              Quick Dash - 11/10/15 0001    Open a tight or new jar No difficulty   Do heavy household chores (wash walls, wash floors) Moderate difficulty   Carry a shopping bag or briefcase No difficulty   Wash your back No difficulty   Use a knife to cut food No difficulty   Recreational activities in which you take some force or impact through  your arm, shoulder, or hand (golf, hammering, tennis) No difficulty   During the past week, to what extent has your arm, shoulder or hand problem interfered with your normal social activities with family, friends, neighbors, or groups? Not at all   During the past week, to what extent has your arm, shoulder or hand problem limited your work or other regular daily activities Not at all   Arm, shoulder, or hand pain. None   Tingling (pins and needles) in your arm, shoulder, or hand None   Difficulty Sleeping No difficulty   DASH Score 4.55 %               [facility] Adult PT Treatment/Exercise - 11/10/15 0001      Shoulder Exercises: Pulleys   Flexion 2 minutes   ABduction 2 minutes   ABduction Limitations VC and demo thoughout to remind pt to decrease scapular compensations and decrease speed so he  could focus on compensations.      Shoulder Exercises: Therapy Ball   Flexion 10 reps   ABduction 10 reps   ABduction Limitations Tactile cuing throughout to decrease scapular compensations.     Shoulder Exercises: ROM/Strengthening   Other ROM/Strengthening Exercises Finger Ladder for Lt UE abduction with tactile cuing for decrease scapular compensations 11 times; then practiced bil UE abduction in front of mirror for correct scapular motion   Other ROM/Strengthening Exercises Backward shoulder rolls 15 times     Manual Therapy   Scapular Mobilization To Lt scapula into depression during P/ROM abduction.    Passive ROM In supine to left shoulder for ER, abduction, and flexion; left elbow into extension;                PT Education - 11/10/15 1120    Education provided Yes   Education Details To perorm pulleys slowly and with correct scapular motion and how to use mirror also for decrease scapular motion with A/ROM of Lt UE   Person(s) Educated Patient;Spouse   Methods Explanation;Demonstration;Handout;Tactile cues   Comprehension Verbalized understanding;Returned demonstration           Short Term Clinic Goals - 11/10/15 1109      CC Short Term Goal  #1   Title Pt will demonstrate increased left shoulder flexion of 130 degrees to improve ability to reach items on shelf.    Baseline 108, 10/03/15- 140 degrees,    Status Achieved     CC Short Term Goal  #2   Title Pt to demonstrate increased left shoulder abduction of 120 degrees to allow pt to reach items out to sides.   Status Achieved     CC Short Term Goal  #3   Title Pt and spouse will be independent in verbalizing lymphedema risk reduction practices   Status Achieved             Long Term Clinic Goals - 11/10/15 1110      CC Long Term Goal  #1   Title Pt will demonstrate 160 degrees of left shoulder flexion to allow pt to reach items overhead.    Baseline 108, 10/10/15- 144 degrees, 10/22/15 -149, 130  degrees 10/29/15 (pt struggled with compensations today), 11/05/15- 165 degrees, 134 degrees without compensations 11/10/15    Status Partially Met     CC Long Term Goal  #2   Title Pt will demonstrate 160 degrees of left shoulder abduction to allow pt to reach items out to sides.    Baseline 98, 10/10/15 - 108 -  pt reports it is more stiff today, 10/22/15- 120, 121 degrees 10/29/15, 11/05/15- 135 degrees; 124 degrees 11/10/15 (could go higher but with compensations)   Status Partially Met     CC Long Term Goal  #3   Title Pt to be independent in a home exercise program for strengthening and stretching  Reviewed proper technique of slow,controlled motions today without scapular compensations.   Status Achieved     CC Long Term Goal  #4   Title Pt will recieve an appropriate compression garment for long term management of edema   Baseline Pt to be measured in the next few days 10/10/15, 10/22/15- pt states he will be measured this week, pt reports this ordered and to arrive 11/06/15; arrived 11/08/15 and are a great fit   Status Achieved     CC Long Term Goal  #5   Title Pt and/or spouse will be independent in self drainage technique for long term management of edema.    Status Achieved            Plan - 11/10/15 1113    Clinical Impression Statement Pt came in wearing his new sleeve and gauntlet today which were a great fit for pt and he has been wearing them during the day only without any tingling or painful symptoms. Pt has done well with his therapt overall and is ready for D/C at this time being pleased with his current functional status and has made great gains since beginning therapy. He was instructed today to continue focusing on decresing scapular compensations with his A/and AA/ROM stretching at home by being sure to go slow on the pulleys and using the mirror for visual cuing of correct UE and scapular motion. He verbalized understanding all this and was able to return correct demonstration of  both.    Rehab Potential Good   Clinical Impairments Affecting Rehab Potential pt's wife states he has poor memory, have pt's wife in room to educate   PT Frequency 2x / week   PT Duration 8 weeks   PT Treatment/Interventions Passive range of motion;Taping;Manual techniques;Manual lymph drainage;Vasopneumatic Device;Therapeutic exercise;Therapeutic activities;Patient/family education;DME Instruction;Scar mobilization;Compression bandaging;ADLs/Self Care Home Management   PT Next Visit Plan D/C this visit.    Consulted and Agree with Plan of Care Patient      Patient will benefit from skilled therapeutic intervention in order to improve the following deficits and impairments:  Decreased range of motion, Decreased strength, Increased fascial restricitons, Impaired UE functional use, Pain, Decreased knowledge of precautions, Decreased knowledge of use of DME  Visit Diagnosis: Stiffness of left shoulder, not elsewhere classified  Pain in left shoulder  Lymphedema, not elsewhere classified  G-code completed and rating was CI (limited 1-20%) for discharge status.  His DASH score was 4.5 which was a significant improvement from his evaluation.   Problem List Patient Active Problem List   Diagnosis Date Noted  . Axillary mass 08/12/2015  . Metastatic squamous cell carcinoma ([place]) 06/20/2015  . Mass of left axilla 05/22/2015  . Encounter for immunization 12/27/2014  . Chronic venous insufficiency   . H/O hyperglycemia 04/10/2014  . Gout 07/25/2013  . Anemia 07/25/2013  . Swelling of joint, wrist, left 07/13/2013  . Seborrheic keratoses, inflamed 10/26/2012  . Health care maintenance 10/26/2012  . Chronic diastolic congestive heart failure ([place]) 12/26/2008  . OSTEOARTHRITIS 05/27/2006  . Dyslipidemia 04/26/2006  . ERECTILE DYSFUNCTION 02/09/2006  . Essential hypertension 02/09/2006    Otelia Limes, PTA 11/10/2015, 11:21 AM  [place], [place], [address] Phone: [phone]   Fax:  [phone]  Name: Timothy Bradford MRN: [id number] Date of Birth: 12/31/1935   PHYSICAL THERAPY DISCHARGE SUMMARY  Visits from Start of Care: 14  Current functional level related to goals / functional outcomes: All goals met except those related to shoulder ROM.  He has improved but continues to be stiff in his shoulder due to other comorbidities (arthritis?).   Remaining deficits: None except shoulder ROM deficits.   Education / Equipment: Compression garments  Plan: Patient agrees to discharge.  Patient goals were partially met. Patient is being discharged due to being pleased with the current functional level.  ?????  Annia Friendly, [place] 11/10/15 11:33 AM

## 2015-11-11 ENCOUNTER — Ambulatory Visit
Admission: RE | Admit: 2015-11-11 | Discharge: 2015-11-11 | Disposition: A | Discharge status: Home / Self Care | Payer: Medicare Other | Source: Ambulatory Visit

## 2015-11-11 DIAGNOSIS — C779 Secondary and unspecified malignant neoplasm of lymph node, unspecified: Secondary | ICD-10-CM | POA: Diagnosis not present

## 2015-11-12 ENCOUNTER — Ambulatory Visit
Admission: RE | Admit: 2015-11-12 | Discharge: 2015-11-12 | Disposition: A | Discharge status: Home / Self Care | Payer: Medicare Other | Source: Ambulatory Visit

## 2015-11-12 ENCOUNTER — Encounter: Payer: Self-pay | Admitting: Radiation Oncology

## 2015-11-12 ENCOUNTER — Ambulatory Visit: Payer: Medicare Other

## 2015-11-12 DIAGNOSIS — C779 Secondary and unspecified malignant neoplasm of lymph node, unspecified: Secondary | ICD-10-CM | POA: Diagnosis not present

## 2015-11-15 NOTE — Progress Notes (Signed)
  Radiation Oncology         [phone]) [phone] ________________________________  Name: Timothy Bradford MRN: [id number]  Date: 11/12/2015  DOB: 1935/10/22  End of Treatment Note  Diagnosis:   80 yo man with squamous cell carcinoma metastatic to the left axillary nodal basin     Indication for treatment:  Curative Local Control       Radiation treatment dates:   10/01/15-11/12/15  Site/dose:   Lt Axilla treated to 60 Gy in 30 fx  Beams/energy:   6 and 15 MV X-rays to AP/PA arrangement  Narrative: The patient tolerated radiation treatment relatively well.   Minimal dermatitis  Plan: The patient has completed radiation treatment. The patient will return to radiation oncology clinic for routine followup in one month. I advised him to call or return sooner if he has any questions or concerns related to his recovery or treatment. ________________________________  Sheral Apley. Tammi Klippel, M.D.

## 2015-11-17 ENCOUNTER — Ambulatory Visit: Payer: Medicare Other

## 2015-11-21 ENCOUNTER — Telehealth: Payer: Self-pay | Admitting: Radiation Oncology

## 2015-11-21 NOTE — Telephone Encounter (Signed)
Patient's wife left message requesting return call. Phoned her back. She reports her husband has a very small area of open skin within the radiation treatment field. She denies this area feels fevered or swollen. She denies this area has any drainage. Explained these changes sound like skin changes associated with radiation treatment. Encouraged her to have him keep the area clean, dry, and uncovered. Encouraged her to have him apply neosporin to area of open skin. She verbalized understanding. She understands to contact this RN with any future needs. She expressed appreciation for the return call.

## 2015-12-16 ENCOUNTER — Ambulatory Visit: Admission: RE | Admit: 2015-12-16 | Payer: Medicare Other | Source: Ambulatory Visit | Admitting: Radiation Oncology

## 2015-12-26 ENCOUNTER — Ambulatory Visit: Payer: Medicare Other | Admitting: Radiation Oncology

## 2016-01-20 ENCOUNTER — Encounter: Payer: Self-pay | Admitting: Radiation Oncology

## 2016-01-20 ENCOUNTER — Telehealth: Payer: Self-pay | Admitting: *Deleted

## 2016-01-20 ENCOUNTER — Telehealth: Payer: Self-pay | Admitting: Hematology and Oncology

## 2016-01-20 ENCOUNTER — Ambulatory Visit
Admission: RE | Admit: 2016-01-20 | Discharge: 2016-01-20 | Disposition: A | Discharge status: Home / Self Care | Payer: Medicare Other | Source: Ambulatory Visit | Attending: Radiation Oncology | Admitting: Radiation Oncology

## 2016-01-20 VITALS — BP 154/106 | HR 80 | Temp 97.8°F | Resp 16 | Ht 74.0 in | Wt 193.8 lb

## 2016-01-20 DIAGNOSIS — C773 Secondary and unspecified malignant neoplasm of axilla and upper limb lymph nodes: Secondary | ICD-10-CM | POA: Insufficient documentation

## 2016-01-20 DIAGNOSIS — IMO0002 Reserved for concepts with insufficient information to code with codable children: Secondary | ICD-10-CM

## 2016-01-20 DIAGNOSIS — C799 Secondary malignant neoplasm of unspecified site: Secondary | ICD-10-CM

## 2016-01-20 DIAGNOSIS — Z888 Allergy status to other drugs, medicaments and biological substances status: Secondary | ICD-10-CM | POA: Insufficient documentation

## 2016-01-20 DIAGNOSIS — Z7982 Long term (current) use of aspirin: Secondary | ICD-10-CM | POA: Insufficient documentation

## 2016-01-20 DIAGNOSIS — I890 Lymphedema, not elsewhere classified: Secondary | ICD-10-CM | POA: Insufficient documentation

## 2016-01-20 DIAGNOSIS — R2232 Localized swelling, mass and lump, left upper limb: Secondary | ICD-10-CM

## 2016-01-20 DIAGNOSIS — I10 Essential (primary) hypertension: Secondary | ICD-10-CM | POA: Insufficient documentation

## 2016-01-20 DIAGNOSIS — Z923 Personal history of irradiation: Secondary | ICD-10-CM | POA: Diagnosis not present

## 2016-01-20 MED ORDER — RADIAPLEXRX EX GEL
Freq: Once | CUTANEOUS | Status: AC
  Administered 2016-01-20: 10:00:00 via TOPICAL

## 2016-01-20 NOTE — Addendum Note (Signed)
Encounter addended by: Benn Moulder, RN on: 01/20/2016  1:13 PM<BR>    Actions taken: Charge Capture section accepted

## 2016-01-20 NOTE — Progress Notes (Addendum)
Timothy Bradford here for reassessment S/P XRT to his left Axillary region.  He denies any pain tin the tx field.  Note hyperpigmentation in the axillary region wild intermittent itching.  Given Radiaplex Gel as per his request.  He also denies any fatigue and sleeping without difficulty. Wearing lymphedema sleeve - not mild swelling-left hand.  BP (!) 173/92 (BP Location: Right Arm, Patient Position: Sitting, Cuff Size: Normal)   Pulse 63   Temp 97.8 F (36.6 C) (Oral)   Resp 16   Ht 6\' 2"  (1.88 m)   Wt 193 lb 12.8 oz (87.9 kg)   BMI 24.88 kg/m    BP (!) 154/106 (BP Location: Right Arm, Patient Position: Sitting, Cuff Size: Large)   Pulse 80   Temp 97.8 F (36.6 C) (Oral)   Resp 16   Ht 6\' 2"  (1.88 m)   Wt 193 lb 12.8 oz (87.9 kg)   BMI 24.88 kg/m    Wt Readings from Last 3 Encounters:  01/20/16 193 lb 12.8 oz (87.9 kg)  11/07/15 194 lb 1.6 oz (88 kg)  10/31/15 188 lb 6.4 oz (85.5 kg)

## 2016-01-20 NOTE — Progress Notes (Signed)
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 gentleman who was found to have a mass on his posterior shoulder/upper back which was found to be consistent with squamous cell carcinoma, and had surgical resection. He did have metastatic disease in the left axilla and after undergoing ENT work up and breast work up, he underwent left axillary dissection. He subsequently completed radiotherapy to this site. During treatment he did very well and comes for a post treatment follow up.                          On review of systems, the patient states he's doing well. He's not sure who he is supposed to be followed by, and reports that since treatment he's doing very well. He uses radiaplex for his skin and reports intermittent itching. He reports he took his BP medication this morning, but also ate pork. He denies any chest pain, shortness of breath, fevers, or chills. No other complaints are noted.  ALLERGIES:  is allergic to ace inhibitors and fosinopril sodium.  Meds: Current Outpatient Prescriptions  Medication Sig Dispense Refill  . acetaminophen (TYLENOL) 500 MG tablet Take 1,000 mg by mouth every 8 (eight) hours as needed. Reported on 10/02/2015    . aspirin EC 81 MG tablet Take by mouth.    . metoprolol tartrate (LOPRESSOR) 25 MG tablet Take 1 tablet (25 mg total) by mouth 2 (two) times daily. 180 tablet 3  . non-metallic deodorant (ALRA) MISC Apply 1 application topically daily as needed. Reported on 10/17/2015    . simvastatin (ZOCOR) 40 MG tablet TAKE 1 TABLET BY MOUTH AT BEDTIME 90  tablet 3  . Wound Cleansers (RADIAPLEX EX) Apply topically.     No current facility-administered medications for this encounter.     Physical Findings: Wt Readings from Last 3 Encounters:  01/20/16 193 lb 12.8 oz (87.9 kg)  11/07/15 194 lb 1.6 oz (88 kg)  10/31/15 188 lb 6.4 oz (85.5 kg)   Temp Readings from Last 3 Encounters:  01/20/16 97.8 F (36.6 C) (Oral)  10/24/15 98 F (36.7 C)  10/17/15 98.8 F (37.1 C)   BP Readings from Last 3 Encounters:  01/20/16 (!) 154/106  11/07/15 (!) 158/78  10/31/15 (!) 157/77   Pulse Readings from Last 3 Encounters:  01/20/16 80  11/07/15 67  10/31/15 76   In general this is a well appearing African American male in no acute distress. He's alert and oriented x4 and appropriate throughout the examination. Cardiopulmonary assessment is negative for acute distress and he exhibits normal effort. The left upper extremity is intact with a lymphedema sleeve in place. No edema is noted. The axilla has a well healed scar without edema. No palpable induration or adenopathy was present. Hyperpigmentation is noted without desquamation.  Lab Findings: Lab Results  Component Value Date   WBC 8.9 08/13/2015   HGB 11.0 (L) 08/13/2015   HCT 35.0 (L) 08/13/2015   MCV 92.1 08/13/2015  PLT 338 08/13/2015     Radiographic Findings: No results found.  Impression/Plan: 35. 80 yo man with squamous cell carcinoma metastatic to the left axillary nodal basin. The patient appears to be doing well overall. We discussed the need for follow up in medical oncology and will call for an appointment to follow up with Dr. Lindi Adie. He states agreement and understanding. He also is in need of close follow up with dermatology and for skin checks. He has previously seen a dermatologist on [employer] here in [place], and we will coordinate a follow up appointment in that office. We also reviewed vitamin E containing skin products for the next 6 months, and would be  happy to see him back as needed. 2. Lymphedema. The patient has this under good control with his lymphedema compression sleeve and wears this during the day and takes a break from it during the night. He will continue his massage and exercises and follow up with PT as needed. 3. Hypertension. The patient is asymptomatic but will continue to follow up with his PCP.      Carola Rhine, PAC

## 2016-01-20 NOTE — Telephone Encounter (Signed)
Rad Onc requested an appointment for pt to f/u with Dr. Lindi Adie

## 2016-01-20 NOTE — Telephone Encounter (Signed)
Called patient to inform of fu with Dr. Lindi Adie on 01-26-16 - arrival time - 11:15 am, lvm for a return call

## 2016-01-26 ENCOUNTER — Ambulatory Visit (HOSPITAL_BASED_OUTPATIENT_CLINIC_OR_DEPARTMENT_OTHER): Payer: Medicare Other | Admitting: Hematology and Oncology

## 2016-01-26 ENCOUNTER — Telehealth: Payer: Self-pay | Admitting: Hematology and Oncology

## 2016-01-26 ENCOUNTER — Encounter: Payer: Self-pay | Admitting: Hematology and Oncology

## 2016-01-26 DIAGNOSIS — IMO0002 Reserved for concepts with insufficient information to code with codable children: Secondary | ICD-10-CM

## 2016-01-26 DIAGNOSIS — C801 Malignant (primary) neoplasm, unspecified: Secondary | ICD-10-CM | POA: Diagnosis not present

## 2016-01-26 DIAGNOSIS — C773 Secondary and unspecified malignant neoplasm of axilla and upper limb lymph nodes: Secondary | ICD-10-CM

## 2016-01-26 DIAGNOSIS — C799 Secondary malignant neoplasm of unspecified site: Secondary | ICD-10-CM

## 2016-01-26 NOTE — Assessment & Plan Note (Signed)
Mammogram 06/11/15: No suspicious masses in the breast; in the left axilla large fixed mass was palpable measuring 4.1 x 5.3 x 4.2 cm Axillary lymph node biopsy 06/19/2015: Metastatic squamous cell carcinoma in left axillary mass  PET-CT 07/08/15: Large hypermetabolic mass in the left axilla measuring 7.4 cm with SUV 32, several smaller lymph nodes surrounding this. Emphysema, liver cysts, indeterminate structure in the left lobe of the liver without significant uptake. Liver MRI: 07/21/2015 5.2 cm benign cavernous hemangioma Left axillary LND 08/12/2015: Nodular mass of squamous cell carcinoma involving deep dermis and subcutaneous soft tissue 8.6 cm, 0/5 benign LN, no lymph nodal tissue in the mass seen skin is negative,DD: Soft tissue tumor deposit vs lymph node replaced with SCC Adjuvant radiation therapy 10/01/2015 to 11/12/2015  Plan: Surveillance. We may obtain a CT of neck, chest abdomen pelvis in 6 months and follow-up.

## 2016-01-26 NOTE — Progress Notes (Signed)
Patient Care Team: Riccardo Dubin, MD as PCP - General (Internal Medicine)  DIAGNOSIS:  Encounter Diagnosis  Name Primary?  . Metastatic squamous cell carcinoma ([place])     SUMMARY OF ONCOLOGIC HISTORY:   Metastatic squamous cell carcinoma ([place])   06/11/2015 Imaging    Mammogram: No suspicious masses in the breast; in the left axilla large fixed mass was palpable measuring 4.1 x 5.3 x 4.2 cm      06/19/2015 Initial Diagnosis    Metastatic squamous cell carcinoma in left axillary mass      08/12/2015 Surgery    Left axillary LND: Nodular mass of squamous cell carcinoma involving deep dermis and subcutaneous soft tissue 8.6 cm, 0/5 benign LN, no lymph nodal tissue in the mass seen skin is negative,DD: Soft tissue tumor deposit vs lymph node replaced with [facility]      10/01/2015 - 11/12/2015 Radiation Therapy    Radiation therapy to the axilla       CHIEF COMPLIANT: Follow-up after completion of radiation therapy  INTERVAL HISTORY: Timothy Bradford is a 80 year old gentleman with above-mentioned history left axillary metastatic squamous cell carcinoma. He underwent left axillary lymph node dissection followed by adjuvant radiation therapy. There is no clear-cut primary focus of cancer. He had PET/CT scan as well as ENT evaluation and there was no evidence of disease. He underwent radiation and reports that he is healing quite well from the effects of it.  REVIEW OF SYSTEMS:   Constitutional: Denies fevers, chills or abnormal weight loss Eyes: Denies blurriness of vision Ears, nose, mouth, throat, and face: Denies mucositis or sore throat Respiratory: Denies cough, dyspnea or wheezes Cardiovascular: Denies palpitation, chest discomfort Gastrointestinal:  Denies nausea, heartburn or change in bowel habits Skin: Denies abnormal skin rashes Lymphatics: Denies new lymphadenopathy or easy bruising Neurological:Denies numbness, tingling or new weaknesses Behavioral/Psych: Mood is stable, no new  changes  Extremities: No lower extremity edema Breast:  denies any pain or lumps or nodules in either breasts All other systems were reviewed with the patient and are negative.  I have reviewed the past medical history, past surgical history, social history and family history with the patient and they are unchanged from previous note.  ALLERGIES:  is allergic to ace inhibitors and fosinopril sodium.  MEDICATIONS:  Current Outpatient Prescriptions  Medication Sig Dispense Refill  . acetaminophen (TYLENOL) 500 MG tablet Take 1,000 mg by mouth every 8 (eight) hours as needed. Reported on 10/02/2015    . aspirin EC 81 MG tablet Take by mouth.    . metoprolol tartrate (LOPRESSOR) 25 MG tablet Take 1 tablet (25 mg total) by mouth 2 (two) times daily. 180 tablet 3  . non-metallic deodorant (ALRA) MISC Apply 1 application topically daily as needed. Reported on 10/17/2015    . simvastatin (ZOCOR) 40 MG tablet TAKE 1 TABLET BY MOUTH AT BEDTIME 90 tablet 3  . Wound Cleansers (RADIAPLEX EX) Apply topically.     No current facility-administered medications for this visit.     PHYSICAL EXAMINATION: ECOG PERFORMANCE STATUS: 1 - Symptomatic but completely ambulatory  Vitals:   01/26/16 1200  BP: (!) 188/76  Pulse: 65  Resp: 18  Temp: 98 F (36.7 C)   Filed Weights   01/26/16 1200  Weight: 192 lb 8 oz (87.3 kg)    GENERAL:alert, no distress and comfortable SKIN: skin color, texture, turgor are normal, no rashes or significant lesions EYES: normal, Conjunctiva are pink and non-injected, sclera clear OROPHARYNX:no exudate, no erythema and  lips, buccal mucosa, and tongue normal  NECK: supple, thyroid normal size, non-tender, without nodularity LYMPH:  no palpable lymphadenopathy in the cervical, axillary or inguinal LUNGS: clear to auscultation and percussion with normal breathing effort HEART: regular rate & rhythm and no murmurs and no lower extremity edema ABDOMEN:abdomen soft, non-tender  and normal bowel sounds MUSCULOSKELETAL:no cyanosis of digits and no clubbing  NEURO: alert & oriented x 3 with fluent speech, no focal motor/sensory deficits EXTREMITIES: No lower extremity edema  LABORATORY DATA:  I have reviewed the data as listed   Chemistry      Component Value Date/Time   NA 142 08/13/2015 0534   NA 142 05/22/2015 1129   K 4.3 08/13/2015 0534   CL 109 08/13/2015 0534   CO2 23 08/13/2015 0534   BUN 8 08/13/2015 0534   BUN 9 05/22/2015 1129   CREATININE 0.76 08/13/2015 0534   CREATININE 1.00 09/19/2014 0954      Component Value Date/Time   CALCIUM 8.6 (L) 08/13/2015 0534   ALKPHOS 45 08/13/2015 0534   AST 13 (L) 08/13/2015 0534   ALT 11 (L) 08/13/2015 0534   BILITOT 0.4 08/13/2015 0534   BILITOT 0.4 05/22/2015 1129       Lab Results  Component Value Date   WBC 8.9 08/13/2015   HGB 11.0 (L) 08/13/2015   HCT 35.0 (L) 08/13/2015   MCV 92.1 08/13/2015   PLT 338 08/13/2015   NEUTROABS 3.4 05/22/2015     ASSESSMENT & PLAN:  Metastatic squamous cell carcinoma (HCC) Mammogram 06/11/15: No suspicious masses in the breast; in the left axilla large fixed mass was palpable measuring 4.1 x 5.3 x 4.2 cm Axillary lymph node biopsy 06/19/2015: Metastatic squamous cell carcinoma in left axillary mass  PET-CT 07/08/15: Large hypermetabolic mass in the left axilla measuring 7.4 cm with SUV 32, several smaller lymph nodes surrounding this. Emphysema, liver cysts, indeterminate structure in the left lobe of the liver without significant uptake. Liver MRI: 07/21/2015 5.2 cm benign cavernous hemangioma Left axillary LND 08/12/2015: Nodular mass of squamous cell carcinoma involving deep dermis and subcutaneous soft tissue 8.6 cm, 0/5 benign LN, no lymph nodal tissue in the mass seen skin is negative,DD: Soft tissue tumor deposit vs lymph node replaced with SCC Adjuvant radiation therapy 10/01/2015 to 11/12/2015  Plan: Surveillance. We will obtain a CT of neck, chest  abdomen pelvis in 6 months and follow-up.     Orders Placed This Encounter  Procedures  . CT Soft Tissue Neck W Contrast    Standing Status:   Future    Standing Expiration Date:   01/25/2017    Order Specific Question:   If indicated for the ordered procedure, I authorize the administration of contrast media per Radiology protocol    Answer:   Yes    Order Specific Question:   Reason for Exam (SYMPTOM  OR DIAGNOSIS REQUIRED)    Answer:   Metastatic Squamous cell cancer restaging    Order Specific Question:   Preferred imaging location?    Answer:   [facility]  . CT Chest W Contrast    Standing Status:   Future    Standing Expiration Date:   01/25/2017    Order Specific Question:   If indicated for the ordered procedure, I authorize the administration of contrast media per Radiology protocol    Answer:   Yes    Order Specific Question:   Reason for Exam (SYMPTOM  OR DIAGNOSIS REQUIRED)    Answer:  Metastatic Squamous cell cancer restaging    Order Specific Question:   Preferred imaging location?    Answer:   [facility]  . CT Abdomen Pelvis W Contrast    Standing Status:   Future    Standing Expiration Date:   01/25/2017    Order Specific Question:   If indicated for the ordered procedure, I authorize the administration of contrast media per Radiology protocol    Answer:   Yes    Order Specific Question:   Reason for Exam (SYMPTOM  OR DIAGNOSIS REQUIRED)    Answer:   Metastatic Squamous cell cancer restaging    Order Specific Question:   Preferred imaging location?    Answer:   [facility]   The patient has a good understanding of the overall plan. he agrees with it. he will call with any problems that may develop before the next visit here.   Rulon Eisenmenger, MD 01/26/16

## 2016-01-26 NOTE — Telephone Encounter (Signed)
AVS report and appointment schedule, given to patient, per 01/26/16 los.

## 2016-01-30 ENCOUNTER — Encounter: Payer: Medicare Other | Admitting: Internal Medicine

## 2016-02-23 ENCOUNTER — Ambulatory Visit: Payer: Medicare Other | Admitting: Dietician

## 2016-02-23 ENCOUNTER — Ambulatory Visit (INDEPENDENT_AMBULATORY_CARE_PROVIDER_SITE_OTHER): Payer: Medicare Other | Admitting: Internal Medicine

## 2016-02-23 ENCOUNTER — Encounter: Payer: Self-pay | Admitting: Internal Medicine

## 2016-02-23 ENCOUNTER — Telehealth: Payer: Self-pay | Admitting: Internal Medicine

## 2016-02-23 VITALS — BP 173/90 | HR 62 | Temp 97.7°F | Ht 74.0 in | Wt 192.0 lb

## 2016-02-23 DIAGNOSIS — Z23 Encounter for immunization: Secondary | ICD-10-CM

## 2016-02-23 DIAGNOSIS — Z79899 Other long term (current) drug therapy: Secondary | ICD-10-CM | POA: Diagnosis not present

## 2016-02-23 DIAGNOSIS — I10 Essential (primary) hypertension: Secondary | ICD-10-CM | POA: Diagnosis not present

## 2016-02-23 DIAGNOSIS — Z7189 Other specified counseling: Secondary | ICD-10-CM | POA: Diagnosis not present

## 2016-02-23 DIAGNOSIS — Z0000 Encounter for general adult medical examination without abnormal findings: Secondary | ICD-10-CM | POA: Diagnosis not present

## 2016-02-23 MED ORDER — AMLODIPINE BESYLATE 5 MG PO TABS: 5.0000 mg | ORAL_TABLET | Freq: Every day | ORAL | 3 refills | Status: DC

## 2016-02-23 NOTE — Progress Notes (Signed)
Subjective:   Timothy Bradford is a 80 y.o. male who presents for a Medicare Annual Wellness Visit.  The following items have been reviewed and updated today in the appropriate area in the EMR.   Health Risk Assessment  Height, weight, BMI, and BP Visual acuity if needed Depression screen Fall risk / safety level Advance directive discussion Medical and family history were reviewed and updated Updating list of other providers & suppliers Medication reconciliation, including over the counter medicines Cognitive screen Written screening schedule Risk Factor list Personalized health advice, risky behaviors, and treatment advice       Objective:    Vitals: BP (!) 179/103 (BP Location: Left Arm, Patient Position: Sitting, Cuff Size: Normal)   Pulse 76   Temp 97.7 F (36.5 C) (Oral)   Ht 6\' 2"  (1.88 m)   Wt 192 lb (87.1 kg)   SpO2 100%   BMI 24.65 kg/m   Activities of Daily Living In your present state of health, do you have any difficulty performing the following activities: 02/23/2016 08/12/2015  Hearing? N -  Vision? Y -  Difficulty concentrating or making decisions? Y -  Walking or climbing stairs? N -  Dressing or bathing? N -  Doing errands, shopping? N Y  Some recent data might be hidden    Goals Goals    . Blood Pressure < 140/90       Fall Risk Fall Risk  02/23/2016 01/20/2016 09/17/2015 07/10/2015 05/22/2015  Falls in the past year? No No No No No  Number falls in past yr: - - - - -  Risk for fall due to : - - - - -   Timed up and go test 13 seconds [1 second over the 12 second cut off for increased risk for falling]  Depression Screen PHQ 2/9 Scores 02/23/2016 01/20/2016 09/17/2015 07/10/2015  PHQ - 2 Score 0 0 0 0     Cognitive Testing I assessed the patient for cognitive issues and the patient did  have issues with his / her cognition.  Mini cog 4/5.   MOCA Score 18/30 though he declined to do serial sevens as he indicated prior difficulties with  mathematics. Other areas which she struggled with included visual spatial/executive [though he also acknowledged vision impairment] language, delayed recall.   Assessment and Plan:    During the course of the visit the patient was educated and counseled about appropriate screening and preventive services as documented in the assessment and plan.  The printed AVS was given to the patient and included an updated screening schedule, a list of risk factors, and personalized health advice.    Advance care planning I spent 5 minutes in advance care planning today with the patient today as outlined below.  Who would be the best person to help the doctors make decisions? [Numbers updated in the EMR.] Ms. Tysheen Laible [Spouse]  What matters most to you? Defer to following visit to discuss  What experiences have you had with serious illness or death? Defer to following visit to discuss  If you were very sick, what would be most important to you? Will discuss at future visit".  How much flexibility should you afford your decision-maker? Will discuss at future visit".  Do you have anything at home to show others what we talked about to help them honor your wishes? "No but interested in receiving information   Essential hypertension Assessment His blood pressure today is elevated at 179/103. He is only on metoprolol  25 mg twice daily. Given his age, he is not at goal less than 150/90.  Plan -Prescribe amlodipine 5 mg with plan to recheck at follow-up.   Need for immunization against influenza Assessment He is agreeable to receiving the flu vaccine today.  Plan -Administer flu vaccine      Charlott Rakes, MD  02/23/2016

## 2016-02-23 NOTE — Progress Notes (Signed)
Assisted with Dr. Posey Pronto with Medicare wellness visit by working with patient and significant other to completye the TUG test, Mini- Cog , updated careteam and reveiwing health risk assessment (already done prior to visit) .  Gave information on living will to Mr. Kuligowski and significant other at their request. Offered to have social work help them complete it during their visit.

## 2016-02-23 NOTE — Telephone Encounter (Signed)
Calling states there is an interaction with a new medication we sent in for the patient.

## 2016-02-23 NOTE — Assessment & Plan Note (Addendum)
I spent 5 minutes in advance care planning today with the patient today as outlined below.  Who would be the best person to help the doctors make decisions? [Numbers updated in the EMR.] Ms. Timothy Bradford [Spouse]  What matters most to you? Defer to following visit to discuss  What experiences have you had with serious illness or death? Defer to following visit to discuss  If you were very sick, what would be most important to you? Will discuss at future visit".  How much flexibility should you afford your decision-maker? Will discuss at future visit".  Do you have anything at home to show others what we talked about to help them honor your wishes? "No but interested in receiving information

## 2016-02-25 DIAGNOSIS — Z23 Encounter for immunization: Secondary | ICD-10-CM | POA: Insufficient documentation

## 2016-02-25 NOTE — Assessment & Plan Note (Signed)
Assessment He is agreeable to receiving the flu vaccine today.  Plan -Administer flu vaccine

## 2016-02-25 NOTE — Progress Notes (Signed)
Case discussed with Dr. Patel at the time of the visit.  We reviewed the resident's history and exam and pertinent patient test results.  I agree with the assessment, diagnosis, and plan of care documented in the resident's note. 

## 2016-02-25 NOTE — Assessment & Plan Note (Addendum)
Assessment His blood pressure today is elevated at 179/103. He is only on metoprolol 25 mg twice daily. Given his age, he is not at goal less than 150/90.  Plan -Prescribe amlodipine 5 mg with plan to recheck at follow-up.

## 2016-03-01 NOTE — Telephone Encounter (Signed)
Pharmacist states that simvastatin and amlodipine can increase chances of myopathy- would you change to atorvastatin

## 2016-03-02 NOTE — Telephone Encounter (Signed)
I think it should be okay. I do not want to change his statin medication, but if he has symptoms, we can always do so.

## 2016-03-02 NOTE — Telephone Encounter (Signed)
Called and spoke to pharmacist, gave him dr patel's answer

## 2016-03-26 ENCOUNTER — Encounter: Payer: Medicare Other | Admitting: Internal Medicine

## 2016-05-02 ENCOUNTER — Other Ambulatory Visit: Payer: Self-pay | Admitting: Internal Medicine

## 2016-05-03 NOTE — Telephone Encounter (Signed)
As this patient was seen by me and deemed necessary for their treatment, I will refill this prescription for metoprolol.

## 2016-05-10 ENCOUNTER — Encounter: Payer: Self-pay | Admitting: Gastroenterology

## 2016-05-12 ENCOUNTER — Ambulatory Visit: Payer: Self-pay | Admitting: Radiation Oncology

## 2016-05-27 ENCOUNTER — Telehealth: Payer: Self-pay | Admitting: Internal Medicine

## 2016-05-27 NOTE — Telephone Encounter (Signed)
APT. REMINDER CALL, LMTCB °

## 2016-05-28 ENCOUNTER — Ambulatory Visit (INDEPENDENT_AMBULATORY_CARE_PROVIDER_SITE_OTHER): Payer: PPO | Admitting: Internal Medicine

## 2016-05-28 ENCOUNTER — Encounter (INDEPENDENT_AMBULATORY_CARE_PROVIDER_SITE_OTHER): Payer: Self-pay

## 2016-05-28 ENCOUNTER — Encounter: Payer: Self-pay | Admitting: Internal Medicine

## 2016-05-28 VITALS — BP 152/86 | HR 62 | Temp 97.5°F | Ht 74.0 in | Wt 189.5 lb

## 2016-05-28 DIAGNOSIS — G8929 Other chronic pain: Secondary | ICD-10-CM

## 2016-05-28 DIAGNOSIS — Z87891 Personal history of nicotine dependence: Secondary | ICD-10-CM | POA: Diagnosis not present

## 2016-05-28 DIAGNOSIS — Z7189 Other specified counseling: Secondary | ICD-10-CM | POA: Diagnosis not present

## 2016-05-28 DIAGNOSIS — I5032 Chronic diastolic (congestive) heart failure: Secondary | ICD-10-CM | POA: Diagnosis not present

## 2016-05-28 DIAGNOSIS — Z79899 Other long term (current) drug therapy: Secondary | ICD-10-CM | POA: Diagnosis not present

## 2016-05-28 DIAGNOSIS — I10 Essential (primary) hypertension: Secondary | ICD-10-CM

## 2016-05-28 DIAGNOSIS — I110 Hypertensive heart disease with heart failure: Secondary | ICD-10-CM | POA: Diagnosis not present

## 2016-05-28 DIAGNOSIS — M25512 Pain in left shoulder: Secondary | ICD-10-CM | POA: Diagnosis not present

## 2016-05-28 MED ORDER — AMLODIPINE BESYLATE 2.5 MG PO TABS: 5.0000 mg | ORAL_TABLET | Freq: Every day | ORAL | 3 refills | Status: DC

## 2016-05-28 MED ORDER — DICLOFENAC SODIUM 1 % TD GEL: 2.0000 g | Freq: Four times a day (QID) | TRANSDERMAL | 2 refills | Status: DC

## 2016-05-28 NOTE — Progress Notes (Signed)
   CC: hypertension  HPI:  [name] is a 81 y.o. man who presents today with his significant other for hypertension. Please see assessment & plan for status of chronic medical problems.   Past Medical History:  Diagnosis Date  . CHF [id number]   Systolic failure. Cardiac cath in 2004: The LV showed mild global hypokinesia. EF of 45-50%. LVEDP was 12 mmHg. Left main was long which was patent. LAD has 10% distal stenosis. The vessel proximally was very tortuous. Diagonal one was small which was patent. Left circumflex was patent proximally and in mid portion and then tapers down in AV groove after giving off OM-2. OM-1 was large which was p  . Chronic venous insufficiency   . Erectile dysfunction   . Hyperlipidemia   . Hypertension   . Osteoarthritis   . Seborrheic keratoses    Multiple. Sees Lyndle Herrlich  . Seizures ([place]) 1999  . Squamous cell cancer of skin of left shoulder Sept 2014   Removal by [facility] Harvel Quale with clear margins    Review of Systems:  Please see each problem below for a pertinent review of systems.  Physical Exam:  Vitals:   05/28/16 1354 05/28/16 1422  BP: (!) 149/82 (!) 152/86  Pulse: 69 62  Temp: 97.5 F (36.4 C)   TempSrc: Oral   SpO2: 98%   Weight: 189 lb 8 oz (86 kg)   Height: 6\' 2"  (1.88 m)    Physical Exam  Constitutional: He is oriented to person, place, and time. No distress.  HENT:  Head: Normocephalic and atraumatic.  Eyes: Conjunctivae are normal. No scleral icterus.  Cardiovascular: Normal rate and regular rhythm.   Pulmonary/Chest: Effort normal. No respiratory distress.  Musculoskeletal: Normal range of motion. He exhibits no tenderness (Left shoulder).  No left axillary masses palpated.  Neurological: He is alert and oriented to person, place, and time.  Skin: Rash (Multiple hyperpigmented lesions spanning his chest area.) noted. He is not diaphoretic.    Assessment & Plan:   See Encounters Tab for problem based  charting.  Patient discussed with Dr. Lynnae January

## 2016-05-28 NOTE — Patient Instructions (Signed)
For your blood pressure, we will half your current dose. Take 1/2 of your current medication. Once you run out, just take 1 tablet of the new one.   For the shoulder, try the voltaren gel to see how it helps.  We will call you about seeing me back in May.

## 2016-05-28 NOTE — Assessment & Plan Note (Signed)
He and his significant other were not planning to discuss advance directives today, so I provided them with a worksheet from [employer] to review prior to their next visit. They wanted their oldest daughter to participate as well.

## 2016-05-28 NOTE — Assessment & Plan Note (Signed)
Assessment His blood pressure was initially 149/82, down from 173/90 at last visit, and 152/86 on recheck. He is taking metoprolol 25 mg twice daily and amlodipine 5 mg. He feels dizzy intermittently, but his significant other feels it has happened more often since he started amlodipine back in November which is a known side effect. Otherwise, he denies symptoms of hypertensive urgency, like chest pain, headache, blurry vision.  Goal blood pressure <150/90 is appropriate for him given his age though he is noted to have chronic diastolic CHF for which [id number] would be ideal. He has had allergy to olmesartan with angioedema, and I'm not sure if diuretic would be ideal if he has symptoms of orthostasis.  Plan -Decrease amlodipine from 5 to 2.5 mg daily and reassess at follow-up.

## 2016-05-28 NOTE — Assessment & Plan Note (Signed)
Assessment He has chronic left shoulder pain which has been ongoing for awhile. His significant other feels it affects his sleep at night and therefore her sleep. He cannot recall anything that makes his better or worse though feels relief with a heating pad. He denies any heavy lifting recently. My exam is without bony tenderness or limited range of motion of the joint. I do not palpate any masses which would be concerning for recurrent disease as he underwent radiation therapy for squamous cell carcinoma.  Plan -Prescribed diclofenac gel to be applied to the shoulder as needed

## 2016-05-31 NOTE — Progress Notes (Signed)
Internal Medicine Clinic Attending  Case discussed with Dr. Patel,Rushil at the time of the visit.  We reviewed the resident's history and exam and pertinent patient test results.  I agree with the assessment, diagnosis, and plan of care documented in the resident's note.  

## 2016-06-07 ENCOUNTER — Telehealth: Payer: Self-pay | Admitting: *Deleted

## 2016-06-07 NOTE — Telephone Encounter (Signed)
Patient called requesting an appointment to be seen with MD Lindi Adie. Patient is complaining of increased pain ever since January to his left shoulder. Patient is due for CT in one month.

## 2016-06-07 NOTE — Telephone Encounter (Signed)
Please set him up to see me in the next week

## 2016-06-08 ENCOUNTER — Telehealth: Payer: Self-pay | Admitting: Hematology and Oncology

## 2016-06-08 NOTE — Telephone Encounter (Signed)
Scheduling message sent. 

## 2016-06-08 NOTE — Telephone Encounter (Signed)
Confirmed 3/9 appt with pt at 10 am per LOS

## 2016-06-11 ENCOUNTER — Ambulatory Visit: Payer: No Typology Code available for payment source | Admitting: Hematology and Oncology

## 2016-06-11 ENCOUNTER — Other Ambulatory Visit (HOSPITAL_BASED_OUTPATIENT_CLINIC_OR_DEPARTMENT_OTHER): Payer: PPO

## 2016-06-11 ENCOUNTER — Other Ambulatory Visit: Payer: Self-pay

## 2016-06-11 DIAGNOSIS — C801 Malignant (primary) neoplasm, unspecified: Secondary | ICD-10-CM | POA: Diagnosis not present

## 2016-06-11 DIAGNOSIS — C773 Secondary and unspecified malignant neoplasm of axilla and upper limb lymph nodes: Secondary | ICD-10-CM

## 2016-06-11 DIAGNOSIS — IMO0002 Reserved for concepts with insufficient information to code with codable children: Secondary | ICD-10-CM

## 2016-06-11 DIAGNOSIS — C799 Secondary malignant neoplasm of unspecified site: Secondary | ICD-10-CM

## 2016-06-11 LAB — COMPREHENSIVE METABOLIC PANEL
ALBUMIN: 3.5 g/dL (ref 3.5–5.0)
ALK PHOS: 104 U/L (ref 40–150)
ALT: 10 U/L (ref 0–55)
AST: 12 U/L (ref 5–34)
Anion Gap: 10 mEq/L (ref 3–11)
BILIRUBIN TOTAL: 0.53 mg/dL (ref 0.20–1.20)
BUN: 10.5 mg/dL (ref 7.0–26.0)
CO2: 25 mEq/L (ref 22–29)
CREATININE: 0.9 mg/dL (ref 0.7–1.3)
Calcium: 9.4 mg/dL (ref 8.4–10.4)
Chloride: 107 mEq/L (ref 98–109)
Glucose: 100 mg/dl (ref 70–140)
Potassium: 3.3 mEq/L — ABNORMAL LOW (ref 3.5–5.1)
SODIUM: 142 meq/L (ref 136–145)
TOTAL PROTEIN: 7.9 g/dL (ref 6.4–8.3)

## 2016-06-11 NOTE — Progress Notes (Signed)
Called pt to see if he had his CT testing done. Pt states that no one had set up an appt for him. Told pt that we will need to reschedule his appt today until CT is completed. Called central scheduling at [facility] to schedule pt asap. Obtained appt for Monday 06/14/16 at 8am for CT of chest/abd/pelvis and neck. Confirmed this appt with pt's wife. Pt to be nothing by mouth after midnight. Contrast to be picked up today at the cancer center. 1st bottle intake at 6am on Monday and 2nd bottle at 7am. Pt will come in today at 10am to obtain labs for CT on Monday. Pt and wife verbalized understanding and confirmed appt and instructions.

## 2016-06-11 NOTE — Assessment & Plan Note (Deleted)
Mammogram 06/11/15: No suspicious masses in the breast; in the left axilla large fixed mass was palpable measuring 4.1 x 5.3 x 4.2 cm Axillary lymph node biopsy 06/19/2015: Metastatic squamous cell carcinoma in left axillary mass  PET-CT 07/08/15: Large hypermetabolic mass in the left axilla measuring 7.4 cm with SUV 32, several smaller lymph nodes surrounding this. Emphysema, liver cysts, indeterminate structure in the left lobe of the liver without significant uptake. Liver MRI: 07/21/2015 5.2 cm benign cavernous hemangioma Left axillary LND 08/12/2015: Nodular mass of squamous cell carcinoma involving deep dermis and subcutaneous soft tissue 8.6 cm, 0/5 benign LN, no lymph nodal tissue in the mass seen skin is negative,DD: Soft tissue tumor deposit vs lymph node replaced with SCC Adjuvant radiation therapy 10/01/2015 to 11/12/2015  Plan: 1. CT neck chest abdomen pelvis was ordered but he did not undergo the testing 2. return to clinic after scans to discuss the results

## 2016-06-14 ENCOUNTER — Ambulatory Visit (HOSPITAL_COMMUNITY): Admission: RE | Admit: 2016-06-14 | Payer: PPO | Source: Ambulatory Visit

## 2016-06-14 ENCOUNTER — Telehealth: Payer: Self-pay | Admitting: Hematology and Oncology

## 2016-06-14 NOTE — Telephone Encounter (Signed)
lvm to inform pt of r/s MD appt to 3/19 at 0830 per LOS

## 2016-06-15 ENCOUNTER — Telehealth: Payer: Self-pay

## 2016-06-15 ENCOUNTER — Ambulatory Visit: Payer: No Typology Code available for payment source | Admitting: Hematology and Oncology

## 2016-06-15 NOTE — Telephone Encounter (Signed)
Called pt and spoke with wife to have pt confirm his appt for CT test on 3/15 and Dr.Gudena at [phone]. Gave wife instructions that pt will need to be NPO 4 hrs prior to scheduled CT test and to drink 1st oral contrast at 630am and 2nd bottle at 730 am. Pt wife verbalized understanding and confirmed appt time/date. Told pt wife to call if they have any more questions or concerns.

## 2016-06-17 ENCOUNTER — Ambulatory Visit (HOSPITAL_COMMUNITY)
Admission: RE | Admit: 2016-06-17 | Discharge: 2016-06-17 | Disposition: A | Discharge status: Home / Self Care | Payer: PPO | Source: Ambulatory Visit | Attending: Hematology and Oncology | Admitting: Hematology and Oncology

## 2016-06-17 ENCOUNTER — Ambulatory Visit (HOSPITAL_BASED_OUTPATIENT_CLINIC_OR_DEPARTMENT_OTHER): Payer: PPO | Admitting: Hematology and Oncology

## 2016-06-17 ENCOUNTER — Encounter (HOSPITAL_COMMUNITY): Payer: Self-pay

## 2016-06-17 ENCOUNTER — Telehealth: Payer: Self-pay | Admitting: Hematology and Oncology

## 2016-06-17 ENCOUNTER — Encounter: Payer: Self-pay | Admitting: Hematology and Oncology

## 2016-06-17 DIAGNOSIS — C761 Malignant neoplasm of thorax: Secondary | ICD-10-CM | POA: Diagnosis not present

## 2016-06-17 DIAGNOSIS — R911 Solitary pulmonary nodule: Secondary | ICD-10-CM | POA: Diagnosis not present

## 2016-06-17 DIAGNOSIS — J439 Emphysema, unspecified: Secondary | ICD-10-CM | POA: Diagnosis not present

## 2016-06-17 DIAGNOSIS — C799 Secondary malignant neoplasm of unspecified site: Secondary | ICD-10-CM | POA: Insufficient documentation

## 2016-06-17 DIAGNOSIS — I2510 Atherosclerotic heart disease of native coronary artery without angina pectoris: Secondary | ICD-10-CM | POA: Insufficient documentation

## 2016-06-17 DIAGNOSIS — N323 Diverticulum of bladder: Secondary | ICD-10-CM | POA: Insufficient documentation

## 2016-06-17 DIAGNOSIS — C773 Secondary and unspecified malignant neoplasm of axilla and upper limb lymph nodes: Secondary | ICD-10-CM | POA: Diagnosis not present

## 2016-06-17 DIAGNOSIS — C801 Malignant (primary) neoplasm, unspecified: Secondary | ICD-10-CM | POA: Diagnosis not present

## 2016-06-17 DIAGNOSIS — I700 Atherosclerosis of aorta: Secondary | ICD-10-CM | POA: Insufficient documentation

## 2016-06-17 DIAGNOSIS — IMO0002 Reserved for concepts with insufficient information to code with codable children: Secondary | ICD-10-CM

## 2016-06-17 MED ORDER — IOPAMIDOL (ISOVUE-300) INJECTION 61%
INTRAVENOUS | Status: AC
  Filled 2016-06-17: qty 100

## 2016-06-17 MED ORDER — IOPAMIDOL (ISOVUE-300) INJECTION 61%
100.0000 mL | Freq: Once | INTRAVENOUS | Status: AC | PRN
Start: 2016-06-17 — End: 2016-06-17
  Administered 2016-06-17: 100 mL via INTRAVENOUS

## 2016-06-17 NOTE — Assessment & Plan Note (Signed)
Mammogram 06/11/15: No suspicious masses in the breast; in the left axilla large fixed mass was palpable measuring 4.1 x 5.3 x 4.2 cm Axillary lymph node biopsy 06/19/2015: Metastatic squamous cell carcinoma in left axillary mass  PET-CT 07/08/15: Large hypermetabolic mass in the left axilla measuring 7.4 cm with SUV 32, several smaller lymph nodes surrounding this. Emphysema, liver cysts, indeterminate structure in the left lobe of the liver without significant uptake. Liver MRI: 07/21/2015 5.2 cm benign cavernous hemangioma Left axillary LND 08/12/2015: Nodular mass of squamous cell carcinoma involving deep dermis and subcutaneous soft tissue 8.6 cm, 0/5 benign LN, no lymph nodal tissue in the mass seen skin is negative,DD: Soft tissue tumor deposit vs lymph node replaced with SCC Adjuvant radiation therapy 10/01/2015 to 11/12/2015  CT CAP: 06/17/2016  Return to clinic in 6 months for follow-up in 1 year for scans

## 2016-06-17 NOTE — Progress Notes (Signed)
Patient Care Team: Riccardo Dubin, MD as PCP - General (Internal Medicine) Druscilla Brownie, MD as Consulting Physician (Dermatology) Nicholas Lose, MD as Consulting Physician (Hematology and Oncology) Calton Dach, MD as Referring Physician (Optometry)  DIAGNOSIS:  Encounter Diagnosis  Name Primary?  . Metastatic squamous cell carcinoma ([place])     SUMMARY OF ONCOLOGIC HISTORY:   Metastatic squamous cell carcinoma ([place])   06/11/2015 Imaging    Mammogram: No suspicious masses in the breast; in the left axilla large fixed mass was palpable measuring 4.1 x 5.3 x 4.2 cm      06/19/2015 Initial Diagnosis    Metastatic squamous cell carcinoma in left axillary mass      08/12/2015 Surgery    Left axillary LND: Nodular mass of squamous cell carcinoma involving deep dermis and subcutaneous soft tissue 8.6 cm, 0/5 benign LN, no lymph nodal tissue in the mass seen skin is negative,DD: Soft tissue tumor deposit vs lymph node replaced with [facility]      10/01/2015 - 11/12/2015 Radiation Therapy    Radiation therapy to the axilla       CHIEF COMPLIANT: Pain that goes from the left axilla down the arm into the back  INTERVAL HISTORY: Timothy Bradford is a 81 year old gentleman with metastatic squamous cell carcinoma in his left axilla underwent excision of the lymph node mass and followed by radiation. He comes in today complaining of a one-month history of pain in his left axilla radiating down his arm into the back of the chest. He had a CT chest abdomen pelvis today and is here to discuss the results. He is accompanied by his wife.  REVIEW OF SYSTEMS:   Constitutional: Denies fevers, chills or abnormal weight loss Eyes: Denies blurriness of vision Ears, nose, mouth, throat, and face: Denies mucositis or sore throat Respiratory: Denies cough, dyspnea or wheezes Cardiovascular: Denies palpitation, chest discomfort Gastrointestinal:  Denies nausea, heartburn or change in bowel habits Skin: Denies  abnormal skin rashes Lymphatics: Denies new lymphadenopathy or easy bruising Neurological:Denies numbness, tingling or new weaknesses Behavioral/Psych: Mood is stable, no new changes  Extremities: No lower extremity edema All other systems were reviewed with the patient and are negative.  I have reviewed the past medical history, past surgical history, social history and family history with the patient and they are unchanged from previous note.  ALLERGIES:  is allergic to ace inhibitors and fosinopril sodium.  MEDICATIONS:  Current Outpatient Prescriptions  Medication Sig Dispense Refill  . amLODipine (NORVASC) 2.5 MG tablet Take 2 tablets (5 mg total) by mouth daily. 30 tablet 3  . aspirin EC 81 MG tablet Take by mouth.    . diclofenac sodium (VOLTAREN) 1 % GEL Apply 2 g topically 4 (four) times daily. 100 g 2  . metoprolol tartrate (LOPRESSOR) 25 MG tablet TAKE 1 TABLET BY MOUTH TWICE A DAY 180 tablet 3  . non-metallic deodorant (ALRA) MISC Apply 1 application topically daily as needed. Reported on 10/17/2015    . simvastatin (ZOCOR) 40 MG tablet TAKE 1 TABLET BY MOUTH AT BEDTIME 90 tablet 3  . Wound Cleansers (RADIAPLEX EX) Apply topically.     No current facility-administered medications for this visit.    Facility-Administered Medications Ordered in Other Visits  Medication Dose Route Frequency Provider Last Rate Last Dose  . iopamidol (ISOVUE-300) 61 % injection             PHYSICAL EXAMINATION: ECOG PERFORMANCE STATUS: 1 - Symptomatic but completely ambulatory  Vitals:  06/17/16 0945  BP: (!) 148/96  Pulse: 68  Resp: 18  Temp: 97.7 F (36.5 C)   Filed Weights   06/17/16 0945  Weight: 186 lb 5 oz (84.5 kg)    GENERAL:alert, no distress and comfortable SKIN: skin color, texture, turgor are normal, no rashes or significant lesions EYES: normal, Conjunctiva are pink and non-injected, sclera clear OROPHARYNX:no exudate, no erythema and lips, buccal mucosa, and tongue  normal  NECK: supple, thyroid normal size, non-tender, without nodularity LYMPH:  Palpable thickening in the left axilla LUNGS: clear to auscultation and percussion with normal breathing effort HEART: regular rate & rhythm and no murmurs and no lower extremity edema ABDOMEN:abdomen soft, non-tender and normal bowel sounds MUSCULOSKELETAL:no cyanosis of digits and no clubbing  NEURO: alert & oriented x 3 with fluent speech, no focal motor/sensory deficits EXTREMITIES: No lower extremity edema  LABORATORY DATA:  I have reviewed the data as listed   Chemistry      Component Value Date/Time   NA 142 06/11/2016 0952   K 3.3 (L) 06/11/2016 0952   CL 109 08/13/2015 0534   CO2 25 06/11/2016 0952   BUN 10.5 06/11/2016 0952   CREATININE 0.9 06/11/2016 0952      Component Value Date/Time   CALCIUM 9.4 06/11/2016 0952   ALKPHOS 104 06/11/2016 0952   AST 12 06/11/2016 0952   ALT 10 06/11/2016 0952   BILITOT 0.53 06/11/2016 0952       Lab Results  Component Value Date   WBC 8.9 08/13/2015   HGB 11.0 (L) 08/13/2015   HCT 35.0 (L) 08/13/2015   MCV 92.1 08/13/2015   PLT 338 08/13/2015   NEUTROABS 3.4 05/22/2015    ASSESSMENT & PLAN:  Metastatic squamous cell carcinoma (HCC) Mammogram 06/11/15: No suspicious masses in the breast; in the left axilla large fixed mass was palpable measuring 4.1 x 5.3 x 4.2 cm Axillary lymph node biopsy 06/19/2015: Metastatic squamous cell carcinoma in left axillary mass  PET-CT 07/08/15: Large hypermetabolic mass in the left axilla measuring 7.4 cm with SUV 32, several smaller lymph nodes surrounding this. Emphysema, liver cysts, indeterminate structure in the left lobe of the liver without significant uptake. Liver MRI: 07/21/2015 5.2 cm benign cavernous hemangioma Left axillary LND 08/12/2015: Nodular mass of squamous cell carcinoma involving deep dermis and subcutaneous soft tissue 8.6 cm, 0/5 benign LN, no lymph nodal tissue in the mass seen skin is  negative,DD: Soft tissue tumor deposit vs lymph node replaced with SCC Adjuvant radiation therapy 10/01/2015 to 11/12/2015  CT CAP: 06/17/2016 3.1 cm or left axillary mass suspicious for local recurrence No other evidence of distant metastatic disease I called interventional radiology and they're willing to biopsy this under ultrasound guidance.  I would like to see him back 1 week after the biopsy to discuss the results. All the appointments should be called to his wife who is his primary caregiver.   I spent 25 minutes talking to the patient of which more than half was spent in counseling and coordination of care.  No orders of the defined types were placed in this encounter.  The patient has a good understanding of the overall plan. he agrees with it. he will call with any problems that may develop before the next visit here.   Rulon Eisenmenger, MD 06/17/16

## 2016-06-17 NOTE — Telephone Encounter (Signed)
Gave patient avs report and appointments for September.  °

## 2016-06-18 ENCOUNTER — Telehealth: Payer: Self-pay | Admitting: Emergency Medicine

## 2016-06-18 NOTE — Telephone Encounter (Signed)
Spoke with patient's spouse; she is aware of date and time of office visit on 3/29.

## 2016-06-19 ENCOUNTER — Telehealth: Payer: Self-pay | Admitting: Hematology and Oncology

## 2016-06-19 NOTE — Telephone Encounter (Signed)
Patient already on schedule for bx and f/u requested per 3/15 sch msg.

## 2016-06-21 ENCOUNTER — Ambulatory Visit: Payer: No Typology Code available for payment source | Admitting: Hematology and Oncology

## 2016-06-23 ENCOUNTER — Other Ambulatory Visit: Payer: Self-pay | Admitting: Radiology

## 2016-06-24 ENCOUNTER — Ambulatory Visit (HOSPITAL_COMMUNITY)
Admission: RE | Admit: 2016-06-24 | Discharge: 2016-06-24 | Disposition: A | Discharge status: Home / Self Care | Payer: PPO | Source: Ambulatory Visit | Attending: Hematology and Oncology | Admitting: Hematology and Oncology

## 2016-06-24 ENCOUNTER — Encounter (HOSPITAL_COMMUNITY): Payer: Self-pay

## 2016-06-24 ENCOUNTER — Ambulatory Visit (HOSPITAL_COMMUNITY): Payer: PPO | Attending: Hematology and Oncology

## 2016-06-24 ENCOUNTER — Other Ambulatory Visit: Payer: Self-pay | Admitting: Hematology and Oncology

## 2016-06-24 DIAGNOSIS — C7989 Secondary malignant neoplasm of other specified sites: Secondary | ICD-10-CM | POA: Diagnosis not present

## 2016-06-24 DIAGNOSIS — C801 Malignant (primary) neoplasm, unspecified: Secondary | ICD-10-CM | POA: Diagnosis not present

## 2016-06-24 DIAGNOSIS — C493 Malignant neoplasm of connective and soft tissue of thorax: Secondary | ICD-10-CM | POA: Diagnosis not present

## 2016-06-24 DIAGNOSIS — IMO0002 Reserved for concepts with insufficient information to code with codable children: Secondary | ICD-10-CM

## 2016-06-24 DIAGNOSIS — C799 Secondary malignant neoplasm of unspecified site: Secondary | ICD-10-CM

## 2016-06-24 DIAGNOSIS — R2232 Localized swelling, mass and lump, left upper limb: Secondary | ICD-10-CM | POA: Insufficient documentation

## 2016-06-24 DIAGNOSIS — R229 Localized swelling, mass and lump, unspecified: Secondary | ICD-10-CM | POA: Diagnosis not present

## 2016-06-24 MED ORDER — HYDROCODONE-ACETAMINOPHEN 5-325 MG PO TABS: 1.0000 | ORAL_TABLET | ORAL | Status: DC | PRN

## 2016-06-24 NOTE — Discharge Instructions (Signed)

## 2016-06-24 NOTE — Procedures (Signed)
Sq cell ca  s/p CT left axillary mass bx  No comp Stable Path pending Full report in pacs EBL 0

## 2016-07-01 ENCOUNTER — Encounter: Payer: Self-pay | Admitting: Hematology and Oncology

## 2016-07-01 ENCOUNTER — Ambulatory Visit (HOSPITAL_BASED_OUTPATIENT_CLINIC_OR_DEPARTMENT_OTHER): Payer: PPO | Admitting: Hematology and Oncology

## 2016-07-01 DIAGNOSIS — IMO0002 Reserved for concepts with insufficient information to code with codable children: Secondary | ICD-10-CM

## 2016-07-01 DIAGNOSIS — C801 Malignant (primary) neoplasm, unspecified: Secondary | ICD-10-CM

## 2016-07-01 DIAGNOSIS — C773 Secondary and unspecified malignant neoplasm of axilla and upper limb lymph nodes: Secondary | ICD-10-CM

## 2016-07-01 DIAGNOSIS — C799 Secondary malignant neoplasm of unspecified site: Secondary | ICD-10-CM

## 2016-07-01 MED ORDER — TRAMADOL HCL 50 MG PO TABS: 50.0000 mg | ORAL_TABLET | Freq: Two times a day (BID) | ORAL | 0 refills | Status: DC | PRN

## 2016-07-01 NOTE — Assessment & Plan Note (Signed)
Mammogram 06/11/15: No suspicious masses in the breast; in the left axilla large fixed mass was palpable measuring 4.1 x 5.3 x 4.2 cm Axillary lymph node biopsy 06/19/2015: Metastatic squamous cell carcinoma in left axillary mass  PET-CT 07/08/15: Large hypermetabolic mass in the left axilla measuring 7.4 cm with SUV 32, several smaller lymph nodes surrounding this. Emphysema, liver cysts, indeterminate structure in the left lobe of the liver without significant uptake. Liver MRI: 07/21/2015 5.2 cm benign cavernous hemangioma Left axillary LND 08/12/2015: Nodular mass of squamous cell carcinoma involving deep dermis and subcutaneous soft tissue 8.6 cm, 0/5 benign LN, no lymph nodal tissue in the mass seen skin is negative,DD: Soft tissue tumor deposit vs lymph node replaced with SCC Adjuvant radiation therapy 10/01/2015 to 11/12/2015  CT CAP: 06/17/2016 3.1 cm or left axillary mass suspicious for local recurrence.No other evidence of distant metastatic disease Biopsy 06/24/2016: Poorly differentiated squamous cell carcinoma  Treatment plan: Refer to surgery for excision of the recurrence Followed by systemic therapy with carboplatin and paclitaxel 4 cycles versus observation  All the appointments should be called to his wife who is his primary caregiver.

## 2016-07-01 NOTE — Progress Notes (Signed)
Patient Care Team: Riccardo Dubin, MD as PCP - General (Internal Medicine) Druscilla Brownie, MD as Consulting Physician (Dermatology) Nicholas Lose, MD as Consulting Physician (Hematology and Oncology) Calton Dach, MD as Referring Physician (Optometry)  DIAGNOSIS:  Encounter Diagnosis  Name Primary?  . Metastatic squamous cell carcinoma ([place])     SUMMARY OF ONCOLOGIC HISTORY:   Metastatic squamous cell carcinoma ([place])   06/11/2015 Imaging    Mammogram: No suspicious masses in the breast; in the left axilla large fixed mass was palpable measuring 4.1 x 5.3 x 4.2 cm      06/19/2015 Initial Diagnosis    Metastatic squamous cell carcinoma in left axillary mass      08/12/2015 Surgery    Left axillary LND: Nodular mass of squamous cell carcinoma involving deep dermis and subcutaneous soft tissue 8.6 cm, 0/5 benign LN, no lymph nodal tissue in the mass seen skin is negative,DD: Soft tissue tumor deposit vs lymph node replaced with [facility]      10/01/2015 - 11/12/2015 Radiation Therapy    Radiation therapy to the axilla       CHIEF COMPLIANT: Follow-up to discuss the recent biopsy complaining of pain in the axilla  INTERVAL HISTORY: Timothy Bradford is a 81 year old with above-mentioned history of squamous cell carcinoma the left axilla who presented with relapsed disease and a biopsy was performed and is here today to discuss the results accompanied by his wife. He complains of the pain in his axilla is pretty bad and that Tylenol is not working.  REVIEW OF SYSTEMS:   Constitutional: Denies fevers, chills or abnormal weight loss Eyes: Denies blurriness of vision Ears, nose, mouth, throat, and face: Denies mucositis or sore throat Respiratory: Denies cough, dyspnea or wheezes Cardiovascular: Denies palpitation, chest discomfort Gastrointestinal:  Denies nausea, heartburn or change in bowel habits Skin: Denies abnormal skin rashes Lymphatics: Denies new lymphadenopathy or easy  bruising Neurological:Denies numbness, tingling or new weaknesses Behavioral/Psych: Mood is stable, no new changes  Extremities: No lower extremity edema  All other systems were reviewed with the patient and are negative.  I have reviewed the past medical history, past surgical history, social history and family history with the patient and they are unchanged from previous note.  ALLERGIES:  is allergic to ace inhibitors and fosinopril sodium.  MEDICATIONS:  Current Outpatient Prescriptions  Medication Sig Dispense Refill  . amLODipine (NORVASC) 2.5 MG tablet Take 2 tablets (5 mg total) by mouth daily. 30 tablet 3  . aspirin EC 81 MG tablet Take by mouth.    . diclofenac sodium (VOLTAREN) 1 % GEL Apply 2 g topically 4 (four) times daily. 100 g 2  . metoprolol tartrate (LOPRESSOR) 25 MG tablet TAKE 1 TABLET BY MOUTH TWICE A DAY 180 tablet 3  . non-metallic deodorant (ALRA) MISC Apply 1 application topically daily as needed. Reported on 10/17/2015    . simvastatin (ZOCOR) 40 MG tablet TAKE 1 TABLET BY MOUTH AT BEDTIME 90 tablet 3  . Wound Cleansers (RADIAPLEX EX) Apply topically.     No current facility-administered medications for this visit.     PHYSICAL EXAMINATION: ECOG PERFORMANCE STATUS: 1 - Symptomatic but completely ambulatory  Vitals:   07/01/16 0857  BP: (!) 146/81  Pulse: 78  Resp: 17  Temp: 98.4 F (36.9 C)   Filed Weights   07/01/16 0857  Weight: 186 lb (84.4 kg)    GENERAL:alert, no distress and comfortable SKIN: skin color, texture, turgor are normal, no rashes or significant  lesions EYES: normal, Conjunctiva are pink and non-injected, sclera clear OROPHARYNX:no exudate, no erythema and lips, buccal mucosa, and tongue normal  NECK: supple, thyroid normal size, non-tender, without nodularity LYMPH:  no palpable lymphadenopathy in the cervical, axillary or inguinal LUNGS: clear to auscultation and percussion with normal breathing effort HEART: regular rate &  rhythm and no murmurs and no lower extremity edema ABDOMEN:abdomen soft, non-tender and normal bowel sounds MUSCULOSKELETAL:no cyanosis of digits and no clubbing  NEURO: alert & oriented x 3 with fluent speech, no focal motor/sensory deficits EXTREMITIES: No lower extremity edema   LABORATORY DATA:  I have reviewed the data as listed   Chemistry      Component Value Date/Time   NA 142 06/11/2016 0952   K 3.3 (L) 06/11/2016 0952   CL 109 08/13/2015 0534   CO2 25 06/11/2016 0952   BUN 10.5 06/11/2016 0952   CREATININE 0.9 06/11/2016 0952      Component Value Date/Time   CALCIUM 9.4 06/11/2016 0952   ALKPHOS 104 06/11/2016 0952   AST 12 06/11/2016 0952   ALT 10 06/11/2016 0952   BILITOT 0.53 06/11/2016 0952       Lab Results  Component Value Date   WBC 8.9 08/13/2015   HGB 11.0 (L) 08/13/2015   HCT 35.0 (L) 08/13/2015   MCV 92.1 08/13/2015   PLT 338 08/13/2015   NEUTROABS 3.4 05/22/2015    ASSESSMENT & PLAN:  Metastatic squamous cell carcinoma (HCC) Mammogram 06/11/15: No suspicious masses in the breast; in the left axilla large fixed mass was palpable measuring 4.1 x 5.3 x 4.2 cm Axillary lymph node biopsy 06/19/2015: Metastatic squamous cell carcinoma in left axillary mass  PET-CT 07/08/15: Large hypermetabolic mass in the left axilla measuring 7.4 cm with SUV 32, several smaller lymph nodes surrounding this. Emphysema, liver cysts, indeterminate structure in the left lobe of the liver without significant uptake. Liver MRI: 07/21/2015 5.2 cm benign cavernous hemangioma Left axillary LND 08/12/2015: Nodular mass of squamous cell carcinoma involving deep dermis and subcutaneous soft tissue 8.6 cm, 0/5 benign LN, no lymph nodal tissue in the mass seen skin is negative,DD: Soft tissue tumor deposit vs lymph node replaced with SCC Adjuvant radiation therapy 10/01/2015 to 11/12/2015  CT CAP: 06/17/2016 3.1 cm or left axillary mass suspicious for local recurrence.No other  evidence of distant metastatic disease Biopsy 06/24/2016: Poorly differentiated squamous cell carcinoma  Treatment plan: Refer to surgery for excision of the recurrence Followed by systemic therapy With cetuximab versus immunotherapy with Nivolumab versus observation  All the appointments should be called to his wife who is his primary caregiver.  I will see the patient back after surgery. I sent a message to Dr. Brantley Stage to see the patient. I spent 25 minutes talking to the patient of which more than half was spent in counseling and coordination of care.  No orders of the defined types were placed in this encounter.  The patient has a good understanding of the overall plan. he agrees with it. he will call with any problems that may develop before the next visit here.   Rulon Eisenmenger, MD 07/01/16

## 2016-07-05 ENCOUNTER — Other Ambulatory Visit: Payer: Self-pay | Admitting: Internal Medicine

## 2016-07-05 NOTE — Telephone Encounter (Signed)
As this patient was seen by me and deemed necessary for their treatment, I will refill this prescription for simvastatin.

## 2016-07-07 ENCOUNTER — Telehealth: Payer: Self-pay

## 2016-07-07 NOTE — Telephone Encounter (Signed)
Returned pt vm regarding concerns about making an appt with Dr.Cornett's office about pt having surgery in the near future. Told pt wife that Dr.Gudena had sent a message to Dr.Cornett to see the pt, but will call his office to find out for sure, since Dr.Gudena is out of the office all week this week.   Called Dr. Josetta Huddle office [phone] to find out if there was any appt made for this pt to be seen. Unable to reach dr but was able to leave a message with his nurse. Will follow up next week if pt doesn't hear anything this week.

## 2016-07-13 ENCOUNTER — Ambulatory Visit: Payer: Self-pay | Admitting: Surgery

## 2016-07-13 DIAGNOSIS — C4492 Squamous cell carcinoma of skin, unspecified: Secondary | ICD-10-CM | POA: Diagnosis not present

## 2016-07-13 NOTE — H&P (Signed)
Timothy Bradford 07/13/2016 9:14 AM Location: [facility] Patient #: [id number] DOB: Dec 01, 1935 Married / Language: English / Race: Black or African American Male  History of Present Illness Timothy Moores A. Khaleed Holan MD; 07/13/2016 9:45 AM) Patient words: Patient returns for follow-up after undergoing a left axillary lymph node dissection for metastatic squamous cell carcinoma in May 2017. He underwent radiation therapy. He was undergoing recent CT scanning staging was found to have a left axillary mass measuring 3.1 cm abutting the left third rib and the subscapularis muscle. Core biopsy showed recurrent poorly differentiated squamous cell carcinoma. He was sent by his oncologist for consideration of resection given that he has no other evidence of metastatic disease. He feels well. Denies shortness of breath or chest pain. He does have left arm lymphedema and wears a compression sleeve for that. This helps control his lymphedema. Denies any pain in his left axilla. He does have decreased range of motion of his left shoulder already. Denies abdominal pain or back pain.  The patient is a 81 year old male.   Medication History (Timothy Bradford, CMA; 07/13/2016 9:16 AM) AmLODIPine Besylate (2.5MG  Tablet, Oral) Active. TraMADol HCl (50MG  Tablet, Oral) Active. Medications Reconciled    Vitals (Timothy Bradford CMA; 07/13/2016 9:15 AM) 07/13/2016 9:14 AM Weight: 182 lb Height: 75in Body Surface Area: 2.11 m Body Mass Index: 22.75 kg/m  Pulse: 56 (Regular)  BP: 138/82 (Sitting, Left Arm, Standard)      Physical Exam (Timothy Gee A. Thekla Colborn MD; 07/13/2016 9:45 AM)  General Mental Status-Alert. General Appearance-Consistent with stated age. Hydration-Well hydrated. Voice-Normal.  Head and Neck Head-normocephalic, atraumatic with no lesions or palpable masses. Trachea-midline. Thyroid Gland Characteristics - normal size and consistency.  Eye Eyeball -  Bilateral-Extraocular movements intact. Sclera/Conjunctiva - Bilateral-No scleral icterus.  Chest and Lung Exam Chest and lung exam reveals -quiet, even and easy respiratory effort with no use of accessory muscles and on auscultation, normal breath sounds, no adventitious sounds and normal vocal resonance. Inspection Chest Wall - Normal. Back - normal.  Neurologic Neurologic evaluation reveals -alert and oriented x 3 with no impairment of recent or remote memory. Mental Status-Normal.  Musculoskeletal Normal Exam - Left-Upper Extremity Strength Normal and Lower Extremity Strength Normal. Normal Exam - Right-Upper Extremity Strength Normal and Lower Extremity Strength Normal.  Lymphatic Note: Left axilla shows scar contracture. There is an ill-defined mass just below the incision. Lymphedema left arm is 1+ but well controlled with compression garment. Decreased range of motion left arm and left shoulder. No overt neurological deficits. Right axilla is normal.    Assessment & Plan (Timothy Parish A. Ameilia Rattan MD; 07/13/2016 9:47 AM)  RECURRENT SQUAMOUS CELL CARCINOMA (C44.92) Impression: Recurrence of left axillary disease. He's undergo ne radiation therapy and axillary lymph node dissection. Reviewing the CT scan there is a 3 .1 cm mass core biopsy proven to be recurrent poorly differentiated squamous cell carcinoma in his left axilla. This is adjacent to third rib. I discussed this will not increase his lifespan to do this. I discussed the potential risk of injury to major nerves and blood vessels leading to loss of the function of left upper extremity and possible limb. He has no other evidence of disease so attempt at resection is reasonable. It does not appear to be invading the 3 rd left rib after my review but this is a possibility which would make it unresectable in this case given his severe COPD. They would like to proceed with resection of the left axillary mass which  is  recurrent squamous cell carcinoma.  Current Plans Pt Education - [employer] Free Text Education/Instructions: discussed with patient and provided information.

## 2016-07-26 ENCOUNTER — Ambulatory Visit: Payer: Medicare Other | Admitting: Hematology and Oncology

## 2016-07-28 DIAGNOSIS — Z85828 Personal history of other malignant neoplasm of skin: Secondary | ICD-10-CM | POA: Diagnosis not present

## 2016-07-28 DIAGNOSIS — L821 Other seborrheic keratosis: Secondary | ICD-10-CM | POA: Diagnosis not present

## 2016-07-29 ENCOUNTER — Encounter (HOSPITAL_COMMUNITY): Payer: Self-pay

## 2016-07-29 ENCOUNTER — Encounter (HOSPITAL_COMMUNITY)
Admission: RE | Admit: 2016-07-29 | Discharge: 2016-07-29 | Disposition: A | Discharge status: Home / Self Care | Payer: PPO | Source: Ambulatory Visit | Attending: Surgery | Admitting: Surgery

## 2016-07-29 DIAGNOSIS — Z01812 Encounter for preprocedural laboratory examination: Secondary | ICD-10-CM | POA: Insufficient documentation

## 2016-07-29 DIAGNOSIS — R9431 Abnormal electrocardiogram [ECG] [EKG]: Secondary | ICD-10-CM | POA: Diagnosis not present

## 2016-07-29 DIAGNOSIS — C4492 Squamous cell carcinoma of skin, unspecified: Secondary | ICD-10-CM | POA: Insufficient documentation

## 2016-07-29 DIAGNOSIS — Z01810 Encounter for preprocedural cardiovascular examination: Secondary | ICD-10-CM | POA: Insufficient documentation

## 2016-07-29 LAB — COMPREHENSIVE METABOLIC PANEL WITH GFR
ALT: 15 U/L — ABNORMAL LOW (ref 17–63)
AST: 20 U/L (ref 15–41)
Albumin: 3.2 g/dL — ABNORMAL LOW (ref 3.5–5.0)
Alkaline Phosphatase: 74 U/L (ref 38–126)
Anion gap: 8 (ref 5–15)
BUN: 11 mg/dL (ref 6–20)
CO2: 24 mmol/L (ref 22–32)
Calcium: 9 mg/dL (ref 8.9–10.3)
Chloride: 106 mmol/L (ref 101–111)
Creatinine, Ser: 0.92 mg/dL (ref 0.61–1.24)
GFR calc Af Amer: >60 mL/min
GFR calc non Af Amer: >60 mL/min
Glucose, Bld: 74 mg/dL (ref 65–99)
Potassium: 4.1 mmol/L (ref 3.5–5.1)
Sodium: 138 mmol/L (ref 135–145)
Total Bilirubin: 0.6 mg/dL (ref 0.3–1.2)
Total Protein: 7.4 g/dL (ref 6.5–8.1)

## 2016-07-29 LAB — CBC WITH DIFFERENTIAL/PLATELET
BASOS ABS: 0 10*3/uL (ref 0.0–0.1)
BASOS PCT: 0 %
EOS ABS: 0 10*3/uL (ref 0.0–0.7)
Eosinophils Relative: 1 %
HCT: 35.2 % — ABNORMAL LOW (ref 39.0–52.0)
Hemoglobin: 11.2 g/dL — ABNORMAL LOW (ref 13.0–17.0)
Lymphocytes Relative: 19 %
Lymphs Abs: 1.5 10*3/uL (ref 0.7–4.0)
MCH: 27.9 pg (ref 26.0–34.0)
MCHC: 31.8 g/dL (ref 30.0–36.0)
MCV: 87.6 fL (ref 78.0–100.0)
MONO ABS: 1.3 10*3/uL — AB (ref 0.1–1.0)
Monocytes Relative: 17 %
Neutro Abs: 5.1 10*3/uL (ref 1.7–7.7)
Neutrophils Relative %: 63 %
PLATELETS: 345 10*3/uL (ref 150–400)
RBC: 4.02 MIL/uL — ABNORMAL LOW (ref 4.22–5.81)
RDW: 13 % (ref 11.5–15.5)
WBC: 8 10*3/uL (ref 4.0–10.5)

## 2016-07-29 NOTE — Pre-Procedure Instructions (Signed)
Timothy Bradford  07/29/2016      [id number] [place], [place] - [place] [address] Phone: [phone] Fax: [phone]    Your procedure is scheduled on 08/05/2016  Report to [facility] Admitting at 5:30 A.M.  Call this number if you have problems the morning of surgery:  [phone]   Remember:  Do not eat food or drink liquids after midnight.            BUT- follow instructions for the BOOST DRINK                 STOP- Aspirin     Take these medicines the morning of surgery with A SIP OF WATER : Metoprolol, Amlodipine    Do not wear jewelry   Do not wear lotions, powders, or perfumes, or deoderant.    Men may shave face and neck.   Do not bring valuables to the hospital.   [facility] is not responsible for any belongings or valuables.  Contacts, dentures or bridgework may not be worn into surgery.  Leave your suitcase in the car.  After surgery it may be brought to your room.  For patients admitted to the hospital, discharge time will be determined by your treatment team.  Patients discharged the day of surgery will not be allowed to drive home.   Name and phone number of your driver:   Wife  Special instructions:  Special Instructions: [place] - Preparing for Surgery  Before surgery, you can play an important role.  Because skin is not sterile, your skin needs to be as free of germs as possible.  You can reduce the number of germs on you skin by washing with CHG (chlorahexidine gluconate) soap before surgery.  CHG is an antiseptic cleaner which kills germs and bonds with the skin to continue killing germs even after washing.  Please DO NOT use if you have an allergy to CHG or antibacterial soaps.  If your skin becomes reddened/irritated stop using the CHG and inform your nurse when you arrive at Short Stay.  Do not shave (including legs and underarms) for at  least 48 hours prior to the first CHG shower.  You may shave your face.  Please follow these instructions carefully:   1.  Shower with CHG Soap the night before surgery and the  morning of Surgery.  2.  If you choose to wash your hair, wash your hair first as usual with your  normal shampoo.  3.  After you shampoo, rinse your hair and body thoroughly to remove the  Shampoo.  4.  Use CHG as you would any other liquid soap.  You can apply chg directly to the skin and wash gently with scrungie or a clean washcloth.  5.  Apply the CHG Soap to your body ONLY FROM THE NECK DOWN.    Do not use on open wounds or open sores.  Avoid contact with your eyes, ears, mouth and genitals (private parts).  Wash genitals (private parts)   with your normal soap.  6.  Wash thoroughly, paying special attention to the area where your surgery will be performed.  7.  Thoroughly rinse your body with warm water from the neck down.  8.  DO NOT shower/wash with your normal soap after using and rinsing off   the CHG Soap.  9.  Pat yourself dry with a clean towel.  10.  Wear clean pajamas.            11.  Place clean sheets on your bed the night of your first shower and do not sleep with pets.  Day of Surgery  Do not apply any lotions/deodorants the morning of surgery.  Please wear clean clothes to the hospital/surgery center.  Please read over the following fact sheets that you were given. Pain Booklet, Coughing and Deep Breathing and Surgical Site Infection Prevention

## 2016-07-29 NOTE — Progress Notes (Signed)
Pt. Denies all chest concerns.  Pt. rec's primary care with [facility] OP - Dr. Shawnie Pons. Wife accompanies  pt.  & answers some of the questions. She reports that she was given some information regarding CHF when the pt. Last was seen for a CT scan. Noted in chart that there was a mention of CHF back in 2004. Called to EKG room while pt. Was on the monitor in PAT.  The machine was reading AV block & PVC's. Pt. Reports that he felt fine & denies any distress in his chest or breathing. Call to [place], reported all of the same. She will see the pt.

## 2016-07-29 NOTE — Progress Notes (Addendum)
Anesthesia PAT Evaluation (due to abnormal EKG, bigeminy PVCs): Patient is a 81 year old male scheduled for excision of left axillary mass on 08/05/16 by Dr. Brantley Stage. DX: SCC (metastatic).  PCP is Mickle Plumb, MD with [facility], last visit 05/28/26.   PMH includes: chronic diastolic CHF, HTN, hyperlipidemia, chronic venous insufficiency/LE edema, ED, skin cancer (SCC), left axillary lymph node dissection 08/12/15 x/p radation. Former smoker (quit '89). Former ETOH abuse (quit '99 after DT/seizures). BMI 20.67.  Medications include amlodipine, ASA 81 mg, Lopressor, Zocor, tramadol, Alra (non-metallic deodorant).   BP 122/77   Pulse 75   Temp 36.4 C   Resp 18   Ht 6' 5.5" (1.969 m)   Wt 176 lb 9.6 oz (80.1 kg)   SpO2 100%   BMI 20.67 kg/m   Heart regular underlying rhythm with some irregularity due to ectopic beats. No murmur noted. Lungs were clear. He has poor dentition with only a few teeth present. His legs have generalized edema, ~ 2+ (which is reported as chronic for years). He is A&O, but quiet and reserved in responses. Wife fills in some additional details. He denied chest pain, SOB, dizziness, syncope, palpitations. He is taking Lopressor and Norvasc as prescribed. He has lost around 10 lb since last year. About 1 1/2 weeks ago he did have a day where he felt dizzy with loss of appetite. He stayed in bed all day. He had just been prescribed Tramadol, and his wife thought this may be the cause. He is now taking Tylenol instead and those symptoms have resolved. He feels his energy level is okay, but his wife notes that he sits more around the house. He was able to walk from the hospital entrance to PAT without stopping. He walks independently, but a somewhat slow gait.   Preoperative labs noted. K 4.1. Glucose 74. H/H 11.2/35.2. PLT 345.     EKG 07/29/16: NSR, first degree AV block, frequent PVCs in a pattern of bigeminy. PVCs are new since 08/04/15. (Confirmed by EP  cardiologist Dr. Virl Axe.)  Echo 10/02/14 (done for LE edema):  - Left ventricle: The cavity size was normal. Wall thickness was normal. Systolic function was at the lower limits of normal. The estimated ejection fraction was in the range of 50% to 55%. Wall motion was normal; there were no regional wall motion abnormalities. Doppler parameters are consistent with abnormal left ventricular relaxation (grade 1 diastolic dysfunction). - Right ventricle: The cavity size was mildly dilated. Wall thickness was normal. Systolic function was mildly reduced. - Right atrium: The atrium was mildly dilated.  Cardiac cath 11/13/02 (done during admission for chest pain, ruled out MI): The LV showed mild global hypokinesia. EF of 45-50%. LVEDP was 12 mmHg. Left main was long which was patent. LAD has 10% distal stenosis.The vessel proximally was very tortuous. Diagonal one was small which was patent. Left circumflex was patent proximally and in mid portion and then tapers down in AV groove after giving off OM-2. OM-1 was large which was patent. OM-2 was small which was patent. Lourena Simmonds was patent.  CT chest/abd/pelvis 06/17/16: IMPRESSION: 1. Irregular 3.1 cm mass in the deep left axilla abutting the lateral margin of the left third rib with thick enhancing wall and heterogeneous internal enhancement, suspicious for local tumor recurrence, probably invading the left subscapularis muscle. 2. Otherwise no findings suspicious for metastatic disease in the chest, abdomen or pelvis. 3. Right upper lobe 6 mm pulmonary nodule is stable for 11 months,  probably benign. 4. Expected new mild radiation change in the peripheral left upper lung lobe. 5. Indeterminate 1.2 cm interpolar right renal mass (characterized as Bosniak category IIF on 07/21/2015 MRI), for which 11 month size stability has been demonstrated, reassuring for a benign renal lesion. 6. Aortic atherosclerosis.  One vessel coronary  atherosclerosis. 7. Moderate emphysema and diffuse bronchial wall thickening, suggesting COPD. 8. Stable small bladder diverticulum suggesting chronic bladder outlet obstruction by the enlarged prostate.  Reviewed above with anesthesiologist Dr. Sabra Heck and contacted hospital covering cardiologist Dr. Debara Pickett. Above history, EKG, subjective data discussed. Plan for patient to have a pre-operative evaluation. Dr. Debara Pickett will have his staff contact patient to schedule. In the interim, I told patient to seek medical attention if he develops syncope/pre-syncope, new SOB, CP, worsening edema. I have updated [facility] triage nurse Abigail Butts.  George Hugh [facility]/Anesthesiology Phone [phone] 07/29/2016 12:59 PM  Addendum: Patient was seen by cardiologist Dr. Candee Furbish on 08/02/16. EKG was repeated and showed NSR without ectopy. He was able to complete > 4 METS without difficulty. No CV/CHF symptoms. Dr. Marlou Porch felt patient "may proceed with surgery with low overall cardiac risk. No further cardiac testing." As needed cardiology follow-up.  George Hugh [facility] Stay [facility]/Anesthesiology Phone [phone] 08/03/2016 9:35 AM

## 2016-07-30 ENCOUNTER — Encounter: Payer: Self-pay | Admitting: Cardiology

## 2016-08-02 ENCOUNTER — Encounter: Payer: Self-pay | Admitting: *Deleted

## 2016-08-02 ENCOUNTER — Ambulatory Visit (INDEPENDENT_AMBULATORY_CARE_PROVIDER_SITE_OTHER): Payer: PPO | Admitting: Cardiology

## 2016-08-02 ENCOUNTER — Encounter: Payer: Self-pay | Admitting: Cardiology

## 2016-08-02 VITALS — BP 124/56 | HR 77 | Ht 75.0 in | Wt 176.4 lb

## 2016-08-02 DIAGNOSIS — E7800 Pure hypercholesterolemia, unspecified: Secondary | ICD-10-CM

## 2016-08-02 DIAGNOSIS — C4492 Squamous cell carcinoma of skin, unspecified: Secondary | ICD-10-CM

## 2016-08-02 DIAGNOSIS — IMO0002 Reserved for concepts with insufficient information to code with codable children: Secondary | ICD-10-CM

## 2016-08-02 DIAGNOSIS — Z87891 Personal history of nicotine dependence: Secondary | ICD-10-CM | POA: Diagnosis not present

## 2016-08-02 DIAGNOSIS — Z01810 Encounter for preprocedural cardiovascular examination: Secondary | ICD-10-CM | POA: Diagnosis not present

## 2016-08-02 NOTE — Progress Notes (Signed)
Cardiology Office Note:    Date:  08/02/2016   ID:  Timothy Bradford, DOB [birth year], MRN [id number]  PCP:  Charlott Rakes, MD  Cardiologist:  Candee Furbish, MD     Referring MD: Riccardo Dubin, MD     History of Present Illness:    Timothy Bradford is a 81 y.o. male here for preoperative risk stratification prior to excision of axillary mass on 08/05/16.  He has left axillary lymph node dissection planned for metastatic squamous cell carcinoma. He had undergone radiation therapy. Recent CT scanning staging found left axillary mass of 3.1 cm abutting his left third rib. Confirmed biopsy squamous cell cancer.  He has no difficulty walking up a flight or 2 of stairs. He could walk one block without difficulty. No syncope, no orthopnea, no PND. Low normal ejection fraction on echocardiogram 10/02/14.  Past Medical History:  Diagnosis Date  . CHF 1937   Systolic failure. Cardiac cath in 2004: The LV showed mild global hypokinesia. EF of 45-50%. LVEDP was 12 mmHg. Left main was long which was patent. LAD has 10% distal stenosis. The vessel proximally was very tortuous. Diagonal one was small which was patent. Left circumflex was patent proximally and in mid portion and then tapers down in AV groove after giving off OM-2. OM-1 was large which was p  . Chronic venous insufficiency   . Erectile dysfunction   . Hyperlipidemia   . Hypertension   . Osteoarthritis    both knees   . Seborrheic keratoses    Multiple. Sees Lyndle Herrlich  . Seizures ([place]) 1999   related to alcohol abuse    . 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 AXILLARY LYMPH NODE DISSECTION Left 08/12/2015  . AXILLARY LYMPH NODE DISSECTION Left 08/12/2015   Procedure: LEFT AXILLARY LYMPH NODE DISSECTION;  Surgeon: Erroll Luna, MD;  Location: [place];  Service: General;  Laterality: Left;  . CATARACT  EXTRACTION W/ INTRAOCULAR LENS  IMPLANT, BILATERAL Bilateral 2016  . EYE SURGERY     bilateral cataracts  . HERNIA REPAIR    . SKIN CANCER EXCISION     left shoulder 3 yrs ago    Current Medications: Current Meds  Medication Sig  . acetaminophen (TYLENOL) 500 MG tablet Take 500 mg by mouth every 6 (six) hours as needed (for pain.).  Marland Kitchen amLODipine (NORVASC) 2.5 MG tablet Take 2.5 mg by mouth daily.  Marland Kitchen aspirin EC 81 MG tablet Take 81 mg by mouth daily.   . diclofenac sodium (VOLTAREN) 1 % GEL Apply 2 g topically 4 (four) times daily.  . metoprolol tartrate (LOPRESSOR) 25 MG tablet TAKE 1 TABLET BY MOUTH TWICE A DAY  . non-metallic deodorant (ALRA) MISC Apply 1 application topically daily. Reported on 10/17/2015  . simvastatin (ZOCOR) 40 MG tablet TAKE 1 TABLET BY MOUTH AT BEDTIME     Allergies:   Ace inhibitors and Fosinopril sodium   Social History   Social History  . Marital status: Married    Spouse name: N/A  . Number of children: N/A  . Years of education: N/A   Social History Main Topics  . Smoking status: Former Smoker    Packs/day: 1.50    Years: 4.50    Types: Cigarettes    Quit date: 03/16/1988  . Smokeless tobacco: Never Used  . Alcohol use No  Comment: Quit 1999, h/o DT's and szs. No ETOH since 1999  . Drug use: No  . Sexual activity: Not Asked   Other Topics Concern  . None   Social History Narrative  . None     family history includes Stroke in his maternal grandmother. ROS:   Please see the history of present illness.   No syncope, no bleeding issues.   All other systems reviewed and are negative.   EKGs/Labs/Other Studies Reviewed:    EKG:  EKG is  ordered today.  The ekg ordered today demonstrates 08/02/16-sinus rhythm with no other abnormalities.  Recent Labs: 07/29/2016: ALT 15; BUN 11; Creatinine, Ser 0.92; Hemoglobin 11.2; Platelets 345; Potassium 4.1; Sodium 138   Recent Lipid Panel    Component Value Date/Time   CHOL 122 10/26/2012  0905   TRIG 85 10/26/2012 0905   HDL 35 (L) 10/26/2012 0905   CHOLHDL 3.5 10/26/2012 0905   VLDL 17 10/26/2012 0905   LDLCALC 70 10/26/2012 0905    Physical Exam:    VS:  BP (!) 124/56 (BP Location: Right Arm, Patient Position: Sitting, Cuff Size: Normal)   Pulse 77   Ht 6\' 3"  (1.905 m)   Wt 176 lb 6.4 oz (80 kg)   SpO2 99%   BMI 22.05 kg/m     Wt Readings from Last 3 Encounters:  08/02/16 176 lb 6.4 oz (80 kg)  07/29/16 176 lb 9.6 oz (80.1 kg)  07/01/16 186 lb (84.4 kg)     GEN:  Well nourished, well developed in no acute distress HEENT: Poor dentition NECK: No JVD; No carotid bruits LYMPHATICS: No lymphadenopathy CARDIAC: RRR, no murmurs, rubs, gallops RESPIRATORY:  Clear to auscultation without rales, wheezing or rhonchi  ABDOMEN: Soft, non-tender, non-distended MUSCULOSKELETAL:  BLE 2+ chronic appearing edema; Wearing left arm sleeve. No deformity  SKIN: Warm and dry NEUROLOGIC:  Alert and oriented x 3 PSYCHIATRIC:  Normal affect   ASSESSMENT:    1. Pre-operative cardiovascular examination   2. Former smoker   3. Pure hypercholesterolemia   4. Squamous cell carcinoma    PLAN:    In order of problems listed above:  Preoperative cardiovascular risk stratification prior to general anesthesia for left axillary squamous cell carcinoma resection.  - He is able to complete greater than 4 METS of activity without difficulty. No anginal symptoms, no heart failure symptoms, no arrhythmias. He may proceed with surgery with low overall cardiac risk. No further cardiac testing.  Former smoker  - Quit several years ago. Excellent.  Hyperlipidemia  - Continue simvastatin  Please let us know if we can be of further assistance.   Medication Adjustments/Labs and Tests Ordered: Current medicines are reviewed at length with the patient today.  Concerns regarding medicines are outlined above. Labs and tests ordered and medication changes are outlined in the patient  instructions below:  Patient Instructions  Medication Instructions:  The current medical regimen is effective;  continue present plan and medications.  Follow-Up: Follow up as needed with Dr Marlou Porch.  Thank you for choosing [facility]!!        Signed, Candee Furbish, MD  08/02/2016 3:51 PM    [place]

## 2016-08-02 NOTE — Patient Instructions (Signed)
Medication Instructions:  The current medical regimen is effective;  continue present plan and medications.  Follow-Up: Follow up as needed with Dr Skains.  Thank you for choosing [facility]!!     

## 2016-08-04 MED ORDER — CEFAZOLIN SODIUM-DEXTROSE 2-4 GM/100ML-% IV SOLN
2.0000 g | INTRAVENOUS | Status: AC
  Administered 2016-08-05: 2 g via INTRAVENOUS
  Filled 2016-08-04: qty 100

## 2016-08-05 ENCOUNTER — Ambulatory Visit (HOSPITAL_COMMUNITY): Payer: PPO | Admitting: Certified Registered Nurse Anesthetist

## 2016-08-05 ENCOUNTER — Ambulatory Visit (HOSPITAL_COMMUNITY)
Admission: RE | Admit: 2016-08-05 | Discharge: 2016-08-05 | Disposition: A | Discharge status: Home / Self Care | Payer: PPO | Source: Ambulatory Visit | Attending: Surgery | Admitting: Surgery

## 2016-08-05 ENCOUNTER — Ambulatory Visit (HOSPITAL_COMMUNITY): Payer: PPO | Admitting: Vascular Surgery

## 2016-08-05 ENCOUNTER — Encounter (HOSPITAL_COMMUNITY): Payer: Self-pay | Admitting: *Deleted

## 2016-08-05 ENCOUNTER — Encounter (HOSPITAL_COMMUNITY)
Admission: RE | Disposition: A | Discharge status: Home / Self Care | Payer: Self-pay | Source: Ambulatory Visit | Attending: Surgery

## 2016-08-05 DIAGNOSIS — Z7982 Long term (current) use of aspirin: Secondary | ICD-10-CM | POA: Diagnosis not present

## 2016-08-05 DIAGNOSIS — I890 Lymphedema, not elsewhere classified: Secondary | ICD-10-CM | POA: Insufficient documentation

## 2016-08-05 DIAGNOSIS — Z87891 Personal history of nicotine dependence: Secondary | ICD-10-CM | POA: Diagnosis not present

## 2016-08-05 DIAGNOSIS — M7989 Other specified soft tissue disorders: Secondary | ICD-10-CM | POA: Diagnosis not present

## 2016-08-05 DIAGNOSIS — Z79899 Other long term (current) drug therapy: Secondary | ICD-10-CM | POA: Insufficient documentation

## 2016-08-05 DIAGNOSIS — Z923 Personal history of irradiation: Secondary | ICD-10-CM | POA: Insufficient documentation

## 2016-08-05 DIAGNOSIS — I10 Essential (primary) hypertension: Secondary | ICD-10-CM | POA: Diagnosis not present

## 2016-08-05 DIAGNOSIS — J449 Chronic obstructive pulmonary disease, unspecified: Secondary | ICD-10-CM | POA: Insufficient documentation

## 2016-08-05 DIAGNOSIS — C44629 Squamous cell carcinoma of skin of left upper limb, including shoulder: Secondary | ICD-10-CM | POA: Diagnosis not present

## 2016-08-05 DIAGNOSIS — I509 Heart failure, unspecified: Secondary | ICD-10-CM | POA: Diagnosis not present

## 2016-08-05 DIAGNOSIS — I110 Hypertensive heart disease with heart failure: Secondary | ICD-10-CM | POA: Diagnosis not present

## 2016-08-05 DIAGNOSIS — I5032 Chronic diastolic (congestive) heart failure: Secondary | ICD-10-CM | POA: Diagnosis not present

## 2016-08-05 DIAGNOSIS — C44529 Squamous cell carcinoma of skin of other part of trunk: Secondary | ICD-10-CM | POA: Diagnosis not present

## 2016-08-05 DIAGNOSIS — C493 Malignant neoplasm of connective and soft tissue of thorax: Secondary | ICD-10-CM | POA: Diagnosis not present

## 2016-08-05 DIAGNOSIS — C4492 Squamous cell carcinoma of skin, unspecified: Secondary | ICD-10-CM | POA: Diagnosis not present

## 2016-08-05 DIAGNOSIS — E785 Hyperlipidemia, unspecified: Secondary | ICD-10-CM | POA: Diagnosis not present

## 2016-08-05 HISTORY — PX: MASS EXCISION: SHX2000

## 2016-08-05 SURGERY — EXCISION MASS
Anesthesia: General | Laterality: Left

## 2016-08-05 MED ORDER — PROPOFOL 10 MG/ML IV BOLUS
INTRAVENOUS | Status: DC | PRN
  Administered 2016-08-05: 200 mg via INTRAVENOUS

## 2016-08-05 MED ORDER — ONDANSETRON HCL 4 MG/2ML IJ SOLN
INTRAMUSCULAR | Status: DC | PRN
  Administered 2016-08-05: 4 mg via INTRAVENOUS

## 2016-08-05 MED ORDER — LIDOCAINE 2% (20 MG/ML) 5 ML SYRINGE
INTRAMUSCULAR | Status: DC | PRN
  Administered 2016-08-05: 100 mg via INTRAVENOUS

## 2016-08-05 MED ORDER — OXYCODONE HCL 5 MG PO TABS: 5.0000 mg | ORAL_TABLET | Freq: Four times a day (QID) | ORAL | 0 refills | Status: DC | PRN

## 2016-08-05 MED ORDER — CHLORHEXIDINE GLUCONATE CLOTH 2 % EX PADS: 6.0000 | MEDICATED_PAD | Freq: Once | CUTANEOUS | Status: DC

## 2016-08-05 MED ORDER — FENTANYL CITRATE (PF) 100 MCG/2ML IJ SOLN
INTRAMUSCULAR | Status: DC | PRN
  Administered 2016-08-05 (×2): 50 ug via INTRAVENOUS
  Administered 2016-08-05 (×2): 25 ug via INTRAVENOUS
  Administered 2016-08-05: 100 ug via INTRAVENOUS

## 2016-08-05 MED ORDER — ONDANSETRON HCL 4 MG/2ML IJ SOLN
INTRAMUSCULAR | Status: AC
  Filled 2016-08-05: qty 2

## 2016-08-05 MED ORDER — PHENYLEPHRINE 40 MCG/ML (10ML) SYRINGE FOR IV PUSH (FOR BLOOD PRESSURE SUPPORT)
PREFILLED_SYRINGE | INTRAVENOUS | Status: DC | PRN
  Administered 2016-08-05: 160 ug via INTRAVENOUS
  Administered 2016-08-05 (×2): 120 ug via INTRAVENOUS

## 2016-08-05 MED ORDER — LACTATED RINGERS IV SOLN
INTRAVENOUS | Status: DC | PRN
  Administered 2016-08-05: 07:00:00 via INTRAVENOUS

## 2016-08-05 MED ORDER — PROPOFOL 10 MG/ML IV BOLUS
INTRAVENOUS | Status: AC
  Filled 2016-08-05: qty 20

## 2016-08-05 MED ORDER — BUPIVACAINE HCL (PF) 0.25 % IJ SOLN
INTRAMUSCULAR | Status: DC | PRN
  Administered 2016-08-05: 30 mL

## 2016-08-05 MED ORDER — LIDOCAINE 2% (20 MG/ML) 5 ML SYRINGE
INTRAMUSCULAR | Status: AC
  Filled 2016-08-05: qty 5

## 2016-08-05 MED ORDER — 0.9 % SODIUM CHLORIDE (POUR BTL) OPTIME
TOPICAL | Status: DC | PRN
  Administered 2016-08-05: 1000 mL

## 2016-08-05 MED ORDER — PHENYLEPHRINE HCL 10 MG/ML IJ SOLN
INTRAMUSCULAR | Status: DC | PRN
  Administered 2016-08-05: 30 ug/min via INTRAVENOUS

## 2016-08-05 MED ORDER — PHENYLEPHRINE 40 MCG/ML (10ML) SYRINGE FOR IV PUSH (FOR BLOOD PRESSURE SUPPORT)
PREFILLED_SYRINGE | INTRAVENOUS | Status: AC
  Filled 2016-08-05: qty 10

## 2016-08-05 MED ORDER — HEMOSTATIC AGENTS (NO CHARGE) OPTIME
TOPICAL | Status: DC | PRN
  Administered 2016-08-05: 1 via TOPICAL

## 2016-08-05 MED ORDER — BUPIVACAINE HCL (PF) 0.25 % IJ SOLN
INTRAMUSCULAR | Status: AC
  Filled 2016-08-05: qty 30

## 2016-08-05 MED ORDER — FENTANYL CITRATE (PF) 250 MCG/5ML IJ SOLN
INTRAMUSCULAR | Status: AC
  Filled 2016-08-05: qty 5

## 2016-08-05 MED ORDER — DEXAMETHASONE SODIUM PHOSPHATE 10 MG/ML IJ SOLN
INTRAMUSCULAR | Status: DC | PRN
  Administered 2016-08-05: 10 mg via INTRAVENOUS

## 2016-08-05 MED ORDER — DEXAMETHASONE SODIUM PHOSPHATE 10 MG/ML IJ SOLN
INTRAMUSCULAR | Status: AC
  Filled 2016-08-05: qty 1

## 2016-08-05 MED ORDER — IBUPROFEN 800 MG PO TABS: 800.0000 mg | ORAL_TABLET | Freq: Three times a day (TID) | ORAL | 0 refills | Status: AC | PRN

## 2016-08-05 SURGICAL SUPPLY — 54 items
ADH SKN CLS APL DERMABOND .7 (GAUZE/BANDAGES/DRESSINGS) ×1
BLADE CLIPPER SURG (BLADE) IMPLANT
BLADE SURG 15 STRL LF DISP TIS (BLADE) IMPLANT
BLADE SURG 15 STRL SS (BLADE) ×3
CHLORAPREP W/TINT 26ML (MISCELLANEOUS) ×3 IMPLANT
COVER SURGICAL LIGHT HANDLE (MISCELLANEOUS) ×3 IMPLANT
DECANTER SPIKE VIAL GLASS SM (MISCELLANEOUS) ×3 IMPLANT
DERMABOND ADVANCED (GAUZE/BANDAGES/DRESSINGS) ×2
DERMABOND ADVANCED .7 DNX12 (GAUZE/BANDAGES/DRESSINGS) ×1 IMPLANT
DRAIN CHANNEL 19F RND (DRAIN) ×2 IMPLANT
DRAPE LAPAROSCOPIC ABDOMINAL (DRAPES) IMPLANT
DRAPE LAPAROTOMY 100X72 PEDS (DRAPES) ×2 IMPLANT
DRAPE UTILITY XL STRL (DRAPES) ×4 IMPLANT
ELECT CAUTERY BLADE 6.4 (BLADE) ×3 IMPLANT
ELECT REM PT RETURN 9FT ADLT (ELECTROSURGICAL) ×3
ELECTRODE REM PT RTRN 9FT ADLT (ELECTROSURGICAL) ×1 IMPLANT
EVACUATOR SILICONE 100CC (DRAIN) ×2 IMPLANT
GAUZE SPONGE 4X4 12PLY STRL LF (GAUZE/BANDAGES/DRESSINGS) ×2 IMPLANT
GLOVE BIO SURGEON STRL SZ8 (GLOVE) ×3 IMPLANT
GLOVE BIOGEL PI IND STRL 7.0 (GLOVE) IMPLANT
GLOVE BIOGEL PI IND STRL 7.5 (GLOVE) IMPLANT
GLOVE BIOGEL PI IND STRL 8 (GLOVE) ×1 IMPLANT
GLOVE BIOGEL PI INDICATOR 7.0 (GLOVE) ×2
GLOVE BIOGEL PI INDICATOR 7.5 (GLOVE) ×2
GLOVE BIOGEL PI INDICATOR 8 (GLOVE) ×2
GLOVE ECLIPSE 7.5 STRL STRAW (GLOVE) ×2 IMPLANT
GOWN STRL REUS W/ TWL LRG LVL3 (GOWN DISPOSABLE) ×2 IMPLANT
GOWN STRL REUS W/ TWL XL LVL3 (GOWN DISPOSABLE) ×1 IMPLANT
GOWN STRL REUS W/TWL LRG LVL3 (GOWN DISPOSABLE) ×6
GOWN STRL REUS W/TWL XL LVL3 (GOWN DISPOSABLE) ×3
HEMOSTAT SNOW SURGICEL 2X4 (HEMOSTASIS) ×2 IMPLANT
KIT BASIN OR (CUSTOM PROCEDURE TRAY) ×3 IMPLANT
KIT ROOM TURNOVER OR (KITS) ×3 IMPLANT
NDL HYPO 25GX1X1/2 BEV (NEEDLE) ×1 IMPLANT
NEEDLE HYPO 25GX1X1/2 BEV (NEEDLE) ×3 IMPLANT
NS IRRIG 1000ML POUR BTL (IV SOLUTION) ×3 IMPLANT
PACK SURGICAL SETUP 50X90 (CUSTOM PROCEDURE TRAY) ×3 IMPLANT
PAD ARMBOARD 7.5X6 YLW CONV (MISCELLANEOUS) ×3 IMPLANT
PENCIL BUTTON HOLSTER BLD 10FT (ELECTRODE) ×3 IMPLANT
SPECIMEN JAR MEDIUM (MISCELLANEOUS) ×2 IMPLANT
SPONGE LAP 18X18 X RAY DECT (DISPOSABLE) ×5 IMPLANT
SUT ETHILON 2 0 FS 18 (SUTURE) ×2 IMPLANT
SUT MNCRL AB 4-0 PS2 18 (SUTURE) ×3 IMPLANT
SUT VIC AB 2-0 SH 27 (SUTURE) ×3
SUT VIC AB 2-0 SH 27X BRD (SUTURE) ×1 IMPLANT
SUT VIC AB 3-0 SH 27 (SUTURE) ×3
SUT VIC AB 3-0 SH 27XBRD (SUTURE) ×1 IMPLANT
SYR CONTROL 10ML LL (SYRINGE) ×3 IMPLANT
TAPE CLOTH SURG 6X10 WHT LF (GAUZE/BANDAGES/DRESSINGS) ×2 IMPLANT
TOWEL OR 17X24 6PK STRL BLUE (TOWEL DISPOSABLE) ×3 IMPLANT
TOWEL OR 17X26 10 PK STRL BLUE (TOWEL DISPOSABLE) ×1 IMPLANT
TUBE CONNECTING 12'X1/4 (SUCTIONS) ×1
TUBE CONNECTING 12X1/4 (SUCTIONS) ×1 IMPLANT
YANKAUER SUCT BULB TIP NO VENT (SUCTIONS) ×2 IMPLANT

## 2016-08-05 NOTE — Interval H&P Note (Signed)
History and Physical Interval Note:  08/05/2016 6:56 AM  Timothy Bradford  has presented today for surgery, with the diagnosis of Squamous cell carcinoma  The various methods of treatment have been discussed with the patient and family. After consideration of risks, benefits and other options for treatment, the patient has consented to  Procedure(s): EXCISION LEFT AXILLARY MASS (Left) as a surgical intervention .  The patient's history has been reviewed, patient examined, no change in status, stable for surgery.  I have reviewed the patient's chart and labs.  Questions were answered to the patient's satisfaction.   The procedure has been discussed with the patient.  Alternative therapies have been discussed with the patient.  Operative risks include bleeding,  Infection,  Organ injury,  Nerve injury,  Blood vessel injury,  DVT,  Pulmonary embolism,  Death,  And possible reoperation.  Medical management risks include worsening of present situation.  The success of the procedure is 50 -90 % at treating patients symptoms.  The patient understands and agrees to proceed.  Gayle Martinez A.

## 2016-08-05 NOTE — Discharge Instructions (Signed)
Surgical Drain Home Care °Surgical drains are used to remove extra fluid that normally builds up in a surgical wound after surgery. A surgical drain helps to heal a surgical wound. Different kinds of surgical drains include: °· Active drains. These drains use suction to pull drainage away from the surgical wound. Drainage flows through a tube to a container outside of the body. It is important to keep the bulb or the drainage container flat (compressed) at all times, except while you empty it. Flattening the bulb or container creates suction. The two most common types of active drains are bulb drains and Hemovac drains. °· Passive drains. These drains allow fluid to drain naturally, by gravity. Drainage flows through a tube to a bandage (dressing) or a container outside of the body. Passive drains do not need to be emptied. The most common type of passive drain is the Penrose drain. °A drain is placed during surgery. Immediately after surgery, drainage is usually bright red and a little thicker than water. The drainage may gradually turn yellow or pink and become thinner. It is likely that your health care provider will remove the drain when the drainage stops or when the amount decreases to 1-2 Tbsp (15-30 mL) during a 24-hour period. °How to care for your surgical drain °· Keep the skin around the drain dry and covered with a dressing at all times. °· Check your drain area every day for signs of infection. Check for: °¨ More redness, swelling, or pain. °¨ Pus or a bad smell. °¨ Cloudy drainage. °Follow instructions from your health care provider about how to take care of your drain and how to change your dressing. Change your dressing at least one time every day. Change it more often if needed to keep the dressing dry. Make sure you: °1. Gather your supplies, including: °¨ Tape. °¨ Germ-free cleaning solution (sterile saline). °¨ Split gauze drain sponge: 4 x 4 inches (10 x 10 cm). °¨ Gauze square: 4 x 4 inches  (10 x 10 cm). °2. Wash your hands with soap and water before you change your dressing. If soap and water are not available, use hand sanitizer. °3. Remove the old dressing. Avoid using scissors to do that. °4. Use sterile saline to clean your skin around the drain. °5. Place the tube through the slit in a drain sponge. Place the drain sponge so that it covers your wound. °6. Place the gauze square or another drain sponge on top of the drain sponge that is on the wound. Make sure the tube is between those layers. °7. Tape the dressing to your skin. °8. If you have an active bulb or Hemovac drain, tape the drainage tube to your skin 1-2 inches (2.5-5 cm) below the place where the tube enters your body. Taping keeps the tube from pulling on any stitches (sutures) that you have. °9. Wash your hands with soap and water. °10. Write down the color of your drainage and how often you change your dressing. °How to empty your active bulb or Hemovac drain °1. Make sure that you have a measuring cup that you can empty your drainage into. °2. Wash your hands with soap and water. If soap and water are not available, use hand sanitizer. °3. Gently move your fingers down the tube while squeezing very lightly. This is called stripping the tube. This clears any drainage, clots, or tissue from the tube. °¨ Do not pull on the tube. °¨ You may need to strip the tube   several times every day to keep the tube clear. 4. Open the bulb cap or the drain plug. Do not touch the inside of the cap or the bottom of the plug. 5. Empty all of the drainage into the measuring cup. 6. Compress the bulb or the container and replace the cap or the plug. To compress the bulb or the container, squeeze it firmly in the middle while you close the cap or plug the container. 7. Write down the amount of drainage that you have in each 24-hour period. If you have less than 2 Tbsp (30 mL) of drainage during 24 hours, contact your health care provider. 8. Flush  the drainage down the toilet. 9. Wash your hands with soap and water. Contact a health care provider if:  You have more redness, swelling, or pain around your drain area.  The amount of drainage that you have is increasing instead of decreasing.  You have pus or a bad smell coming from your drain area.  You have a fever.  You have drainage that is cloudy.  There is a sudden stop or a sudden decrease in the amount of drainage that you have.  Your tube falls out.  Your active draindoes not stay compressedafter you empty it. This information is not intended to replace advice given to you by your health care provider. Make sure you discuss any questions you have with your health care provider. Document Released: 03/19/2000 Document Revised: 08/28/2015 Document Reviewed: 10/09/2014 Elsevier Interactive Patient Education  2017 [place]: POST OP INSTRUCTIONS  ######################################################################  EAT Gradually transition to a high fiber diet with a fiber supplement over the next few weeks after discharge.  Start with a pureed / full liquid diet (see below)  WALK Walk an hour a day.  Control your pain to do that.    CONTROL PAIN Control pain so that you can walk, sleep, tolerate sneezing/coughing, go up/down stairs.  HAVE A BOWEL MOVEMENT DAILY Keep your bowels regular to avoid problems.  OK to try a laxative to override constipation.  OK to use an antidairrheal to slow down diarrhea.  Call if not better after 2 tries  CALL IF YOU HAVE PROBLEMS/CONCERNS Call if you are still struggling despite following these instructions. Call if you have concerns not answered by these instructions  ######################################################################    1. DIET: Follow a light bland diet the first 24 hours after arrival home, such as soup, liquids, crackers, etc.  Be sure to include lots of fluids daily.  Avoid  fast food or heavy meals as your are more likely to get nauseated.   2. Take your usually prescribed home medications unless otherwise directed. 3. PAIN CONTROL: a. Pain is best controlled by a usual combination of three different methods TOGETHER: i. Ice/Heat ii. Over the counter pain medication iii. Prescription pain medication b. Most patients will experience some swelling and bruising around the incisions.  Ice packs or heating pads (30-60 minutes up to 6 times a day) will help. Use ice for the first few days to help decrease swelling and bruising, then switch to heat to help relax tight/sore spots and speed recovery.  Some people prefer to use ice alone, heat alone, alternating between ice & heat.  Experiment to what works for you.  Swelling and bruising can take several weeks to resolve.   c. It is helpful to take an over-the-counter pain medication regularly for the first few weeks.  Choose one of the following  that works best for you: i. Naproxen (Aleve, etc)  Two 220mg  tabs twice a day ii. Ibuprofen (Advil, etc) Three 200mg  tabs four times a day (every meal & bedtime) iii. Acetaminophen (Tylenol, etc) 500-650mg  four times a day (every meal & bedtime) d. A  prescription for pain medication (such as oxycodone, hydrocodone, etc) should be given to you upon discharge.  Take your pain medication as prescribed.  i. If you are having problems/concerns with the prescription medicine (does not control pain, nausea, vomiting, rash, itching, etc), please call us [phone] to see if we need to switch you to a different pain medicine that will work better for you and/or control your side effect better. ii. If you need a refill on your pain medication, please contact your pharmacy.  They will contact our office to request authorization. Prescriptions will not be filled after 5 pm or on week-ends. 4. Avoid getting constipated.  Between the surgery and the pain medications, it is common to experience  some constipation.  Increasing fluid intake and taking a fiber supplement (such as Metamucil, Citrucel, FiberCon, MiraLax, etc) 1-2 times a day regularly will usually help prevent this problem from occurring.  A mild laxative (prune juice, Milk of Magnesia, MiraLax, etc) should be taken according to package directions if there are no bowel movements after 48 hours.   5. Wash / shower every day.  You may shower over the dressings as they are waterproof.  Continue to shower over incision(s) after the dressing is off. 6. Remove your waterproof bandages 5 days after surgery.  You may leave the incision open to air.  You may have skin tapes (Steri Strips) covering the incision(s).  Leave them on until one week, then remove.  You may replace a dressing/Band-Aid to cover the incision for comfort if you wish.      7. ACTIVITIES as tolerated:   a. You may resume regular (light) daily activities beginning the next day--such as daily self-care, walking, climbing stairs--gradually increasing activities as tolerated.  If you can walk 30 minutes without difficulty, it is safe to try more intense activity such as jogging, treadmill, bicycling, low-impact aerobics, swimming, etc. b. Save the most intensive and strenuous activity for last such as sit-ups, heavy lifting, contact sports, etc  Refrain from any heavy lifting or straining until you are off narcotics for pain control.   c. DO NOT PUSH THROUGH PAIN.  Let pain be your guide: If it hurts to do something, don't do it.  Pain is your body warning you to avoid that activity for another week until the pain goes down. d. You may drive when you are no longer taking prescription pain medication, you can comfortably wear a seatbelt, and you can safely maneuver your car and apply brakes. e. Dennis Bast may have sexual intercourse when it is comfortable.  8. FOLLOW UP in our office a. Please call [facility] at [phone] to set up an appointment to see your surgeon in the office for  a follow-up appointment approximately 2-3 weeks after your surgery. b. Make sure that you call for this appointment the day you arrive home to insure a convenient appointment time. 9. IF YOU HAVE DISABILITY OR FAMILY LEAVE FORMS, BRING THEM TO THE OFFICE FOR PROCESSING.  DO NOT GIVE THEM TO YOUR DOCTOR.   WHEN TO CALL us [phone]: 1. Poor pain control 2. Reactions / problems with new medications (rash/itching, nausea, etc)  3. Fever over 101.5 F (38.5 C) 4. Worsening  swelling or bruising 5. Continued bleeding from incision. 6. Increased pain, redness, or drainage from the incision 7. Difficulty breathing / swallowing   The clinic staff is available to answer your questions during regular business hours (8:30am-5pm).  Please dont hesitate to call and ask to speak to one of our nurses for clinical concerns.   If you have a medical emergency, go to the nearest emergency room or call 911.  A surgeon from [facility] is always on call at the [facility], [place], [place], [place], [place]  [id number] ? MAIN: [phone] ? TOLL FREE: [phone] ?  FAX [phone] [email or web address]

## 2016-08-05 NOTE — Anesthesia Preprocedure Evaluation (Addendum)
Anesthesia Evaluation  Patient identified by MRN, date of birth, ID band Patient awake    Reviewed: Allergy & Precautions, NPO status , Patient's Chart, lab work & pertinent test results  History of Anesthesia Complications Negative for: history of anesthetic complications  Airway Mallampati: I  TM Distance: >3 FB Neck ROM: Full    Dental  (+) Poor Dentition, Missing, Dental Advisory Given   Pulmonary former smoker,    Pulmonary exam normal        Cardiovascular Exercise Tolerance: Poor hypertension, Pt. on medications and Pt. on home beta blockers +CHF  Normal cardiovascular exam Rhythm:Regular Rate:Normal     Neuro/Psych Seizures -, Well Controlled,  Seizures related to ETOH abuse > 10 years ago    GI/Hepatic negative GI ROS, Neg liver ROS,   Endo/Other    Renal/GU negative Renal ROS     Musculoskeletal   Abdominal   Peds  Hematology   Anesthesia Other Findings   Reproductive/Obstetrics                            Anesthesia Physical Anesthesia Plan  ASA: III  Anesthesia Plan: General   Post-op Pain Management:    Induction: Intravenous  Airway Management Planned: LMA  Additional Equipment:   Intra-op Plan:   Post-operative Plan: Extubation in OR  Informed Consent: I have reviewed the patients History and Physical, chart, labs and discussed the procedure including the risks, benefits and alternatives for the proposed anesthesia with the patient or authorized representative who has indicated his/her understanding and acceptance.     Plan Discussed with: CRNA and Surgeon  Anesthesia Plan Comments:        Anesthesia Quick Evaluation

## 2016-08-05 NOTE — Anesthesia Procedure Notes (Signed)
Procedure Name: LMA Insertion Date/Time: 08/05/2016 7:42 AM Performed by: Mervyn Gay Pre-anesthesia Checklist: Patient identified, Patient being monitored, Timeout performed, Emergency Drugs available and Suction available Patient Re-evaluated:Patient Re-evaluated prior to inductionOxygen Delivery Method: Circle System Utilized Preoxygenation: Pre-oxygenation with 100% oxygen Intubation Type: IV induction Ventilation: Mask ventilation without difficulty LMA: LMA inserted LMA Size: 5.0 Number of attempts: 1 Placement Confirmation: positive ETCO2 and breath sounds checked- equal and bilateral Tube secured with: Tape Dental Injury: Teeth and Oropharynx as per pre-operative assessment

## 2016-08-05 NOTE — H&P (View-Only) (Signed)
Timothy Bradford 07/13/2016 9:14 AM Location: [facility] Patient #: [id number] DOB: 1936/02/26 Married / Language: English / Race: Black or African American Male  History of Present Illness Timothy Moores A. 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 He underwent radiation therapy. He was undergoing recent CT scanning staging was found to have a left axillary mass measuring 3.1 cm abutting the left third rib and the subscapularis muscle. Core biopsy showed recurrent poorly differentiated squamous cell carcinoma. He was sent by his oncologist for consideration of resection given that he has no other evidence of metastatic disease. He feels well. Denies shortness of breath or chest pain. He does have left arm lymphedema and wears a compression sleeve for that. This helps control his lymphedema. Denies any pain in his left axilla. He does have decreased range of motion of his left shoulder already. Denies abdominal pain or back pain.  The patient is a 81 year old male.   Medication History (Alisha Spillers, CMA; 07/13/2016 9:16 AM) AmLODIPine Besylate (2.5MG  Tablet, Oral) Active. TraMADol HCl (50MG  Tablet, Oral) Active. Medications Reconciled    Vitals (Alisha Spillers CMA; 07/13/2016 9:15 AM) 07/13/2016 9:14 AM Weight: 182 lb Height: 75in Body Surface Area: 2.11 m Body Mass Index: 22.75 kg/m  Pulse: 56 (Regular)  BP: 138/82 (Sitting, Left Arm, Standard)      Physical Exam (Gurnoor Ursua A. Jennessy Sandridge MD; 07/13/2016 9:45 AM)  General Mental Status-Alert. General Appearance-Consistent with stated age. Hydration-Well hydrated. Voice-Normal.  Head and Neck Head-normocephalic, atraumatic with no lesions or palpable masses. Trachea-midline. Thyroid Gland Characteristics - normal size and consistency.  Eye Eyeball -  Bilateral-Extraocular movements intact. Sclera/Conjunctiva - Bilateral-No scleral icterus.  Chest and Lung Exam Chest and lung exam reveals -quiet, even and easy respiratory effort with no use of accessory muscles and on auscultation, normal breath sounds, no adventitious sounds and normal vocal resonance. Inspection Chest Wall - Normal. Back - normal.  Neurologic Neurologic evaluation reveals -alert and oriented x 3 with no impairment of recent or remote memory. Mental Status-Normal.  Musculoskeletal Normal Exam - Left-Upper Extremity Strength Normal and Lower Extremity Strength Normal. Normal Exam - Right-Upper Extremity Strength Normal and Lower Extremity Strength Normal.  Lymphatic Note: Left axilla shows scar contracture. There is an ill-defined mass just below the incision. Lymphedema left arm is 1+ but well controlled with compression garment. Decreased range of motion left arm and left shoulder. No overt neurological deficits. Right axilla is normal.    Assessment & Plan (Shayne Deerman A. Ilda Laskin MD; 07/13/2016 9:47 AM)  RECURRENT SQUAMOUS CELL CARCINOMA (C44.92) Impression: Recurrence of left axillary disease. He's undergo ne radiation therapy and axillary lymph node dissection. Reviewing the CT scan there is a 3 .1 cm mass core biopsy proven to be recurrent poorly differentiated squamous cell carcinoma in his left axilla. This is adjacent to third rib. I discussed this will not increase his lifespan to do this. I discussed the potential risk of injury to major nerves and blood vessels leading to loss of the function of left upper extremity and possible limb. He has no other evidence of disease so attempt at resection is reasonable. It does not appear to be invading the 3 rd left rib after my review but this is a possibility which would make it unresectable in this case given his severe COPD. They would like to proceed with resection of the left axillary mass which  is  recurrent squamous cell carcinoma.  Current Plans Pt Education - [employer] Free Text Education/Instructions: discussed with patient and provided information.

## 2016-08-05 NOTE — Op Note (Addendum)
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  Anesthesia: LMA with 0.25% Sensorcaine local  EBL: Minimal  Specimen: Multiple tissue fragments of large ulcerated fibrotic left axillary mass to pathology  Drains: 19 round drain to left axillary compartment  Indications for procedure: Patient is a 81 year old male with history of left axillary squamous cell carcinoma which was found in his left axillary lymph node basin. A primary was never located. He underwent left axillary lymph node dissection one year ago followed by radiation therapy. On recent imaging of his chest, a 3 cm mass was located in the operative basin adjacent to the left third rib and the subscapularis muscle. Core biopsy showed recurrent squamous cell carcinoma. He was referred for potential resection of this recurrent symptoms workup showed no other evidence of metastatic disease. I discussed this with him and explained to him that recurrences after radiation therapy resection may not be complete in this may be a palliative procedure to help reduce tumor burden. Also, I explained to him that if this is going into his chest wall for his brachial plexus and vascular structures adjacent to this he would be deemed unresectable due to the morbidity of this and that this procedure would not prolong his life. The goal was potential resection for local control but given the recurrence within one year success of this operation is only about 25%. The patient was aware of all this point to try to go ahead and resect and/or debulk within reason. I discussed the risk with he and his wife of bleeding, infection, loss of nerve function to the left upper extremity, major blood vessel injury, chest wall injury, pneumothorax, hemothorax, the inability to completely resect, major nerve injury  leading to weakness, or injury leading to numbness, chronic pain, wound healing secondary to previous radiation therapy, death, DVT, exacerbation of underlying medical problems.  Description of procedure: The patient was met in the holding area. Left axillary was marked as the correct side. He was taken to the operating room and placed upon the OR table. After induction of general anesthesia, the left axilla was prepped and draped in a sterile fashion. Timeout was done. Incision was made after infiltration of the old scar the left ago with local metastatic and dissection was carried and the scarred left axilla. There is significant severe scarring and fibrosis of the left axilla. The mass was not easily palpable. I created skin flaps superiorly and inferiorly. Once I did this I was able to expose the pectoralis major muscle. The large fibrotic region was dissected circumferentially and this was done down into the region of the subscapularis. The mass was fixed to the brachial plexus and subclavian vein as well as artery superiorly. Attempts to resect this was unsuccessful given this was infiltrating the structures and resection was not going to improve his survival. I elected to resect what I couldn't debulk progress. Frozen section was sent wishes surgery to severe fibrosis but he was a fairly large specimen and pathology were disabled the entire specimen. I debulked all way down taking part of the subscapularis muscle down to the chest wall and third rib. This was densely adherent to the third rib it was not deemed unresectable at this point. Given the fact that this process was infiltrating the brachial plexus as well as third rib and that this was recurrent in the setting of  previous surgery radiation therapy, I did not feel this was resected in debulked and cauterized whatever I could at this point. Hemostasis was achieved. This was densely adherent to the long thoracic nerve and the thoracodorsal trunk.  Irrigation was used. Surgicel was placed into the tumor bed after she significant preparation. Drain placed through separate stab incision using around 19 drain and secured to skin with 2-0 nylon. Wound closed with 3-0 Vicryl for Monocryl. Dermabond applied. All final counts found to be correct. The patient was awoke extubated taken to recovery in satisfactory condition.       The [place] narcotic database has been quieried and no conflicts identified.

## 2016-08-05 NOTE — Transfer of Care (Signed)
Immediate Anesthesia Transfer of Care Note  Patient: Timothy Bradford  Procedure(s) Performed: Procedure(s): EXCISION LEFT AXILLARY MASS (Left)  Patient Location: PACU  Anesthesia Type:General  Level of Consciousness: awake, alert , oriented and patient cooperative  Airway & Oxygen Therapy: Patient Spontanous Breathing and Patient connected to face mask oxygen  Post-op Assessment: Report given to RN, Post -op Vital signs reviewed and stable and Patient moving all extremities X 4  Post vital signs: Reviewed and stable  Last Vitals:  Vitals:   08/05/16 0550  BP: (!) 150/99  Pulse: 73  Resp: 18  Temp: 36.5 C    Last Pain:  Vitals:   08/05/16 0638  PainSc: 5          Complications: No apparent anesthesia complications

## 2016-08-05 NOTE — Anesthesia Postprocedure Evaluation (Signed)
Anesthesia Post Note  Patient: Timothy Bradford  Procedure(s) Performed: Procedure(s) (LRB): EXCISION LEFT AXILLARY MASS (Left)  Patient location during evaluation: PACU Anesthesia Type: General Level of consciousness: awake and alert Pain management: pain level controlled Vital Signs Assessment: post-procedure vital signs reviewed and stable Respiratory status: spontaneous breathing, nonlabored ventilation, respiratory function stable and patient connected to nasal cannula oxygen Cardiovascular status: blood pressure returned to baseline and stable Postop Assessment: no signs of nausea or vomiting Anesthetic complications: no       Last Vitals:  Vitals:   08/05/16 0945 08/05/16 1005  BP: (!) 145/78 (!) 133/91  Pulse: 73 77  Resp: 14 18  Temp: 36.7 C 36.7 C    Last Pain:  Vitals:   08/05/16 0945  PainSc: 0-No pain                 Cirilo Canner DAVID

## 2016-08-06 ENCOUNTER — Encounter (HOSPITAL_COMMUNITY): Payer: Self-pay | Admitting: Surgery

## 2016-08-11 NOTE — Addendum Note (Signed)
Addendum  created 08/11/16 1231 by Mervyn Gay, CRNA   Anesthesia Intra Flowsheets edited

## 2016-08-22 ENCOUNTER — Other Ambulatory Visit: Payer: Self-pay | Admitting: Internal Medicine

## 2016-08-25 ENCOUNTER — Telehealth: Payer: Self-pay

## 2016-08-25 NOTE — Telephone Encounter (Signed)
Returned pt wife vm regarding needing a follow up appt with Dr.Gudena. Pt wife Timothy Bradford states that the pt had his drain removed on 08/23/16 at Dr.Cornette's office. Pt is doing well except with pain management. Pt given some pain control to take at home from [place]. Erleen states that Dr.Cornette would like to speak with Dr.Gudena to give him an update of pt status and that he should see him within the next 2 weeks. Scheduled pt for lab/md on 09/06/16. Pt wife verbally confirmed time/date appt with Dr.Gudena.

## 2016-09-05 ENCOUNTER — Other Ambulatory Visit: Payer: Self-pay | Admitting: Internal Medicine

## 2016-09-05 DIAGNOSIS — I10 Essential (primary) hypertension: Secondary | ICD-10-CM

## 2016-09-05 NOTE — Assessment & Plan Note (Signed)
  08/06/16 Reexcision axillary mass: Poorly diff Squamous cell carcinoma S/P excision with positive margins (tumor stuck to underlying structures)  left axilla large fixed mass was palpable measuring 4.1 x 5.3 x 4.2 cm Axillary lymph node biopsy 06/19/2015: Metastatic squamous cell carcinoma in left axillary mass  PET-CT 07/08/15: Large hypermetabolic mass in the left axilla measuring 7.4 cm with SUV 32, several smaller lymph nodes surrounding this. Nodular mass of squamous cell carcinoma involving deep dermis and subcutaneous soft tissue 8.6 cm, 0/5 benign LN, no lymph nodal tissue in the mass seen skin is negative,DD: Soft tissue tumor deposit vs lymph node replaced with SCC Adjuvant radiation therapy 10/01/2015 to 11/12/2015  CT CAP: 06/17/2016 3.1 cmor left axillary mass suspicious for local recurrence.No other evidence of distant metastatic disease Biopsy 06/24/2016: Poorly differentiated squamous cell carcinoma.  Treatment: 1. Molecular testing 2. Systemic therapy needed No role of XRT (due to prior XRT)

## 2016-09-06 ENCOUNTER — Encounter: Payer: Self-pay | Admitting: *Deleted

## 2016-09-06 ENCOUNTER — Encounter: Payer: Self-pay | Admitting: Hematology and Oncology

## 2016-09-06 ENCOUNTER — Ambulatory Visit (HOSPITAL_BASED_OUTPATIENT_CLINIC_OR_DEPARTMENT_OTHER): Payer: PPO | Admitting: Hematology and Oncology

## 2016-09-06 DIAGNOSIS — C801 Malignant (primary) neoplasm, unspecified: Secondary | ICD-10-CM

## 2016-09-06 DIAGNOSIS — M79622 Pain in left upper arm: Secondary | ICD-10-CM | POA: Diagnosis not present

## 2016-09-06 DIAGNOSIS — C773 Secondary and unspecified malignant neoplasm of axilla and upper limb lymph nodes: Secondary | ICD-10-CM | POA: Diagnosis not present

## 2016-09-06 DIAGNOSIS — IMO0002 Reserved for concepts with insufficient information to code with codable children: Secondary | ICD-10-CM

## 2016-09-06 DIAGNOSIS — C799 Secondary malignant neoplasm of unspecified site: Secondary | ICD-10-CM

## 2016-09-06 MED ORDER — OXYCODONE-ACETAMINOPHEN 5-325 MG PO TABS: 1.0000 | ORAL_TABLET | Freq: Three times a day (TID) | ORAL | 0 refills | Status: DC | PRN

## 2016-09-06 NOTE — Telephone Encounter (Signed)
As this patient was seen by me and deemed necessary for their treatment, I will refill this prescription for amlodipine.  

## 2016-09-06 NOTE — Progress Notes (Signed)
Patient Care Team: Riccardo Dubin, MD as PCP - General (Internal Medicine) Druscilla Brownie, MD as Consulting Physician (Dermatology) Nicholas Lose, MD as Consulting Physician (Hematology and Oncology) Shirley Muscat Loreen Freud, MD as Referring Physician (Optometry)  DIAGNOSIS:  Encounter Diagnosis  Name Primary?  . Metastatic squamous cell carcinoma ([facility])     SUMMARY OF ONCOLOGIC HISTORY:   Metastatic squamous cell carcinoma ([place])   06/11/2015 Imaging    Mammogram: No suspicious masses in the breast; in the left axilla large fixed mass was palpable measuring 4.1 x 5.3 x 4.2 cm      06/19/2015 Initial Diagnosis    Metastatic squamous cell carcinoma in left axillary mass      08/12/2015 Surgery    Left axillary LND: Nodular mass of squamous cell carcinoma involving deep dermis and subcutaneous soft tissue 8.6 cm, 0/5 benign LN, no lymph nodal tissue in the mass seen skin is negative,DD: Soft tissue tumor deposit vs lymph node replaced with [facility]      10/01/2015 - 11/12/2015 Radiation Therapy    Radiation therapy to the axilla      08/05/2016 Relapse/Recurrence    Reexcision axillary mass: Poorly diff Squamous cell carcinoma S/P excision with positive margins (tumor stuck to underlying structures)       CHIEF COMPLIANT: Follow-up to discuss recent surgery  INTERVAL HISTORY: Timothy Bradford is a 81 year old gentleman with squamous cell carcinoma of the left axilla 1 had recurrence and underwent reexcision on 08/05/2016. Margins were positive. Patient continues to have severe pain and discomfort in the left shoulder. He has not been moving his arm. He is uncomfortable in the axilla. He reports that the pain can go up to 10 out of 10 but right now it is at 4 out of 10. He was previously given Neurontin and Celebrex for the pain which did not seem to be helping at all. He has been crying at nights.  REVIEW OF SYSTEMS:   Constitutional: Denies fevers, chills or abnormal weight loss Eyes: Denies  blurriness of vision Ears, nose, mouth, throat, and face: Denies mucositis or sore throat Respiratory: Denies cough, dyspnea or wheezes Cardiovascular: Denies palpitation, chest discomfort Gastrointestinal:  Denies nausea, heartburn or change in bowel habits Skin: Denies abnormal skin rashes Lymphatics: Denies new lymphadenopathy or easy bruising Neurological:Denies numbness, tingling or new weaknesses Behavioral/Psych: Mood is stable, no new changes  Extremities: Left arm pain All other systems were reviewed with the patient and are negative.  I have reviewed the past medical history, past surgical history, social history and family history with the patient and they are unchanged from previous note.  ALLERGIES:  is allergic to ace inhibitors and fosinopril sodium.  MEDICATIONS:  Current Outpatient Prescriptions  Medication Sig Dispense Refill  . acetaminophen (TYLENOL) 500 MG tablet Take 500 mg by mouth every 6 (six) hours as needed (for pain.).    Marland Kitchen amLODipine (NORVASC) 2.5 MG tablet Take 2.5 mg by mouth daily.    Marland Kitchen aspirin EC 81 MG tablet Take 81 mg by mouth daily.     . diclofenac sodium (VOLTAREN) 1 % GEL APPLY 2 GRAMS TOPICALLY 4 TIMES A DAY 100 g 2  . ibuprofen (ADVIL,MOTRIN) 800 MG tablet Take 1 tablet (800 mg total) by mouth every 8 (eight) hours as needed. 30 tablet 0  . metoprolol tartrate (LOPRESSOR) 25 MG tablet TAKE 1 TABLET BY MOUTH TWICE A DAY 180 tablet 3  . non-metallic deodorant (ALRA) MISC Apply 1 application topically daily. Reported on 10/17/2015    .  oxyCODONE (OXY IR/ROXICODONE) 5 MG immediate release tablet Take 1-2 tablets (5-10 mg total) by mouth every 6 (six) hours as needed for severe pain. 15 tablet 0  . simvastatin (ZOCOR) 40 MG tablet TAKE 1 TABLET BY MOUTH AT BEDTIME 90 tablet 3   No current facility-administered medications for this visit.     PHYSICAL EXAMINATION: ECOG PERFORMANCE STATUS: 1 - Symptomatic but completely ambulatory  Vitals:    09/06/16 1046  BP: (!) 126/47  Pulse: 78  Resp: 18  Temp: 98.6 F (37 C)   Filed Weights   09/06/16 1046  Weight: 178 lb 3.2 oz (80.8 kg)    GENERAL:alert, no distress and comfortable SKIN: skin color, texture, turgor are normal, no rashes or significant lesions EYES: normal, Conjunctiva are pink and non-injected, sclera clear OROPHARYNX:no exudate, no erythema and lips, buccal mucosa, and tongue normal  NECK: supple, thyroid normal size, non-tender, without nodularity LYMPH:  no palpable lymphadenopathy in the cervical, axillary or inguinal LUNGS: clear to auscultation and percussion with normal breathing effort HEART: regular rate & rhythm and no murmurs and no lower extremity edema ABDOMEN:abdomen soft, non-tender and normal bowel sounds MUSCULOSKELETAL:no cyanosis of digits and no clubbing  NEURO: alert & oriented x 3 with fluent speech, no focal motor/sensory deficits EXTREMITIES: Left axillary scar is still healing. There is a lot of firmness underneath the axilla. Patient is not moving his left arm because of pain issues.   LABORATORY DATA:  I have reviewed the data as listed   Chemistry      Component Value Date/Time   NA 138 07/29/2016 1130   NA 142 06/11/2016 0952   K 4.1 07/29/2016 1130   K 3.3 (L) 06/11/2016 0952   CL 106 07/29/2016 1130   CO2 24 07/29/2016 1130   CO2 25 06/11/2016 0952   BUN 11 07/29/2016 1130   BUN 10.5 06/11/2016 0952   CREATININE 0.92 07/29/2016 1130   CREATININE 0.9 06/11/2016 0952      Component Value Date/Time   CALCIUM 9.0 07/29/2016 1130   CALCIUM 9.4 06/11/2016 0952   ALKPHOS 74 07/29/2016 1130   ALKPHOS 104 06/11/2016 0952   AST 20 07/29/2016 1130   AST 12 06/11/2016 0952   ALT 15 (L) 07/29/2016 1130   ALT 10 06/11/2016 0952   BILITOT 0.6 07/29/2016 1130   BILITOT 0.53 06/11/2016 0952       Lab Results  Component Value Date   WBC 8.0 07/29/2016   HGB 11.2 (L) 07/29/2016   HCT 35.2 (L) 07/29/2016   MCV 87.6  07/29/2016   PLT 345 07/29/2016   NEUTROABS 5.1 07/29/2016    ASSESSMENT & PLAN:  Metastatic squamous cell carcinoma ([facility])  08/06/16 Reexcision axillary mass: Poorly diff Squamous cell carcinoma S/P excision with positive margins (tumor stuck to underlying structures)  left axilla large fixed mass was palpable measuring 4.1 x 5.3 x 4.2 cm Axillary lymph node biopsy 06/19/2015: Metastatic squamous cell carcinoma in left axillary mass  PET-CT 07/08/15: Large hypermetabolic mass in the left axilla measuring 7.4 cm with SUV 32, several smaller lymph nodes surrounding this. Nodular mass of squamous cell carcinoma involving deep dermis and subcutaneous soft tissue 8.6 cm, 0/5 benign LN, no lymph nodal tissue in the mass seen skin is negative,DD: Soft tissue tumor deposit vs lymph node replaced with SCC Adjuvant radiation therapy 10/01/2015 to 11/12/2015  CT CAP: 06/17/2016 3.1 cmor left axillary mass suspicious for local recurrence.No other evidence of distant metastatic disease Biopsy 06/24/2016: Poorly differentiated  squamous cell carcinoma.  Treatment: 1. Molecular testing 2. Systemic therapy either with a targeted therapy are immunotherapy on chemotherapy. No role of XRT (due to prior XRT)  Severe pain in the left axilla: Because he has 10 out of 10 pain I sent a prescription for Percocets.  Prognosis: I discussed the patient and family that if nothing is done his tumor is going to regrow back again in the axilla.  Return to clinic in 3 weeks to discuss molecular test results as well as to come up with the final treatment plan.  I spent 25 minutes talking to the patient of which more than half was spent in counseling and coordination of care.  No orders of the defined types were placed in this encounter.  The patient has a good understanding of the overall plan. he agrees with it. he will call with any problems that may develop before the next visit here.   Rulon Eisenmenger,  MD 09/06/16

## 2016-09-07 ENCOUNTER — Other Ambulatory Visit (HOSPITAL_COMMUNITY)
Admission: RE | Admit: 2016-09-07 | Discharge: 2016-09-07 | Disposition: A | Discharge status: Home / Self Care | Payer: PPO | Source: Ambulatory Visit | Attending: Hematology and Oncology | Admitting: Hematology and Oncology

## 2016-09-07 DIAGNOSIS — R229 Localized swelling, mass and lump, unspecified: Secondary | ICD-10-CM | POA: Insufficient documentation

## 2016-09-16 ENCOUNTER — Encounter (HOSPITAL_COMMUNITY): Payer: Self-pay

## 2016-09-20 ENCOUNTER — Other Ambulatory Visit: Payer: Self-pay | Admitting: General Surgery

## 2016-09-20 DIAGNOSIS — K644 Residual hemorrhoidal skin tags: Secondary | ICD-10-CM | POA: Diagnosis not present

## 2016-09-20 DIAGNOSIS — L918 Other hypertrophic disorders of the skin: Secondary | ICD-10-CM | POA: Diagnosis not present

## 2016-09-29 ENCOUNTER — Encounter (HOSPITAL_COMMUNITY): Payer: Self-pay

## 2016-09-30 ENCOUNTER — Ambulatory Visit (HOSPITAL_BASED_OUTPATIENT_CLINIC_OR_DEPARTMENT_OTHER): Payer: PPO | Admitting: Hematology and Oncology

## 2016-09-30 ENCOUNTER — Ambulatory Visit (HOSPITAL_COMMUNITY)
Admission: RE | Admit: 2016-09-30 | Discharge: 2016-09-30 | Disposition: A | Discharge status: Home / Self Care | Payer: PPO | Source: Ambulatory Visit | Attending: Hematology and Oncology | Admitting: Hematology and Oncology

## 2016-09-30 ENCOUNTER — Ambulatory Visit (HOSPITAL_BASED_OUTPATIENT_CLINIC_OR_DEPARTMENT_OTHER): Payer: PPO

## 2016-09-30 ENCOUNTER — Other Ambulatory Visit: Payer: Self-pay

## 2016-09-30 DIAGNOSIS — Z9181 History of falling: Secondary | ICD-10-CM | POA: Insufficient documentation

## 2016-09-30 DIAGNOSIS — R42 Dizziness and giddiness: Secondary | ICD-10-CM | POA: Insufficient documentation

## 2016-09-30 DIAGNOSIS — C801 Malignant (primary) neoplasm, unspecified: Secondary | ICD-10-CM

## 2016-09-30 DIAGNOSIS — I739 Peripheral vascular disease, unspecified: Secondary | ICD-10-CM | POA: Insufficient documentation

## 2016-09-30 DIAGNOSIS — C773 Secondary and unspecified malignant neoplasm of axilla and upper limb lymph nodes: Secondary | ICD-10-CM | POA: Diagnosis not present

## 2016-09-30 DIAGNOSIS — IMO0002 Reserved for concepts with insufficient information to code with codable children: Secondary | ICD-10-CM

## 2016-09-30 DIAGNOSIS — S0990XA Unspecified injury of head, initial encounter: Secondary | ICD-10-CM | POA: Diagnosis not present

## 2016-09-30 DIAGNOSIS — C799 Secondary malignant neoplasm of unspecified site: Secondary | ICD-10-CM | POA: Diagnosis not present

## 2016-09-30 LAB — CBC WITH DIFFERENTIAL/PLATELET
BASO%: 0.1 % (ref 0.0–2.0)
Basophils Absolute: 0 10*3/uL (ref 0.0–0.1)
EOS ABS: 0 10*3/uL (ref 0.0–0.5)
EOS%: 0.4 % (ref 0.0–7.0)
HCT: 35 % — ABNORMAL LOW (ref 38.4–49.9)
HGB: 10.8 g/dL — ABNORMAL LOW (ref 13.0–17.1)
LYMPH%: 15.8 % (ref 14.0–49.0)
MCH: 26.2 pg — ABNORMAL LOW (ref 27.2–33.4)
MCHC: 30.9 g/dL — AB (ref 32.0–36.0)
MCV: 85 fL (ref 79.3–98.0)
MONO#: 1.1 10*3/uL — ABNORMAL HIGH (ref 0.1–0.9)
MONO%: 11.6 % (ref 0.0–14.0)
NEUT%: 72.1 % (ref 39.0–75.0)
NEUTROS ABS: 7.1 10*3/uL — AB (ref 1.5–6.5)
Platelets: 403 10*3/uL — ABNORMAL HIGH (ref 140–400)
RBC: 4.12 10*6/uL — AB (ref 4.20–5.82)
RDW: 14.8 % — AB (ref 11.0–14.6)
WBC: 9.8 10*3/uL (ref 4.0–10.3)
lymph#: 1.6 10*3/uL (ref 0.9–3.3)
nRBC: 0 % (ref 0–0)

## 2016-09-30 LAB — COMPREHENSIVE METABOLIC PANEL
ALT: 9 U/L (ref 0–55)
AST: 14 U/L (ref 5–34)
Albumin: 2.9 g/dL — ABNORMAL LOW (ref 3.5–5.0)
Alkaline Phosphatase: 84 U/L (ref 40–150)
Anion Gap: 13 mEq/L — ABNORMAL HIGH (ref 3–11)
BILIRUBIN TOTAL: 0.39 mg/dL (ref 0.20–1.20)
BUN: 11.2 mg/dL (ref 7.0–26.0)
CHLORIDE: 105 meq/L (ref 98–109)
CO2: 25 meq/L (ref 22–29)
CREATININE: 0.9 mg/dL (ref 0.7–1.3)
Calcium: 9.5 mg/dL (ref 8.4–10.4)
EGFR: >90 mL/min/{1.73_m2} (ref >90)
GLUCOSE: 93 mg/dL (ref 70–140)
Potassium: 3.2 mEq/L — ABNORMAL LOW (ref 3.5–5.1)
SODIUM: 143 meq/L (ref 136–145)
TOTAL PROTEIN: 7.9 g/dL (ref 6.4–8.3)

## 2016-09-30 MED ORDER — POTASSIUM CHLORIDE CRYS ER 20 MEQ PO TBCR: 20.0000 meq | EXTENDED_RELEASE_TABLET | Freq: Once | ORAL | 0 refills | Status: DC

## 2016-09-30 MED ORDER — GADOBENATE DIMEGLUMINE 529 MG/ML IV SOLN
20.0000 mL | Freq: Once | INTRAVENOUS | Status: AC | PRN
  Administered 2016-09-30: 16 mL via INTRAVENOUS

## 2016-09-30 NOTE — Progress Notes (Signed)
Patient Care Team: Riccardo Dubin, MD as PCP - General (Internal Medicine) Druscilla Brownie, MD as Consulting Physician (Dermatology) Nicholas Lose, MD as Consulting Physician (Hematology and Oncology) Shirley Muscat Loreen Freud, MD as Referring Physician (Optometry)  DIAGNOSIS:  Encounter Diagnosis  Name Primary?  . Metastatic squamous cell carcinoma ([place])     SUMMARY OF ONCOLOGIC HISTORY:   Metastatic squamous cell carcinoma ([place])   06/11/2015 Imaging    Mammogram: No suspicious masses in the breast; in the left axilla large fixed mass was palpable measuring 4.1 x 5.3 x 4.2 cm      06/19/2015 Initial Diagnosis    Metastatic squamous cell carcinoma in left axillary mass      08/12/2015 Surgery    Left axillary LND: Nodular mass of squamous cell carcinoma involving deep dermis and subcutaneous soft tissue 8.6 cm, 0/5 benign LN, no lymph nodal tissue in the mass seen skin is negative,DD: Soft tissue tumor deposit vs lymph node replaced with [facility]      10/01/2015 - 11/12/2015 Radiation Therapy    Radiation therapy to the axilla      08/05/2016 Relapse/Recurrence    Reexcision axillary mass: Poorly diff Squamous cell carcinoma S/P excision with positive margins (tumor stuck to underlying structures)      09/09/2016 Procedure    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 Over the past couple of weeks is experiencing dizziness spells and he almost fell yesterday in the grocery store. He has not been feeling well has been sleeping most of the day he is not eating food and is losing weight. He requires pain medications and the don't seem to last very long for pain relief.  REVIEW OF SYSTEMS:   Constitutional: Denies  fevers, chills or abnormal weight loss Eyes: Denies blurriness of vision Ears, nose, mouth, throat, and face: Denies mucositis or sore throat Respiratory: Denies cough, dyspnea or wheezes Cardiovascular: Denies palpitation, chest discomfort Gastrointestinal:  Denies nausea, heartburn or change in bowel habits Skin: Denies abnormal skin rashes Lymphatics: Denies new lymphadenopathy or easy bruising Neurological:Denies numbness, tingling or new weaknesses Behavioral/Psych: Mood is stable, no new changes  Extremities: No lower extremity edema, left axillary pain  All other systems were reviewed with the patient and are negative.  I have reviewed the past medical history, past surgical history, social history and family history with the patient and they are unchanged from previous note.  ALLERGIES:  is allergic to ace inhibitors and fosinopril sodium.  MEDICATIONS:  Current Outpatient Prescriptions  Medication Sig Dispense Refill  . acetaminophen (TYLENOL) 500 MG tablet Take 500 mg by mouth every 6 (six) hours as needed (for pain.).    Marland Kitchen amLODipine (NORVASC) 2.5 MG tablet Take 1 tablet (2.5 mg total) by mouth daily. 90 tablet 3  . aspirin EC 81 MG tablet Take 81 mg by mouth daily.     . diclofenac sodium (VOLTAREN) 1 % GEL APPLY 2 GRAMS TOPICALLY 4 TIMES A DAY 100 g 2  . ibuprofen (ADVIL,MOTRIN) 800 MG tablet Take 1 tablet (800 mg total) by mouth every 8 (eight) hours as needed. 30 tablet 0  . metoprolol tartrate (LOPRESSOR) 25 MG tablet TAKE 1 TABLET BY MOUTH TWICE A DAY 180 tablet 3  .  non-metallic deodorant (ALRA) MISC Apply 1 application topically daily. Reported on 10/17/2015    . oxyCODONE-acetaminophen (PERCOCET/ROXICET) 5-325 MG tablet Take 1 tablet by mouth every 8 (eight) hours as needed for severe pain. 60 tablet 0  . simvastatin (ZOCOR) 40 MG tablet TAKE 1 TABLET BY MOUTH AT BEDTIME 90 tablet 3   No current facility-administered medications for this visit.     PHYSICAL  EXAMINATION: ECOG PERFORMANCE STATUS: 1 - Symptomatic but completely ambulatory  Vitals:   09/30/16 1046  BP: (!) 126/55  Pulse: 91  Resp: 17  Temp: 97.8 F (36.6 C)   Filed Weights   09/30/16 1046  Weight: 171 lb 14.4 oz (78 kg)    GENERAL:alert, no distress and comfortable SKIN: skin color, texture, turgor are normal, no rashes or significant lesions EYES: normal, Conjunctiva are pink and non-injected, sclera clear OROPHARYNX:no exudate, no erythema and lips, buccal mucosa, and tongue normal  NECK: supple, thyroid normal size, non-tender, without nodularity LYMPH:  no palpable lymphadenopathy in the cervical, axillary or inguinal LUNGS: clear to auscultation and percussion with normal breathing effort HEART: regular rate & rhythm and no murmurs and no lower extremity edema ABDOMEN:abdomen soft, non-tender and normal bowel sounds MUSCULOSKELETAL:no cyanosis of digits and no clubbing  NEURO: alert & oriented x 3 with fluent speech, no focal motor/sensory deficits EXTREMITIES: No lower extremity edema  LABORATORY DATA:  I have reviewed the data as listed   Chemistry      Component Value Date/Time   NA 138 07/29/2016 1130   NA 142 06/11/2016 0952   K 4.1 07/29/2016 1130   K 3.3 (L) 06/11/2016 0952   CL 106 07/29/2016 1130   CO2 24 07/29/2016 1130   CO2 25 06/11/2016 0952   BUN 11 07/29/2016 1130   BUN 10.5 06/11/2016 0952   CREATININE 0.92 07/29/2016 1130   CREATININE 0.9 06/11/2016 0952      Component Value Date/Time   CALCIUM 9.0 07/29/2016 1130   CALCIUM 9.4 06/11/2016 0952   ALKPHOS 74 07/29/2016 1130   ALKPHOS 104 06/11/2016 0952   AST 20 07/29/2016 1130   AST 12 06/11/2016 0952   ALT 15 (L) 07/29/2016 1130   ALT 10 06/11/2016 0952   BILITOT 0.6 07/29/2016 1130   BILITOT 0.53 06/11/2016 0952       Lab Results  Component Value Date   WBC 8.0 07/29/2016   HGB 11.2 (L) 07/29/2016   HCT 35.2 (L) 07/29/2016   MCV 87.6 07/29/2016   PLT 345 07/29/2016    NEUTROABS 5.1 07/29/2016    ASSESSMENT & PLAN:  Metastatic squamous cell carcinoma ([facility]) 08/06/16 Reexcision axillary mass: Poorly diff Squamous cell carcinoma S/P excision with positive margins (tumor stuck to underlying structures)  left axilla large fixed mass was palpable measuring 4.1 x 5.3 x 4.2 cm Axillary lymph node biopsy 06/19/2015: Metastatic squamous cell carcinoma in left axillary mass  PET-CT 07/08/15: Large hypermetabolic mass in the left axilla measuring 7.4 cm with SUV 32, several smaller lymph nodes surrounding this. Nodular mass of squamous cell carcinoma involving deep dermis and subcutaneous soft tissue 8.6 cm, 0/5 benign LN, no lymph nodal tissue in the mass seen skin is negative,DD: Soft tissue tumor deposit vs lymph node replaced with SCC Adjuvant radiation therapy 10/01/2015 to 11/12/2015  CT CAP: 06/17/2016 3.1 cmor left axillary mass suspicious for local recurrence.No other evidence of distant metastatic disease Biopsy 06/24/2016: Poorly differentiated squamous cell carcinoma.  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 We can request compassionate use Pazopanib 2. systemic chemotherapy with Taxol and carboplatin: Patient is fairly weak and would not tolerate systemic chemotherapy. No role of immunotherapy because of microsatellite stable finding If the patient decides to go on Pazopanib, we will have to request compassionate use of this drug.  Severe dizziness and falls; I will request a stat MRI brain. Return to clinic in one month for follow-up I spent 25 minutes talking to the patient of which more than half was spent in counseling and coordination of care.  No orders of the defined types were placed in this encounter.  The patient has a good understanding of the overall plan. he agrees  with it. he will call with any problems that may develop before the next visit here.   Rulon Eisenmenger, MD 09/30/16

## 2016-09-30 NOTE — Progress Notes (Signed)
Called pt to let him know that his MRI Brain was negative. Pt also notified that his potassium level was 3.2 today. Pt denies any diarrhea, but has not been eating and drinking as much as he should be. Pt states that he drinks pepsi and coffee several times a day since he quit drinking. Advised pt to cut down on caffeine intake and to increase water intake to help with hydration. Obtained potassium supplement for pt for 1 week to help with low potassium level. Advised on dietary supplement with high in potassium as well. Verbalized understanding and has no further questions at this time.

## 2016-09-30 NOTE — Progress Notes (Signed)
Per Dr.Gudena, pt scheduled for stat MRI of the Brain today at 1:30pm [facility] hospital. Pt requires labs prior to MRI. Added to lab schedule for cbc/cmp. Pt and wife aware of add on appts today and will be going to [facility] after labs drawn. Dr.Gudena will call with results today. Pt wife prefers to have [place] call her cell number since their home phone is malfunctioning.

## 2016-09-30 NOTE — Assessment & Plan Note (Signed)
08/06/16 Reexcision axillary mass: Poorly diff Squamous cell carcinoma S/P excision with positive margins (tumor stuck to underlying structures)  left axilla large fixed mass was palpable measuring 4.1 x 5.3 x 4.2 cm Axillary lymph node biopsy 06/19/2015: Metastatic squamous cell carcinoma in left axillary mass  PET-CT 07/08/15: Large hypermetabolic mass in the left axilla measuring 7.4 cm with SUV 32, several smaller lymph nodes surrounding this. Nodular mass of squamous cell carcinoma involving deep dermis and subcutaneous soft tissue 8.6 cm, 0/5 benign LN, no lymph nodal tissue in the mass seen skin is negative,DD: Soft tissue tumor deposit vs lymph node replaced with SCC Adjuvant radiation therapy 10/01/2015 to 11/12/2015  CT CAP: 06/17/2016 3.1 cmor left axillary mass suspicious for local recurrence.No other evidence of distant metastatic disease Biopsy 06/24/2016: Poorly differentiated squamous cell carcinoma.  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 We can request compassionate use Pazopanib 2. systemic chemotherapy with Taxol and carboplatin No role of immunotherapy because of microsatellite stable finding

## 2016-10-12 ENCOUNTER — Other Ambulatory Visit: Payer: Self-pay

## 2016-10-12 ENCOUNTER — Telehealth: Payer: Self-pay

## 2016-10-12 DIAGNOSIS — C799 Secondary malignant neoplasm of unspecified site: Secondary | ICD-10-CM

## 2016-10-12 DIAGNOSIS — IMO0002 Reserved for concepts with insufficient information to code with codable children: Secondary | ICD-10-CM

## 2016-10-12 NOTE — Progress Notes (Unsigned)
Received call from patient wife, Timothy Bradford, regarding pt decreasing in activity and having pain on his left side for a few days now. Pt wife states that he will not be ready for the oral chemo Pt states that the patient have been very fatigued lately and having chills on/off throughout the day. Denies any fever, sob at this time. Pt does have a non productive moist cough. Pt wife states that Timothy Bradford hasn't been getting up out of bed as much as he should and " I am worried that he could be developing pneumonia. I would like to see if Dr.Gudena can get him a chest xray and check his labs." Discussed with Dr.Gudena and thinks pt cancer has progressed and will need to think about hospice consult. Told pt wife about hospice evaluation and she is currently not ready to move forward with this decision, until she gets all her children involved in this discussion. She would like for pt to have the cxr done first and make sure its not pneumonia that is causing his left sided pain. If it is the cancer causing the pain, then she may consider moving forward with hospice care.  Scheduled pt labs and ordered cxr for tomorrow, per pt wife request. Okay to order per Dr.Gudena. Will call pt when results of cxr are final. Pt wife verbalized understanding and states that she hates thinking about hospice at this time, but will need to speak to her family about this decision and will call and let Dr.Gudena know.

## 2016-10-12 NOTE — Telephone Encounter (Signed)
na

## 2016-10-13 ENCOUNTER — Telehealth: Payer: Self-pay

## 2016-10-13 ENCOUNTER — Ambulatory Visit (HOSPITAL_COMMUNITY)
Admission: RE | Admit: 2016-10-13 | Discharge: 2016-10-13 | Disposition: A | Discharge status: Home / Self Care | Payer: PPO | Source: Ambulatory Visit | Attending: Hematology and Oncology | Admitting: Hematology and Oncology

## 2016-10-13 ENCOUNTER — Other Ambulatory Visit (HOSPITAL_BASED_OUTPATIENT_CLINIC_OR_DEPARTMENT_OTHER): Payer: PPO

## 2016-10-13 DIAGNOSIS — I7090 Unspecified atherosclerosis: Secondary | ICD-10-CM | POA: Insufficient documentation

## 2016-10-13 DIAGNOSIS — IMO0002 Reserved for concepts with insufficient information to code with codable children: Secondary | ICD-10-CM

## 2016-10-13 DIAGNOSIS — C773 Secondary and unspecified malignant neoplasm of axilla and upper limb lymph nodes: Secondary | ICD-10-CM | POA: Diagnosis not present

## 2016-10-13 DIAGNOSIS — C799 Secondary malignant neoplasm of unspecified site: Secondary | ICD-10-CM

## 2016-10-13 DIAGNOSIS — C801 Malignant (primary) neoplasm, unspecified: Secondary | ICD-10-CM | POA: Insufficient documentation

## 2016-10-13 DIAGNOSIS — J439 Emphysema, unspecified: Secondary | ICD-10-CM | POA: Diagnosis not present

## 2016-10-13 LAB — COMPREHENSIVE METABOLIC PANEL
ALT: 14 U/L (ref 0–55)
AST: 13 U/L (ref 5–34)
Albumin: 2.5 g/dL — ABNORMAL LOW (ref 3.5–5.0)
Alkaline Phosphatase: 84 U/L (ref 40–150)
Anion Gap: 10 mEq/L (ref 3–11)
BUN: 17.4 mg/dL (ref 7.0–26.0)
CHLORIDE: 105 meq/L (ref 98–109)
CO2: 22 meq/L (ref 22–29)
Calcium: 9.6 mg/dL (ref 8.4–10.4)
Creatinine: 0.9 mg/dL (ref 0.7–1.3)
GLUCOSE: 174 mg/dL — AB (ref 70–140)
POTASSIUM: 4 meq/L (ref 3.5–5.1)
SODIUM: 138 meq/L (ref 136–145)
Total Bilirubin: 0.51 mg/dL (ref 0.20–1.20)
Total Protein: 7.3 g/dL (ref 6.4–8.3)

## 2016-10-13 NOTE — Telephone Encounter (Signed)
Called pt to let her know that pt xrays were normal, along with his lab work today. Encouraged wife to have pt drink plenty of fluids and eat small meals throughout the day. Pt also encouraged to get up out of bed and move around to help prevent weakness. Pt wife will call back if she has any more questions. No other concerns at this time.

## 2016-11-27 ENCOUNTER — Encounter (HOSPITAL_COMMUNITY): Payer: Self-pay | Admitting: Emergency Medicine

## 2016-11-27 ENCOUNTER — Emergency Department (HOSPITAL_COMMUNITY): Payer: PPO

## 2016-11-27 ENCOUNTER — Inpatient Hospital Stay (HOSPITAL_COMMUNITY)
Admission: EM | Admit: 2016-11-27 | Discharge: 2016-12-03 | DRG: 871 | Disposition: A | Discharge status: Skilled Nursing Facility | Payer: PPO | Attending: Internal Medicine | Admitting: Internal Medicine

## 2016-11-27 DIAGNOSIS — M7989 Other specified soft tissue disorders: Secondary | ICD-10-CM | POA: Diagnosis not present

## 2016-11-27 DIAGNOSIS — Z87891 Personal history of nicotine dependence: Secondary | ICD-10-CM | POA: Diagnosis not present

## 2016-11-27 DIAGNOSIS — E785 Hyperlipidemia, unspecified: Secondary | ICD-10-CM

## 2016-11-27 DIAGNOSIS — I82409 Acute embolism and thrombosis of unspecified deep veins of unspecified lower extremity: Secondary | ICD-10-CM | POA: Diagnosis present

## 2016-11-27 DIAGNOSIS — Z79899 Other long term (current) drug therapy: Secondary | ICD-10-CM

## 2016-11-27 DIAGNOSIS — L89152 Pressure ulcer of sacral region, stage 2: Secondary | ICD-10-CM | POA: Diagnosis not present

## 2016-11-27 DIAGNOSIS — R338 Other retention of urine: Secondary | ICD-10-CM | POA: Diagnosis present

## 2016-11-27 DIAGNOSIS — G9341 Metabolic encephalopathy: Secondary | ICD-10-CM | POA: Diagnosis not present

## 2016-11-27 DIAGNOSIS — J90 Pleural effusion, not elsewhere classified: Secondary | ICD-10-CM | POA: Diagnosis not present

## 2016-11-27 DIAGNOSIS — R2689 Other abnormalities of gait and mobility: Secondary | ICD-10-CM | POA: Diagnosis not present

## 2016-11-27 DIAGNOSIS — C799 Secondary malignant neoplasm of unspecified site: Secondary | ICD-10-CM | POA: Diagnosis not present

## 2016-11-27 DIAGNOSIS — G893 Neoplasm related pain (acute) (chronic): Secondary | ICD-10-CM | POA: Diagnosis present

## 2016-11-27 DIAGNOSIS — E876 Hypokalemia: Secondary | ICD-10-CM | POA: Diagnosis not present

## 2016-11-27 DIAGNOSIS — C801 Malignant (primary) neoplasm, unspecified: Secondary | ICD-10-CM | POA: Diagnosis not present

## 2016-11-27 DIAGNOSIS — I10 Essential (primary) hypertension: Secondary | ICD-10-CM | POA: Diagnosis not present

## 2016-11-27 DIAGNOSIS — I82629 Acute embolism and thrombosis of deep veins of unspecified upper extremity: Secondary | ICD-10-CM | POA: Diagnosis not present

## 2016-11-27 DIAGNOSIS — Z85828 Personal history of other malignant neoplasm of skin: Secondary | ICD-10-CM

## 2016-11-27 DIAGNOSIS — I872 Venous insufficiency (chronic) (peripheral): Secondary | ICD-10-CM | POA: Diagnosis present

## 2016-11-27 DIAGNOSIS — R6889 Other general symptoms and signs: Secondary | ICD-10-CM | POA: Diagnosis not present

## 2016-11-27 DIAGNOSIS — L8990 Pressure ulcer of unspecified site, unspecified stage: Secondary | ICD-10-CM | POA: Diagnosis present

## 2016-11-27 DIAGNOSIS — J180 Bronchopneumonia, unspecified organism: Secondary | ICD-10-CM | POA: Diagnosis not present

## 2016-11-27 DIAGNOSIS — J80 Acute respiratory distress syndrome: Secondary | ICD-10-CM | POA: Diagnosis not present

## 2016-11-27 DIAGNOSIS — M109 Gout, unspecified: Secondary | ICD-10-CM | POA: Diagnosis not present

## 2016-11-27 DIAGNOSIS — I5032 Chronic diastolic (congestive) heart failure: Secondary | ICD-10-CM | POA: Diagnosis not present

## 2016-11-27 DIAGNOSIS — R410 Disorientation, unspecified: Secondary | ICD-10-CM | POA: Diagnosis not present

## 2016-11-27 DIAGNOSIS — N529 Male erectile dysfunction, unspecified: Secondary | ICD-10-CM | POA: Diagnosis present

## 2016-11-27 DIAGNOSIS — Z961 Presence of intraocular lens: Secondary | ICD-10-CM | POA: Diagnosis present

## 2016-11-27 DIAGNOSIS — E43 Unspecified severe protein-calorie malnutrition: Secondary | ICD-10-CM | POA: Diagnosis not present

## 2016-11-27 DIAGNOSIS — A419 Sepsis, unspecified organism: Principal | ICD-10-CM

## 2016-11-27 DIAGNOSIS — I951 Orthostatic hypotension: Secondary | ICD-10-CM | POA: Diagnosis not present

## 2016-11-27 DIAGNOSIS — M6281 Muscle weakness (generalized): Secondary | ICD-10-CM | POA: Diagnosis not present

## 2016-11-27 DIAGNOSIS — R339 Retention of urine, unspecified: Secondary | ICD-10-CM

## 2016-11-27 DIAGNOSIS — Z923 Personal history of irradiation: Secondary | ICD-10-CM

## 2016-11-27 DIAGNOSIS — R404 Transient alteration of awareness: Secondary | ICD-10-CM | POA: Diagnosis not present

## 2016-11-27 DIAGNOSIS — K5900 Constipation, unspecified: Secondary | ICD-10-CM | POA: Diagnosis not present

## 2016-11-27 DIAGNOSIS — I509 Heart failure, unspecified: Secondary | ICD-10-CM | POA: Diagnosis not present

## 2016-11-27 DIAGNOSIS — Z7982 Long term (current) use of aspirin: Secondary | ICD-10-CM | POA: Diagnosis not present

## 2016-11-27 DIAGNOSIS — I110 Hypertensive heart disease with heart failure: Secondary | ICD-10-CM | POA: Diagnosis not present

## 2016-11-27 DIAGNOSIS — I82622 Acute embolism and thrombosis of deep veins of left upper extremity: Secondary | ICD-10-CM | POA: Diagnosis not present

## 2016-11-27 DIAGNOSIS — L821 Other seborrheic keratosis: Secondary | ICD-10-CM | POA: Diagnosis present

## 2016-11-27 DIAGNOSIS — C792 Secondary malignant neoplasm of skin: Secondary | ICD-10-CM | POA: Diagnosis not present

## 2016-11-27 DIAGNOSIS — F339 Major depressive disorder, recurrent, unspecified: Secondary | ICD-10-CM | POA: Diagnosis not present

## 2016-11-27 DIAGNOSIS — F0390 Unspecified dementia without behavioral disturbance: Secondary | ICD-10-CM | POA: Diagnosis present

## 2016-11-27 DIAGNOSIS — Y95 Nosocomial condition: Secondary | ICD-10-CM | POA: Diagnosis present

## 2016-11-27 DIAGNOSIS — Z888 Allergy status to other drugs, medicaments and biological substances status: Secondary | ICD-10-CM

## 2016-11-27 DIAGNOSIS — C44629 Squamous cell carcinoma of skin of left upper limb, including shoulder: Secondary | ICD-10-CM | POA: Diagnosis not present

## 2016-11-27 DIAGNOSIS — J189 Pneumonia, unspecified organism: Secondary | ICD-10-CM

## 2016-11-27 DIAGNOSIS — J439 Emphysema, unspecified: Secondary | ICD-10-CM | POA: Diagnosis not present

## 2016-11-27 DIAGNOSIS — Z6823 Body mass index (BMI) 23.0-23.9, adult: Secondary | ICD-10-CM

## 2016-11-27 DIAGNOSIS — E46 Unspecified protein-calorie malnutrition: Secondary | ICD-10-CM | POA: Diagnosis not present

## 2016-11-27 LAB — COMPREHENSIVE METABOLIC PANEL
ALBUMIN: 2.1 g/dL — AB (ref 3.5–5.0)
ALK PHOS: 123 U/L (ref 38–126)
ALT: 23 U/L (ref 17–63)
AST: 32 U/L (ref 15–41)
Anion gap: 9 (ref 5–15)
BILIRUBIN TOTAL: 0.6 mg/dL (ref 0.3–1.2)
BUN: 17 mg/dL (ref 6–20)
CALCIUM: 8.6 mg/dL — AB (ref 8.9–10.3)
CO2: 23 mmol/L (ref 22–32)
CREATININE: 0.87 mg/dL (ref 0.61–1.24)
Chloride: 102 mmol/L (ref 101–111)
GFR calc non Af Amer: >60 mL/min (ref >60)
GLUCOSE: 154 mg/dL — AB (ref 65–99)
Potassium: 3.7 mmol/L (ref 3.5–5.1)
SODIUM: 134 mmol/L — AB (ref 135–145)
TOTAL PROTEIN: 6.6 g/dL (ref 6.5–8.1)

## 2016-11-27 LAB — CBC WITH DIFFERENTIAL/PLATELET
BASOS ABS: 0 10*3/uL (ref 0.0–0.1)
BASOS PCT: 0 %
EOS ABS: 0 10*3/uL (ref 0.0–0.7)
Eosinophils Relative: 0 %
HCT: 27.3 % — ABNORMAL LOW (ref 39.0–52.0)
Hemoglobin: 9 g/dL — ABNORMAL LOW (ref 13.0–17.0)
Lymphocytes Relative: 10 %
Lymphs Abs: 1.8 10*3/uL (ref 0.7–4.0)
MCH: 26.6 pg (ref 26.0–34.0)
MCHC: 33 g/dL (ref 30.0–36.0)
MCV: 80.8 fL (ref 78.0–100.0)
MONO ABS: 1.4 10*3/uL — AB (ref 0.1–1.0)
Monocytes Relative: 8 %
NEUTROS ABS: 14.3 10*3/uL — AB (ref 1.7–7.7)
Neutrophils Relative %: 82 %
PLATELETS: 522 10*3/uL — AB (ref 150–400)
RBC: 3.38 MIL/uL — ABNORMAL LOW (ref 4.22–5.81)
RDW: 16.5 % — ABNORMAL HIGH (ref 11.5–15.5)
WBC: 17.5 10*3/uL — ABNORMAL HIGH (ref 4.0–10.5)

## 2016-11-27 LAB — I-STAT CG4 LACTIC ACID, ED
LACTIC ACID, VENOUS: 1.11 mmol/L (ref 0.5–1.9)
Lactic Acid, Venous: 2.54 mmol/L (ref 0.5–1.9)

## 2016-11-27 MED ORDER — SODIUM CHLORIDE 0.9 % IV BOLUS (SEPSIS)
1000.0000 mL | Freq: Once | INTRAVENOUS | Status: AC
  Administered 2016-11-27: 1000 mL via INTRAVENOUS

## 2016-11-27 MED ORDER — SODIUM CHLORIDE 0.9 % IV SOLN
INTRAVENOUS | Status: DC
  Administered 2016-11-28: 01:00:00 via INTRAVENOUS

## 2016-11-27 MED ORDER — ENOXAPARIN SODIUM 40 MG/0.4ML ~~LOC~~ SOLN
40.0000 mg | SUBCUTANEOUS | Status: DC
  Administered 2016-11-28 – 2016-12-02 (×5): 40 mg via SUBCUTANEOUS
  Filled 2016-11-27 (×5): qty 0.4

## 2016-11-27 MED ORDER — ACETAMINOPHEN 500 MG PO TABS
500.0000 mg | ORAL_TABLET | Freq: Four times a day (QID) | ORAL | Status: DC | PRN
  Administered 2016-11-28 – 2016-12-02 (×4): 500 mg via ORAL
  Filled 2016-11-27 (×4): qty 1

## 2016-11-27 MED ORDER — SIMVASTATIN 40 MG PO TABS
40.0000 mg | ORAL_TABLET | Freq: Every day | ORAL | Status: DC
  Administered 2016-11-28 – 2016-12-02 (×5): 40 mg via ORAL
  Filled 2016-11-27 (×5): qty 1

## 2016-11-27 MED ORDER — METOPROLOL TARTRATE 25 MG PO TABS
25.0000 mg | ORAL_TABLET | Freq: Two times a day (BID) | ORAL | Status: DC
  Administered 2016-11-28 – 2016-12-01 (×7): 25 mg via ORAL
  Filled 2016-11-27 (×7): qty 1

## 2016-11-27 MED ORDER — DEXTROSE 5 % IV SOLN
500.0000 mg | Freq: Every day | INTRAVENOUS | Status: DC
  Administered 2016-11-28 – 2016-11-30 (×4): 500 mg via INTRAVENOUS
  Filled 2016-11-27 (×6): qty 500

## 2016-11-27 MED ORDER — VANCOMYCIN HCL IN DEXTROSE 1-5 GM/200ML-% IV SOLN
1000.0000 mg | Freq: Once | INTRAVENOUS | Status: AC
  Administered 2016-11-27: 1000 mg via INTRAVENOUS
  Filled 2016-11-27: qty 200

## 2016-11-27 MED ORDER — ZOLPIDEM TARTRATE 5 MG PO TABS
5.0000 mg | ORAL_TABLET | Freq: Every evening | ORAL | Status: DC | PRN
  Filled 2016-11-27: qty 1

## 2016-11-27 MED ORDER — ONDANSETRON HCL 4 MG/2ML IJ SOLN: 4.0000 mg | Freq: Three times a day (TID) | INTRAMUSCULAR | Status: DC | PRN

## 2016-11-27 MED ORDER — OXYCODONE-ACETAMINOPHEN 5-325 MG PO TABS
1.0000 | ORAL_TABLET | Freq: Three times a day (TID) | ORAL | Status: DC | PRN
  Administered 2016-11-29 – 2016-12-01 (×3): 1 via ORAL
  Filled 2016-11-27 (×4): qty 1

## 2016-11-27 MED ORDER — ASPIRIN EC 81 MG PO TBEC
81.0000 mg | DELAYED_RELEASE_TABLET | Freq: Every day | ORAL | Status: DC
  Administered 2016-11-28 – 2016-12-03 (×6): 81 mg via ORAL
  Filled 2016-11-27 (×6): qty 1

## 2016-11-27 MED ORDER — CEFEPIME HCL 2 G IJ SOLR
2.0000 g | Freq: Once | INTRAMUSCULAR | Status: AC
  Administered 2016-11-27: 2 g via INTRAVENOUS
  Filled 2016-11-27: qty 2

## 2016-11-27 MED ORDER — HYDRALAZINE HCL 20 MG/ML IJ SOLN
5.0000 mg | INTRAMUSCULAR | Status: DC | PRN
  Filled 2016-11-27: qty 0.25

## 2016-11-27 MED ORDER — ALBUTEROL SULFATE (2.5 MG/3ML) 0.083% IN NEBU: 2.5000 mg | INHALATION_SOLUTION | RESPIRATORY_TRACT | Status: DC | PRN

## 2016-11-27 MED ORDER — DM-GUAIFENESIN ER 30-600 MG PO TB12: 1.0000 | ORAL_TABLET | Freq: Two times a day (BID) | ORAL | Status: DC | PRN

## 2016-11-27 NOTE — ED Triage Notes (Signed)
This pt. Has advanced, widespread skin cancer. His family noted him today to be mildly lethargic, were concerned and so had him brought here. He is drowsy and in no distress.

## 2016-11-27 NOTE — Progress Notes (Signed)
A consult was received from an ED physician for vanc/cefepime per pharmacy dosing.  The patient's profile has been reviewed for ht/wt/allergies/indication/available labs.   A one time order has been placed for Vanc 1g and cefepime 2g.  Further antibiotics/pharmacy consults should be ordered by admitting physician if indicated.                       Thank you,  Adrian Saran, PharmD, BCPS Pager [phone] 11/27/2016 7:44 PM

## 2016-11-27 NOTE — ED Notes (Signed)
Bed: HQ60 Expected date:  Expected time:  Means of arrival:  Comments: 81 yo AMS

## 2016-11-27 NOTE — H&P (Signed)
History and Physical    Timothy Bradford QAS:[id number] DOB: Jul 04, 1935 DOA: 11/27/2016  Referring MD/NP/PA:   PCP: Lars Mage, MD   Patient coming from:  The patient is coming from home.  At baseline, pt is independent for most of ADL.   Chief Complaint: Altered mental status, cough, fever  HPI: Timothy Bradford is a 81 y.o. male with medical history significant of metastasized skin squamous cell cancer (s/p of XRT), seborrheic keratoses, hypertension, hyperlipidemia, dCHF, venous insufficiency changes, who presents with altered mental status, cough and fever.  Per patient's wife, patient has been lethargic, mildly confused in the past 4 days. He has fever, chills and cough. No SOB. Patient had left sided chest pain earlier, which has resolved. No sputum production or coughing. No runny nose or sore throat. Patient does not have nausea, vomiting, diarrhea, abdominal pain, symptoms of UTI. No unilateral weakness. Patient had difficulty urinating. Bladder scan done and had a residual of 500. Foley catheter will be placed.  Patient is a teaching service patient , but needs to be in stepdown bed  ED Course: pt was found to have WBC 17.5, lactic acid 2.54, 1.11, pending urinalysis, electrolytes renal function okay, temperature 102.5, tachycardia, tachypnea, oxygen saturation 96% on room air, blood pressure 90/56, chest x-ray showed basilar opacity for possible bronchopneumonia.  Review of Systems:   General: has fevers, chills, no body weight gain, has poor appetite, has fatigue HEENT: no blurry vision, hearing changes or sore throat Respiratory: no dyspnea, has coughing, no wheezing CV: no chest pain, no palpitations GI: no nausea, vomiting, abdominal pain, diarrhea, constipation GU: no dysuria, burning on urination, increased urinary frequency, hematuria. Had urinary retention  Ext: no leg edema Neuro: no unilateral weakness, numbness, or tingling, no vision change or hearing loss. Has  confusion. Skin: has wide spread seborrheic keratosis rashes. MSK: No muscle spasm, no deformity, no limitation of range of movement in spin Heme: No easy bruising.  Travel history: No recent long distant travel.  Allergy:  Allergies  Allergen Reactions  . Ace Inhibitors Swelling    ANDIOEDEMA  . Fosinopril Sodium Swelling    ANGIOEDEMA [ACE INHIBITOR]    Past Medical History:  Diagnosis Date  . CHF 9432   Systolic failure. Cardiac cath in 2004: The LV showed mild global hypokinesia. EF of 45-50%. LVEDP was 12 mmHg. Left main was long which was patent. LAD has 10% distal stenosis. The vessel proximally was very tortuous. Diagonal one was small which was patent. Left circumflex was patent proximally and in mid portion and then tapers down in AV groove after giving off OM-2. OM-1 was large which was p  . Chronic venous insufficiency   . Erectile dysfunction   . Hyperlipidemia   . Hypertension   . Osteoarthritis    both knees   . Seborrheic keratoses    Multiple. Sees Timothy Bradford  . Seizures ([place]) 1999   related to alcohol abuse    . 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 AXILLARY LYMPH NODE DISSECTION Left 08/12/2015  . AXILLARY LYMPH NODE DISSECTION Left 08/12/2015   Procedure: LEFT AXILLARY LYMPH NODE DISSECTION;  Surgeon: Erroll Luna, MD;  Location: [facility];  Service: General;  Laterality: Left;  . CATARACT EXTRACTION W/ INTRAOCULAR LENS  IMPLANT, BILATERAL Bilateral 2016  . EYE SURGERY     bilateral cataracts  .  HERNIA REPAIR    . MASS EXCISION Left 08/05/2016   Procedure: EXCISION LEFT AXILLARY MASS;  Surgeon: Erroll Luna, MD;  Location: [facility];  Service: General;  Laterality: Left;  . SKIN CANCER EXCISION     left shoulder 3 yrs ago    Social History:  reports that he quit smoking about 28 years ago. His smoking use included  Cigarettes. He has a 6.75 pack-year smoking history. He has never used smokeless tobacco. He reports that he does not drink alcohol or use drugs.  Family History:  Family History  Problem Relation Age of Onset  . Stroke Maternal Grandmother      Prior to Admission medications   Medication Sig Start Date End Date Taking? Authorizing Provider  acetaminophen (TYLENOL) 500 MG tablet Take 500 mg by mouth every 6 (six) hours as needed (for pain.).   Yes [provider]  amLODipine (NORVASC) 2.5 MG tablet Take 1 tablet (2.5 mg total) by mouth daily. 09/06/16 09/06/17 Yes Riccardo Dubin, MD  aspirin EC 81 MG tablet Take 81 mg by mouth daily.    Yes [provider]  diclofenac sodium (VOLTAREN) 1 % GEL APPLY 2 GRAMS TOPICALLY 4 TIMES A DAY 08/23/16  Yes Patel, Rushil V, MD  metoprolol tartrate (LOPRESSOR) 25 MG tablet TAKE 1 TABLET BY MOUTH TWICE A DAY 05/03/16  Yes Riccardo Dubin, MD  oxyCODONE-acetaminophen (PERCOCET/ROXICET) 5-325 MG tablet Take 1 tablet by mouth every 8 (eight) hours as needed for severe pain. 09/06/16  Yes Nicholas Lose, MD  simvastatin (ZOCOR) 40 MG tablet TAKE 1 TABLET BY MOUTH AT BEDTIME 07/05/16  Yes Riccardo Dubin, MD  ibuprofen (ADVIL,MOTRIN) 800 MG tablet Take 1 tablet (800 mg total) by mouth every 8 (eight) hours as needed. Patient not taking: Reported on 11/27/2016 08/05/16   Erroll Luna, MD  potassium chloride SA (K-DUR,KLOR-CON) 20 MEQ tablet Take 1 tablet (20 mEq total) by mouth once. Patient not taking: Reported on 11/27/2016 09/30/16 09/30/16  Nicholas Lose, MD    Physical Exam: Vitals:   11/28/16 0400 11/28/16 0411 11/28/16 0500 11/28/16 0600  BP: (!) 127/56  (!) 122/56 (!) 106/56  Pulse: 77  76 80  Resp: 17  (!) 22 (!) 21  Temp:  98.9 F (37.2 C)    TempSrc:  Oral    SpO2: 93%  92% 92%  Weight:      Height:       General: Not in acute distress. Dry mucus and membrane HEENT:       Eyes: PERRL, EOMI, no scleral icterus.       ENT: No  discharge from the ears and nose, no pharynx injection, no tonsillar enlargement.        Neck: No JVD, no bruit, no mass felt. Heme: No neck lymph node enlargement. Cardiac: S1/S2, RRR, No murmurs, No gallops or rubs. Respiratory:  No rales, wheezing, rhonchi or rubs. GI: Soft, nondistended, nontender, no rebound pain, no organomegaly, BS present. GU: No hematuria Ext: No pitting leg edema bilaterally. 2+DP/PT pulse bilaterally. Musculoskeletal: No joint deformities, No joint redness or warmth, no limitation of ROM in spin. Skin: has wide spread seborrheic keratosis rashes. Neuro: confused, oriented to place and persons, not time. Cranial nerves II-XII grossly intact, moves all extremities normally.  Psych: Patient is not psychotic, no suicidal or hemocidal ideation.  Labs on Admission: I have personally reviewed following labs and imaging studies  CBC:  Recent Labs Lab 11/27/16 1940  WBC 17.5*  NEUTROABS  14.3*  HGB 9.0*  HCT 27.3*  MCV 80.8  PLT 505*   Basic Metabolic Panel:  Recent Labs Lab 11/27/16 1940  NA 134*  K 3.7  CL 102  CO2 23  GLUCOSE 154*  BUN 17  CREATININE 0.87  CALCIUM 8.6*   GFR: Estimated Creatinine Clearance: 75.6 mL/min (by C-G formula based on SCr of 0.87 mg/dL). Liver Function Tests:  Recent Labs Lab 11/27/16 1940  AST 32  ALT 23  ALKPHOS 123  BILITOT 0.6  PROT 6.6  ALBUMIN 2.1*   No results for input(s): LIPASE, AMYLASE in the last 168 hours. No results for input(s): AMMONIA in the last 168 hours. Coagulation Profile:  Recent Labs Lab 11/28/16 0154  INR 1.11   Cardiac Enzymes: No results for input(s): CKTOTAL, CKMB, CKMBINDEX, TROPONINI in the last 168 hours. BNP (last 3 results) No results for input(s): PROBNP in the last 8760 hours. HbA1C: No results for input(s): HGBA1C in the last 72 hours. CBG: No results for input(s): GLUCAP in the last 168 hours. Lipid Profile: No results for input(s): CHOL, HDL, LDLCALC, TRIG,  CHOLHDL, LDLDIRECT in the last 72 hours. Thyroid Function Tests: No results for input(s): TSH, T4TOTAL, FREET4, T3FREE, THYROIDAB in the last 72 hours. Anemia Panel: No results for input(s): VITAMINB12, FOLATE, FERRITIN, TIBC, IRON, RETICCTPCT in the last 72 hours. Urine analysis:    Component Value Date/Time   COLORURINE AMBER (A) 11/27/2016 2334   APPEARANCEUR CLEAR 11/27/2016 2334   LABSPEC 1.020 11/27/2016 2334   PHURINE 6.0 11/27/2016 2334   GLUCOSEU NEGATIVE 11/27/2016 2334   GLUCOSEU NEG mg/dL 12/27/2008 0118   HGBUR NEGATIVE 11/27/2016 2334   BILIRUBINUR NEGATIVE 11/27/2016 2334   KETONESUR NEGATIVE 11/27/2016 2334   PROTEINUR NEGATIVE 11/27/2016 2334   UROBILINOGEN 0.2 12/27/2008 0118   NITRITE NEGATIVE 11/27/2016 2334   LEUKOCYTESUR NEGATIVE 11/27/2016 2334   Sepsis Labs: @LABRCNTIP (procalcitonin:4,lacticidven:4) ) Recent Results (from the past 240 hour(s))  MRSA PCR Screening     Status: None   Collection Time: 11/28/16  1:10 AM  Result Value Ref Range Status   MRSA by PCR NEGATIVE NEGATIVE Final    Comment:        The GeneXpert MRSA Assay (FDA approved for NASAL specimens only), is one component of a comprehensive MRSA colonization surveillance program. It is not intended to diagnose MRSA infection nor to guide or monitor treatment for MRSA infections.      Radiological Exams on Admission: Dg Chest Port 1 View  Result Date: 11/27/2016 CLINICAL DATA:  Fever EXAM: PORTABLE CHEST 1 VIEW COMPARISON:  10/13/2016 chest x-ray.  CT 06/24/2016. FINDINGS: Streaky bilateral lung opacities. There is pleural based density in the peripheral left chest, with overlying malignant appearing second and third rib erosions. Patient has history of axillary radiotherapy. Normal heart size and stable aortic tortuosity. Stable biapical pleural thickening. No edema, effusion, or pneumothorax. Emphysema by prior CT. Previous left axillary dissection. IMPRESSION: 1. Streaky opacities at  the bases which could be atelectasis or bronchopneumonia. 2. History of squamous cell carcinoma in the left axilla with lateral left second and third rib erosions. Neighboring subpleural density could be from previous radiotherapy or tumor. 3. Emphysema. Electronically Signed   By: Monte Fantasia M.D.   On: 11/27/2016 19:34     EKG: Independently reviewed.  Sinus rhythm, QTC 422, PVC, anteroseptal infarction pattern.  Assessment/Plan Principal Problem:   HCAP (healthcare-associated pneumonia) Active Problems:   Dyslipidemia   Essential hypertension   Chronic diastolic congestive heart failure (  [place])   Chronic venous insufficiency   Sepsis ([place])   Acute metabolic encephalopathy   Acute urinary retention   HCAP, Bronchopneumonia and sepsis: Patient meets criteria for sepsis with leukocytosis, tachycardia, tachypnea and fever. Lactic acid is elevated. Blood pressure is soft, but currently hemodynamically stable.  - will admit to SDU as inpt - IV Vancomycin and cefepime, azithromycin - Mucinex for cough  - prn Albuterol Nebs for SOB - Urine legionella and S. pneumococcal antigen - Follow up blood culture x2, sputum culture and respiratory virus panel - will get Procalcitonin and trend lactic acid level per sepsis protocol - IVF: 3L of NS bolus in ED, followed by 100 mL per hour of NS-->will bolus as needed depending on lactic acid level and Bp  HLD: -zocor  HTN: -Hold hold amlodipin due to soft Bp -Continue metoprolol  Chronic diastolic congestive heart failure ([place]): 2-D across 10/02/14 showed EF 50-55 percent with grade 1 diastolic dysfunction. Patient does not have leg edema or JVD. CHF is compensated. Patient is not taking diuretics at home. -Check BNP -Continue metoprolol and ASA  Acute urinary retention: -place Foley cath  Acute metabolic encephalopathy: Most likely due to sepsis and ongoing infection -Treat and the leg issues as above -Frequent neuro checks   DVT  ppx: SQ Lovenox Code Status: Full code Family Communication:  Yes, patient's wife and son at bed side Disposition Plan:  Anticipate discharge back to previous home environment Consults called: none  Admission status:  SDU/inpation       Date of Service 11/28/2016    Ivor Costa [facility] Pager [phone]  If 7PM-7AM, please contact night-coverage [email or web address] Password [email or web address] 11/28/2016, 6:12 AM

## 2016-11-27 NOTE — ED Provider Notes (Addendum)
[facility] DEPT Provider Note   CSN: [id number] Arrival date & time: 11/27/16  1846     History   Chief Complaint Chief Complaint  Patient presents with  . Altered Mental Status  . Fever    HPI Murvin Gift is a 81 y.o. male.  HPI Patient reportedly brought in for being mildly lethargic. Found to have fever. Patient states he feels fine. Has a history of metastatic cancer. Not currently on chemotherapy. Does have multiple other medical problems however. States he has had a cough and some left-sided lower chest pain. No diarrhea. No sore throat. No headache. Patient states he does not feel confused. Past Medical History:  Diagnosis Date  . CHF 5053   Systolic failure. Cardiac cath in 2004: The LV showed mild global hypokinesia. EF of 45-50%. LVEDP was 12 mmHg. Left main was long which was patent. LAD has 10% distal stenosis. The vessel proximally was very tortuous. Diagonal one was small which was patent. Left circumflex was patent proximally and in mid portion and then tapers down in AV groove after giving off OM-2. OM-1 was large which was p  . Chronic venous insufficiency   . Erectile dysfunction   . Hyperlipidemia   . Hypertension   . Osteoarthritis    both knees   . Seborrheic keratoses    Multiple. Sees Lyndle Herrlich  . Seizures ([place]) 1999   related to alcohol abuse    . Squamous cell cancer of skin of left shoulder Sept 2014   Removal by [facility] with clear margins    Patient Active Problem List   Diagnosis Date Noted  . Left shoulder pain 05/28/2016  . Need for immunization against influenza 02/25/2016  . Advance care planning 02/23/2016  . Metastatic squamous cell carcinoma ([place]) 06/20/2015  . Encounter for immunization 12/27/2014  . Chronic venous insufficiency   . H/O hyperglycemia 04/10/2014  . Gout 07/25/2013  . Swelling of joint, wrist, left 07/13/2013  . Seborrheic keratoses, inflamed 10/26/2012  . Health care maintenance  10/26/2012  . Chronic diastolic congestive heart failure ([place]) 12/26/2008  . OSTEOARTHRITIS 05/27/2006  . Dyslipidemia 04/26/2006  . ERECTILE DYSFUNCTION 02/09/2006  . Essential hypertension 02/09/2006    Past Surgical History:  Procedure Laterality Date  . AXILLARY LYMPH NODE DISSECTION Left 08/12/2015  . AXILLARY LYMPH NODE DISSECTION Left 08/12/2015   Procedure: LEFT AXILLARY LYMPH NODE DISSECTION;  Surgeon: Erroll Luna, MD;  Location: [facility];  Service: General;  Laterality: Left;  . CATARACT EXTRACTION W/ INTRAOCULAR LENS  IMPLANT, BILATERAL Bilateral 2016  . EYE SURGERY     bilateral cataracts  . HERNIA REPAIR    . MASS EXCISION Left 08/05/2016   Procedure: EXCISION LEFT AXILLARY MASS;  Surgeon: Erroll Luna, MD;  Location: [facility];  Service: General;  Laterality: Left;  . SKIN CANCER EXCISION     left shoulder 3 yrs ago       Home Medications    Prior to Admission medications   Medication Sig Start Date End Date Taking? Authorizing Provider  acetaminophen (TYLENOL) 500 MG tablet Take 500 mg by mouth every 6 (six) hours as needed (for pain.).   Yes [provider]  amLODipine (NORVASC) 2.5 MG tablet Take 1 tablet (2.5 mg total) by mouth daily. 09/06/16 09/06/17 Yes Riccardo Dubin, MD  aspirin EC 81 MG tablet Take 81 mg by mouth daily.    Yes [provider]  diclofenac sodium (VOLTAREN) 1 % GEL APPLY 2 GRAMS TOPICALLY  4 TIMES A DAY 08/23/16  Yes Riccardo Dubin, MD  metoprolol tartrate (LOPRESSOR) 25 MG tablet TAKE 1 TABLET BY MOUTH TWICE A DAY 05/03/16  Yes Riccardo Dubin, MD  oxyCODONE-acetaminophen (PERCOCET/ROXICET) 5-325 MG tablet Take 1 tablet by mouth every 8 (eight) hours as needed for severe pain. 09/06/16  Yes Nicholas Lose, MD  simvastatin (ZOCOR) 40 MG tablet TAKE 1 TABLET BY MOUTH AT BEDTIME 07/05/16  Yes Riccardo Dubin, MD  ibuprofen (ADVIL,MOTRIN) 800 MG tablet Take 1 tablet (800 mg total) by mouth every 8 (eight) hours as needed. Patient not taking:  Reported on 11/27/2016 08/05/16   Erroll Luna, MD  potassium chloride SA (K-DUR,KLOR-CON) 20 MEQ tablet Take 1 tablet (20 mEq total) by mouth once. Patient not taking: Reported on 11/27/2016 09/30/16 09/30/16  Nicholas Lose, MD    Family History Family History  Problem Relation Age of Onset  . Stroke Maternal Grandmother     Social History Social History  Substance Use Topics  . Smoking status: Former Smoker    Packs/day: 1.50    Years: 4.50    Types: Cigarettes    Quit date: 03/16/1988  . Smokeless tobacco: Never Used  . Alcohol use No     Comment: Quit 1999, h/o DT's and szs. No ETOH since 1999     Allergies   Ace inhibitors and Fosinopril sodium   Review of Systems Review of Systems  Constitutional: Positive for appetite change and fever.  HENT: Negative for congestion.   Respiratory: Positive for cough.   Cardiovascular: Negative for chest pain.  Gastrointestinal: Negative for abdominal pain.  Genitourinary: Negative for flank pain and hematuria.  Musculoskeletal: Negative for back pain.  Neurological: Negative for numbness.  Hematological: Negative for adenopathy.  Psychiatric/Behavioral: Positive for confusion.     Physical Exam Updated Vital Signs BP (!) 103/55   Pulse 87   Temp 99.4 F (37.4 C) (Oral)   Resp 18   Ht 6' (1.829 m)   Wt 79.4 kg (175 lb)   SpO2 96%   BMI 23.73 kg/m   Physical Exam  Constitutional: He appears well-developed.  HENT:  Head: Atraumatic.  Eyes: Pupils are equal, round, and reactive to light.  Neck: Neck supple.  Cardiovascular: Normal rate.   Pulmonary/Chest: Effort normal. He has no wheezes. He exhibits no tenderness.  Abdominal: Soft. He exhibits no distension. There is no tenderness.  Musculoskeletal: He exhibits edema.  Edema to bilateral lower extremities, chronic per patient.  Neurological: He is alert.  Skin: Skin is warm. Capillary refill takes less than 2 seconds.     ED Treatments / Results  Labs (all  labs ordered are listed, but only abnormal results are displayed) Labs Reviewed  COMPREHENSIVE METABOLIC PANEL - Abnormal; Notable for the following:       Result Value   Sodium 134 (*)    Glucose, Bld 154 (*)    Calcium 8.6 (*)    Albumin 2.1 (*)    All other components within normal limits  CBC WITH DIFFERENTIAL/PLATELET - Abnormal; Notable for the following:    WBC 17.5 (*)    RBC 3.38 (*)    Hemoglobin 9.0 (*)    HCT 27.3 (*)    RDW 16.5 (*)    Platelets 522 (*)    Neutro Abs 14.3 (*)    Monocytes Absolute 1.4 (*)    All other components within normal limits  I-STAT CG4 LACTIC ACID, ED - Abnormal; Notable for the following:  Lactic Acid, Venous 2.54 (*)    All other components within normal limits  CULTURE, BLOOD (ROUTINE X 2)  CULTURE, BLOOD (ROUTINE X 2)  URINALYSIS, ROUTINE W REFLEX MICROSCOPIC  I-STAT CG4 LACTIC ACID, ED    EKG  EKG Interpretation None       Radiology Dg Chest Port 1 View  Result Date: 11/27/2016 CLINICAL DATA:  Fever EXAM: PORTABLE CHEST 1 VIEW COMPARISON:  10/13/2016 chest x-ray.  CT 06/24/2016. FINDINGS: Streaky bilateral lung opacities. There is pleural based density in the peripheral left chest, with overlying malignant appearing second and third rib erosions. Patient has history of axillary radiotherapy. Normal heart size and stable aortic tortuosity. Stable biapical pleural thickening. No edema, effusion, or pneumothorax. Emphysema by prior CT. Previous left axillary dissection. IMPRESSION: 1. Streaky opacities at the bases which could be atelectasis or bronchopneumonia. 2. History of squamous cell carcinoma in the left axilla with lateral left second and third rib erosions. Neighboring subpleural density could be from previous radiotherapy or tumor. 3. Emphysema. Electronically Signed   By: Monte Fantasia M.D.   On: 11/27/2016 19:34    Procedures Procedures (including critical care time)  Medications Ordered in ED Medications  sodium  chloride 0.9 % bolus 1,000 mL (0 mLs Intravenous Stopped 11/27/16 2120)  ceFEPIme (MAXIPIME) 2 g in dextrose 5 % 50 mL IVPB (0 g Intravenous Stopped 11/27/16 2050)  vancomycin (VANCOCIN) IVPB 1000 mg/200 mL premix (0 mg Intravenous Stopped 11/27/16 2200)  sodium chloride 0.9 % bolus 1,000 mL (0 mLs Intravenous Stopped 11/27/16 2157)     Initial Impression / Assessment and Plan / ED Course  I have reviewed the triage vital signs and the nursing notes.  Pertinent labs & imaging results that were available during my care of the patient were reviewed by me and considered in my medical decision making (see chart for details).     Patient has recurrent cancer. Somewhat decreased mental status today. Has fever. Has had left lower chest pain with cough. Likely pneumonia as the cause however urinalysis still pending. Initially had some hypotension but has had a initial lactic acid 2.5. Return to normal after some IV fluids and antibiotics. Will admit to hospitalist. Not currently on treatment for his cancer.reviewing records he may not have had inpatient treatment within the last 90 days but initially had been given antibiotics for sepsis and healthcare associated pneumonia.  Patient had not previously been able to urinate. Bladder scan done and had a residual of 500. Foley catheter will be placed.  Final Clinical Impressions(s) / ED Diagnoses   Final diagnoses:  Community acquired pneumonia, unspecified laterality  Urinary retention    New Prescriptions New Prescriptions   No medications on file     Davonna Belling, MD 11/27/16 2251    Davonna Belling, MD 11/27/16 2256

## 2016-11-28 DIAGNOSIS — G9341 Metabolic encephalopathy: Secondary | ICD-10-CM

## 2016-11-28 DIAGNOSIS — R338 Other retention of urine: Secondary | ICD-10-CM

## 2016-11-28 LAB — URINALYSIS, ROUTINE W REFLEX MICROSCOPIC
Bilirubin Urine: NEGATIVE
Glucose, UA: NEGATIVE mg/dL
HGB URINE DIPSTICK: NEGATIVE
Ketones, ur: NEGATIVE mg/dL
Leukocytes, UA: NEGATIVE
NITRITE: NEGATIVE
Protein, ur: NEGATIVE mg/dL
SPECIFIC GRAVITY, URINE: 1.02 (ref 1.005–1.030)
Squamous Epithelial / LPF: NONE SEEN
pH: 6 (ref 5.0–8.0)

## 2016-11-28 LAB — BASIC METABOLIC PANEL
Anion gap: 7 (ref 5–15)
BUN: 9 mg/dL (ref 6–20)
CHLORIDE: 105 mmol/L (ref 101–111)
CO2: 21 mmol/L — AB (ref 22–32)
CREATININE: 0.62 mg/dL (ref 0.61–1.24)
Calcium: 8.4 mg/dL — ABNORMAL LOW (ref 8.9–10.3)
GFR calc Af Amer: >60 mL/min (ref >60)
GFR calc non Af Amer: >60 mL/min (ref >60)
GLUCOSE: 98 mg/dL (ref 65–99)
POTASSIUM: 3.4 mmol/L — AB (ref 3.5–5.1)
Sodium: 133 mmol/L — ABNORMAL LOW (ref 135–145)

## 2016-11-28 LAB — PROTIME-INR
INR: 1.11
Prothrombin Time: 14.3 seconds (ref 11.4–15.2)

## 2016-11-28 LAB — LACTIC ACID, PLASMA
LACTIC ACID, VENOUS: 3 mmol/L — AB (ref 0.5–1.9)
Lactic Acid, Venous: 1.6 mmol/L (ref 0.5–1.9)

## 2016-11-28 LAB — MAGNESIUM: MAGNESIUM: 1.8 mg/dL (ref 1.7–2.4)

## 2016-11-28 LAB — STREP PNEUMONIAE URINARY ANTIGEN: Strep Pneumo Urinary Antigen: NEGATIVE

## 2016-11-28 LAB — BRAIN NATRIURETIC PEPTIDE: B NATRIURETIC PEPTIDE 5: 151.6 pg/mL — AB (ref 0.0–100.0)

## 2016-11-28 LAB — MRSA PCR SCREENING: MRSA by PCR: NEGATIVE

## 2016-11-28 LAB — PROCALCITONIN: PROCALCITONIN: 1.47 ng/mL

## 2016-11-28 MED ORDER — POTASSIUM CHLORIDE CRYS ER 20 MEQ PO TBCR
40.0000 meq | EXTENDED_RELEASE_TABLET | Freq: Once | ORAL | Status: AC
  Administered 2016-11-28: 40 meq via ORAL
  Filled 2016-11-28: qty 2

## 2016-11-28 MED ORDER — MAGNESIUM SULFATE 2 GM/50ML IV SOLN
2.0000 g | Freq: Once | INTRAVENOUS | Status: AC
  Administered 2016-11-28: 2 g via INTRAVENOUS
  Filled 2016-11-28: qty 50

## 2016-11-28 MED ORDER — VANCOMYCIN HCL IN DEXTROSE 1-5 GM/200ML-% IV SOLN
1000.0000 mg | Freq: Two times a day (BID) | INTRAVENOUS | Status: DC
  Administered 2016-11-28 – 2016-11-29 (×3): 1000 mg via INTRAVENOUS
  Filled 2016-11-28 (×3): qty 200

## 2016-11-28 MED ORDER — SODIUM CHLORIDE 0.9 % IV BOLUS (SEPSIS)
1000.0000 mL | Freq: Once | INTRAVENOUS | Status: AC
  Administered 2016-11-28: 1000 mL via INTRAVENOUS

## 2016-11-28 MED ORDER — CEFEPIME HCL 1 G IJ SOLR
1.0000 g | Freq: Three times a day (TID) | INTRAMUSCULAR | Status: DC
  Administered 2016-11-28 – 2016-12-01 (×10): 1 g via INTRAVENOUS
  Filled 2016-11-28 (×14): qty 1

## 2016-11-28 MED ORDER — ENSURE ENLIVE PO LIQD
237.0000 mL | Freq: Two times a day (BID) | ORAL | Status: DC
  Administered 2016-11-29: 237 mL via ORAL

## 2016-11-28 NOTE — Progress Notes (Signed)
Pt had run of vent bigeminy. MD paged. Ordered labs. Will continue to monitor.

## 2016-11-28 NOTE — Progress Notes (Signed)
Pharmacy Antibiotic Note  Timothy Bradford is a 81 y.o. male with altered mental status admitted on 11/27/2016 with pneumonia.  Pharmacy has been consulted for cefepime and vancomycin dosing.  Plan: Cefepime 2 Gm x1 then 1 Gm IV q8h Vancomycin 1 gm IV q12h VT=15-20 mg/L F/u scr/cultures/levels as needed  Height: 6' (182.9 cm) Weight: 175 lb (79.4 kg) IBW/kg (Calculated) : 77.6  Temp (24hrs), Avg:101 F (38.3 C), Min:99.4 F (37.4 C), Max:102.5 F (39.2 C)   Recent Labs Lab 11/27/16 1940 11/27/16 1953 11/27/16 2245  WBC 17.5*  --   --   CREATININE 0.87  --   --   LATICACIDVEN  --  2.54* 1.11    Estimated Creatinine Clearance: 75.6 mL/min (by C-G formula based on SCr of 0.87 mg/dL).    Allergies  Allergen Reactions  . Ace Inhibitors Swelling    ANDIOEDEMA  . Fosinopril Sodium Swelling    ANGIOEDEMA [ACE INHIBITOR]    Antimicrobials this admission: 8/25 cefepime >>  8/25 vancomycin >>   Dose adjustments this admission:   Microbiology results:  BCx:   UCx:    Sputum:    MRSA PCR:   Thank you for allowing pharmacy to be a part of this patient's care.  Dorrene German 11/28/2016 12:03 AM

## 2016-11-28 NOTE — Progress Notes (Signed)
Pt had an 8 beat run of VTACH around 0200. [facility] provider on call notified.

## 2016-11-28 NOTE — Progress Notes (Signed)
PROGRESS NOTE  Timothy Bradford:[id number] DOB: June 02, 1935 DOA: 11/27/2016 PCP: Lars Mage, MD   LOS: 1 day   Brief Narrative / Interim history: Timothy Bradford is a 81 y.o. male with medical history significant of metastasized skin squamous cell cancer (s/p of XRT), seborrheic keratoses, hypertension, hyperlipidemia, dCHF, venous insufficiency changes, who presents with altered mental status, cough and fever.  Chest x-ray was concerning for pneumonia at the bases.  He was admitted for healthcare associated pneumonia  Assessment & Plan: Principal Problem:   HCAP (healthcare-associated pneumonia) Active Problems:   Dyslipidemia   Essential hypertension   Chronic diastolic congestive heart failure (HCC)   Chronic venous insufficiency   Sepsis ([place])   Acute metabolic encephalopathy   Acute urinary retention   Sepsis due to HCAP -Patient meets criteria for sepsis with fever, tachycardia, chest x-ray with evidence of pneumonia -Patient started on broad-spectrum antibiotics with cefepime and azithromycin, continue -Blood cultures obtained in the emergency room, no growth so far -Respiratory panel pending -We will keep in stepdown today  Chronic diastolic CHF -Most recent 2D echo was in 2016 had an EF of 50-55% with grade 1 diastolic dysfunction.  Appears euvolemic right now, will closely monitor fluid status  Acute urinary retention -Possibly due to sepsis/encephalopathy, UA without evidence of infection.  Urinary catheter placed, once clinical picture improves will give patient a voiding trial  Acute encephalopathy -Likely in the setting of #1, seems to be improving per my discussion with patient this morning  Hypertension -Hold amlodipine for now  Hyperlipidemia -Zocor   Metastatic squamous cell carcinoma and left axillary mass -Status post surgical removal as well as radiation in the past, with recurrence, considered currently for treatment.  Followed as an outpatient by Dr.  Lindi Adie   DVT prophylaxis: Lovenox Code Status: Full code Family Communication: No family at bedside Disposition Plan: Remain stepdown    Consultants:   None   Procedures:   None   Antimicrobials:  Cefepime 8/25 >>  Azithromycin 8/25 >>   Subjective: - no chest pain, shortness of breath, no abdominal pain, nausea or vomiting.   Objective: Vitals:   11/28/16 0800 11/28/16 0900 11/28/16 1000 11/28/16 1100  BP: (!) 105/44 (!) 94/46 (!) 116/52 (!) 110/49  Pulse: 93 94 90 74  Resp: 15 (!) 23 20 (!) 27  Temp: (!) 101.1 F (38.4 C)     TempSrc: Oral     SpO2: 94% 94% 94% 94%  Weight:      Height:        Intake/Output Summary (Last 24 hours) at 11/28/16 1136 Last data filed at 11/28/16 1100  Gross per 24 hour  Intake          2533.33 ml  Output              800 ml  Net          1733.33 ml   Filed Weights   11/27/16 1935  Weight: 79.4 kg (175 lb)    Examination:  Vitals:   11/28/16 0800 11/28/16 0900 11/28/16 1000 11/28/16 1100  BP: (!) 105/44 (!) 94/46 (!) 116/52 (!) 110/49  Pulse: 93 94 90 74  Resp: 15 (!) 23 20 (!) 27  Temp: (!) 101.1 F (38.4 C)     TempSrc: Oral     SpO2: 94% 94% 94% 94%  Weight:      Height:        Constitutional: NAD Eyes: lids and conjunctivae normal ENMT: Mucous membranes are moist.  Respiratory: clear to auscultation bilaterally, no wheezing, no crackles. Normal respiratory effort. No accessory muscle use.  Cardiovascular: Regular rate and rhythm, no murmurs / rubs / gallops. No LE edema. 2+ pedal pulses.  Abdomen: no tenderness. Bowel sounds positive.  Skin: no rashes, lesions, ulcers. No induration Neurologic: non focal, equal strength   Data Reviewed: I have independently reviewed following labs and imaging studies   CXR - bibasilar opacities EKG -sinus rhythm   CBC:  Recent Labs Lab 11/27/16 1940  WBC 17.5*  NEUTROABS 14.3*  HGB 9.0*  HCT 27.3*  MCV 80.8  PLT 101*   Basic Metabolic Panel:  Recent  Labs Lab 11/27/16 1940  NA 134*  K 3.7  CL 102  CO2 23  GLUCOSE 154*  BUN 17  CREATININE 0.87  CALCIUM 8.6*   GFR: Estimated Creatinine Clearance: 75.6 mL/min (by C-G formula based on SCr of 0.87 mg/dL). Liver Function Tests:  Recent Labs Lab 11/27/16 1940  AST 32  ALT 23  ALKPHOS 123  BILITOT 0.6  PROT 6.6  ALBUMIN 2.1*   No results for input(s): LIPASE, AMYLASE in the last 168 hours. No results for input(s): AMMONIA in the last 168 hours. Coagulation Profile:  Recent Labs Lab 11/28/16 0154  INR 1.11   Cardiac Enzymes: No results for input(s): CKTOTAL, CKMB, CKMBINDEX, TROPONINI in the last 168 hours. BNP (last 3 results) No results for input(s): PROBNP in the last 8760 hours. HbA1C: No results for input(s): HGBA1C in the last 72 hours. CBG: No results for input(s): GLUCAP in the last 168 hours. Lipid Profile: No results for input(s): CHOL, HDL, LDLCALC, TRIG, CHOLHDL, LDLDIRECT in the last 72 hours. Thyroid Function Tests: No results for input(s): TSH, T4TOTAL, FREET4, T3FREE, THYROIDAB in the last 72 hours. Anemia Panel: No results for input(s): VITAMINB12, FOLATE, FERRITIN, TIBC, IRON, RETICCTPCT in the last 72 hours. Urine analysis:    Component Value Date/Time   COLORURINE AMBER (A) 11/27/2016 2334   APPEARANCEUR CLEAR 11/27/2016 2334   LABSPEC 1.020 11/27/2016 2334   PHURINE 6.0 11/27/2016 2334   GLUCOSEU NEGATIVE 11/27/2016 2334   GLUCOSEU NEG mg/dL 12/27/2008 0118   HGBUR NEGATIVE 11/27/2016 2334   BILIRUBINUR NEGATIVE 11/27/2016 2334   KETONESUR NEGATIVE 11/27/2016 2334   PROTEINUR NEGATIVE 11/27/2016 2334   UROBILINOGEN 0.2 12/27/2008 0118   NITRITE NEGATIVE 11/27/2016 2334   LEUKOCYTESUR NEGATIVE 11/27/2016 2334   Sepsis Labs: Invalid input(s): PROCALCITONIN, LACTICIDVEN  Recent Results (from the past 240 hour(s))  MRSA PCR Screening     Status: None   Collection Time: 11/28/16  1:10 AM  Result Value Ref Range Status   MRSA by PCR  NEGATIVE NEGATIVE Final    Comment:        The GeneXpert MRSA Assay (FDA approved for NASAL specimens only), is one component of a comprehensive MRSA colonization surveillance program. It is not intended to diagnose MRSA infection nor to guide or monitor treatment for MRSA infections.       Radiology Studies: Dg Chest Port 1 View  Result Date: 11/27/2016 CLINICAL DATA:  Fever EXAM: PORTABLE CHEST 1 VIEW COMPARISON:  10/13/2016 chest x-ray.  CT 06/24/2016. FINDINGS: Streaky bilateral lung opacities. There is pleural based density in the peripheral left chest, with overlying malignant appearing second and third rib erosions. Patient has history of axillary radiotherapy. Normal heart size and stable aortic tortuosity. Stable biapical pleural thickening. No edema, effusion, or pneumothorax. Emphysema by prior CT. Previous left axillary dissection. IMPRESSION: 1. Streaky opacities at the  bases which could be atelectasis or bronchopneumonia. 2. History of squamous cell carcinoma in the left axilla with lateral left second and third rib erosions. Neighboring subpleural density could be from previous radiotherapy or tumor. 3. Emphysema. Electronically Signed   By: Monte Fantasia M.D.   On: 11/27/2016 19:34     Scheduled Meds: . aspirin EC  81 mg Oral Daily  . enoxaparin (LOVENOX) injection  40 mg Subcutaneous Q24H  . metoprolol tartrate  25 mg Oral BID  . simvastatin  40 mg Oral QHS   Continuous Infusions: . sodium chloride 100 mL/hr at 11/28/16 0110  . azithromycin Stopped (11/28/16 0220)  . ceFEPime (MAXIPIME) IV Stopped (11/28/16 4103)  . vancomycin 1,000 mg (11/28/16 1007)     Marzetta Board, MD, PhD [facility] Pager [phone]  If 7PM-7AM, please contact night-coverage [email or web address] Password [facility] 11/28/2016, 11:36 AM

## 2016-11-29 DIAGNOSIS — E43 Unspecified severe protein-calorie malnutrition: Secondary | ICD-10-CM | POA: Diagnosis present

## 2016-11-29 LAB — RESPIRATORY PANEL BY PCR
ADENOVIRUS-RVPPCR: NOT DETECTED
Bordetella pertussis: NOT DETECTED
CHLAMYDOPHILA PNEUMONIAE-RVPPCR: NOT DETECTED
CORONAVIRUS 229E-RVPPCR: NOT DETECTED
CORONAVIRUS NL63-RVPPCR: NOT DETECTED
Coronavirus HKU1: NOT DETECTED
Coronavirus OC43: NOT DETECTED
INFLUENZA A-RVPPCR: NOT DETECTED
INFLUENZA B-RVPPCR: NOT DETECTED
MYCOPLASMA PNEUMONIAE-RVPPCR: NOT DETECTED
Metapneumovirus: NOT DETECTED
PARAINFLUENZA VIRUS 4-RVPPCR: NOT DETECTED
Parainfluenza Virus 1: NOT DETECTED
Parainfluenza Virus 2: NOT DETECTED
Parainfluenza Virus 3: NOT DETECTED
Respiratory Syncytial Virus: NOT DETECTED
Rhinovirus / Enterovirus: NOT DETECTED

## 2016-11-29 LAB — COMPREHENSIVE METABOLIC PANEL
ALBUMIN: 1.9 g/dL — AB (ref 3.5–5.0)
ALT: 17 U/L (ref 17–63)
ANION GAP: 8 (ref 5–15)
AST: 22 U/L (ref 15–41)
Alkaline Phosphatase: 88 U/L (ref 38–126)
BILIRUBIN TOTAL: 0.7 mg/dL (ref 0.3–1.2)
BUN: 8 mg/dL (ref 6–20)
CALCIUM: 8.1 mg/dL — AB (ref 8.9–10.3)
CHLORIDE: 107 mmol/L (ref 101–111)
CO2: 20 mmol/L — ABNORMAL LOW (ref 22–32)
Creatinine, Ser: 0.58 mg/dL — ABNORMAL LOW (ref 0.61–1.24)
GFR calc Af Amer: >60 mL/min (ref >60)
Glucose, Bld: 127 mg/dL — ABNORMAL HIGH (ref 65–99)
POTASSIUM: 3.3 mmol/L — AB (ref 3.5–5.1)
Sodium: 135 mmol/L (ref 135–145)
TOTAL PROTEIN: 5.9 g/dL — AB (ref 6.5–8.1)

## 2016-11-29 LAB — CBC
HCT: 25.5 % — ABNORMAL LOW (ref 39.0–52.0)
Hemoglobin: 8.1 g/dL — ABNORMAL LOW (ref 13.0–17.0)
MCH: 25.6 pg — AB (ref 26.0–34.0)
MCHC: 31.8 g/dL (ref 30.0–36.0)
MCV: 80.4 fL (ref 78.0–100.0)
PLATELETS: 465 10*3/uL — AB (ref 150–400)
RBC: 3.17 MIL/uL — AB (ref 4.22–5.81)
RDW: 16.5 % — ABNORMAL HIGH (ref 11.5–15.5)
WBC: 17.3 10*3/uL — ABNORMAL HIGH (ref 4.0–10.5)

## 2016-11-29 LAB — LEGIONELLA PNEUMOPHILA SEROGP 1 UR AG: L. pneumophila Serogp 1 Ur Ag: NEGATIVE

## 2016-11-29 LAB — GLUCOSE, CAPILLARY: GLUCOSE-CAPILLARY: 106 mg/dL — AB (ref 65–99)

## 2016-11-29 MED ORDER — ENSURE ENLIVE PO LIQD
237.0000 mL | Freq: Three times a day (TID) | ORAL | Status: DC
  Administered 2016-11-29 – 2016-12-03 (×9): 237 mL via ORAL

## 2016-11-29 MED ORDER — POTASSIUM CHLORIDE CRYS ER 20 MEQ PO TBCR
40.0000 meq | EXTENDED_RELEASE_TABLET | Freq: Once | ORAL | Status: AC
  Administered 2016-11-29: 40 meq via ORAL
  Filled 2016-11-29: qty 2

## 2016-11-29 MED ORDER — ADULT MULTIVITAMIN W/MINERALS CH
1.0000 | ORAL_TABLET | Freq: Every day | ORAL | Status: DC
  Administered 2016-11-29 – 2016-12-01 (×3): 1 via ORAL
  Filled 2016-11-29 (×3): qty 1

## 2016-11-29 NOTE — Progress Notes (Signed)
Initial Nutrition Assessment  DOCUMENTATION CODES:   Severe malnutrition in context of acute illness/injury  INTERVENTION:  - Will increase Ensure Enlive TID, each supplement provides 350 kcal and 20 grams of protein - Will order daily multivitamin with minerals. - Continue to encourage PO intakes of meals and supplements. - Will order assistance with meal ordering.   NUTRITION DIAGNOSIS:   Malnutrition (severe) related to acute illness, chronic illness, poor appetite, catabolic illness, cancer and cancer related treatments as evidenced by percent weight loss, mild depletion of body fat, moderate depletions of muscle mass, severe depletion of muscle mass.  GOAL:   Patient will meet greater than or equal to 90% of their needs  MONITOR:   PO intake, Supplement acceptance, Weight trends, Labs  REASON FOR ASSESSMENT:   Malnutrition Screening Tool  ASSESSMENT:   81 y.o. male with medical history significant of metastasized skin squamous cell cancer (s/p of XRT), seborrheic keratoses, hypertension, hyperlipidemia, dCHF, venous insufficiency changes, who presents with altered mental status, cough and fever.  Pt seen for MST. BMI indicates normal weight. Weighed pt in bed and used obtained weight of 73.9 kg/162 lbs in estimating nutrition needs. Pt is HOH and also intermittently confused. Family member is at bedside and assists with providing information. No documented intakes since admission but family reports pt ate a small amount of breakfast. Visualized lunch tray with <25% consumed. Pt states he is not feeling hungry. He denies abdominal pain or nausea now or PTA. He was supposed to have his teeth pulled out recently but was unable d/t illness. Soft foods are prepared for him at home and pt states that pain with chewing soft foods is minimal. Despite this, he prefers to consume items that require very minimal or no chewing as chewing requires too much energy. He was drinking 2-3 bottles  of Ensure or Boost PTA. Over the past 3 weeks his appetite has been very poor and he has been refusing to consume more than the amount eaten at lunch today. Family member states pt is unable to order his own food and someone is not always present with him; will order for meal assistance in ordering meals.   Physical assessment shows mild fat wasting to upper arm, moderate muscle wasting to temple and hand, severe muscle wasting to shoulder, clavicle areas; moderate edema to BLE which family reports has been ongoing for "years." She states that at an appointment 3 months ago pt weighed 186 lbs. Current weight of 162 lbs indicates 24 lb weight loss (13% body weight) in 3 months which is significant for time frame.   Medications reviewed; 2 g IV Mg sulfate x1 run yesterday, 40 mEq oral KCl x1 dose yesterday and x1 dose today. Labs reviewed; CBG: 106 mg/dL, K: 3.3 mmol/L, creatinine: 0.58 mg/dL, Ca: 8.1 mg/dL.    Diet Order:  Diet Heart Room service appropriate? Yes; Fluid consistency: Thin  Skin:  Reviewed, no issues  Last BM:  8/27  Height:   Ht Readings from Last 1 Encounters:  11/27/16 6' (1.829 m)    Weight:   Wt Readings from Last 1 Encounters:  11/27/16 175 lb (79.4 kg)    Ideal Body Weight:  80.91 kg  BMI:  Body mass index is 23.73 kg/m.  Estimated Nutritional Needs:   Kcal:  1995-2145 (27-29 kcal/kg)  Protein:  80-90 grams (1-1.2 grams/kg)  Fluid:  >/= 2 L/day  EDUCATION NEEDS:   No education needs identified at this time    Jarome Matin, MS,  RD, LDN, CNSC Inpatient Clinical Dietitian Pager # [phone] After hours/weekend pager # [phone]

## 2016-11-29 NOTE — Care Management Note (Signed)
Case Management Note  Patient Details  Name: Kinsler Soeder MRN: [id number] Date of Birth: 1935-04-17  Subjective/Objective:                  sepsis  Action/Plan: Date:  November 29, 2016 Chart reviewed for concurrent status and case management needs. Will continue to follow patient progress. Discharge Planning: following for needs Expected discharge date: [id number] Velva Harman, BSN, [place], [place]  Expected Discharge Date:                  Expected Discharge Plan:  Home/Self Care  In-House Referral:     Discharge planning Services     Post Acute Care Choice:    Choice offered to:     DME Arranged:    DME Agency:     HH Arranged:    [place] Agency:     Status of Service:  In process, will continue to follow  If discussed at Long Length of Stay Meetings, dates discussed:    Additional Comments:  Leeroy Cha, RN 11/29/2016, 8:51 AM

## 2016-11-29 NOTE — Progress Notes (Addendum)
PROGRESS NOTE  Timothy Bradford JQB:[id number] DOB: 04-23-1935 DOA: 11/27/2016 PCP: Lars Mage, MD   LOS: 2 days   Brief Narrative / Interim history: Timothy Bradford is a 81 y.o. male with medical history significant of metastasized skin squamous cell cancer (s/p of XRT), seborrheic keratoses, hypertension, hyperlipidemia, dCHF, venous insufficiency changes, who presents with altered mental status, cough and fever.  Chest x-ray was concerning for pneumonia at the bases.  He was admitted for healthcare associated pneumonia  8/27 -improving, transfer out of stepdown  Assessment & Plan: Principal Problem:   HCAP (healthcare-associated pneumonia) Active Problems:   Dyslipidemia   Essential hypertension   Chronic diastolic congestive heart failure (HCC)   Chronic venous insufficiency   Sepsis ([place])   Acute metabolic encephalopathy   Acute urinary retention   Sepsis due to HCAP -Patient meets criteria for sepsis with fever, tachycardia, chest x-ray with evidence of pneumonia -Patient started on broad-spectrum antibiotics with cefepime and azithromycin, continue.  Discontinue vancomycin -Blood cultures obtained in the emergency room, still without growth today -Respiratory panel still pending today -Improving, transfer to the floor  Chronic diastolic CHF -Most recent 2D echo was in 2016 had an EF of 50-55% with grade 1 diastolic dysfunction.  Appears euvolemic right now, will closely monitor fluid status -Patient improving, sepsis physiology resolved, he is alert, eating, discontinue IV fluids today  Acute urinary retention -Possibly due to sepsis/encephalopathy, UA without evidence of infection. -Voiding trial within 24 hours  Acute encephalopathy -Likely in the setting of #1, seems to be improving per my discussion with patient this morning -discussed at length with patient's wife at bedside today, he has been having intermittent memory problems for several weeks if not months.   Suspect early underlying dementia  Hypertension -Hold amlodipine for now  Hyperlipidemia -Zocor   Metastatic squamous cell carcinoma and left axillary mass -Status post surgical removal as well as radiation in the past, with recurrence, considered currently for treatment.  Followed as an outpatient by Dr. Lindi Adie   DVT prophylaxis: Lovenox Code Status: Full code Family Communication: Discussed with patient's sister and wife at bedside Disposition Plan: Transfer to telemetry   Consultants:   None   Procedures:   None   Antimicrobials:  Cefepime 8/25 >>  Azithromycin 8/25 >>   Vancomycin 8/25 >> 8/27  Subjective: -Feels well this morning, no chest pain, shortness of breath.  Objective: Vitals:   11/29/16 0800 11/29/16 0900 11/29/16 1000 11/29/16 1026  BP: 121/67 119/67 126/76 126/76  Pulse: 98 98 100   Resp: (!) 25 (!) 23 19   Temp: 98.8 F (37.1 C)     TempSrc: Oral     SpO2: 98% 100% 99%   Weight:      Height:        Intake/Output Summary (Last 24 hours) at 11/29/16 1056 Last data filed at 11/29/16 1026  Gross per 24 hour  Intake          3465.83 ml  Output             2225 ml  Net          1240.83 ml   Filed Weights   11/27/16 1935  Weight: 79.4 kg (175 lb)    Examination:  Vitals:   11/29/16 0800 11/29/16 0900 11/29/16 1000 11/29/16 1026  BP: 121/67 119/67 126/76 126/76  Pulse: 98 98 100   Resp: (!) 25 (!) 23 19   Temp: 98.8 F (37.1 C)     TempSrc: Oral  SpO2: 98% 100% 99%   Weight:      Height:        Constitutional: NAD Eyes: No scleral icterus Respiratory: Clear to auscultation bilaterally without wheezing or crackles.  Normal respiratory effort Cardiovascular: Regular rate, no murmurs heard.  No edema.  2+ pedal pulses Abdomen: Soft, nontender to palpation.  Positive bowel sounds Skin: No rashes Neurologic: No focal findings   Data Reviewed: I have independently reviewed following labs and imaging studies      CBC:  Recent Labs Lab 11/27/16 1940 11/29/16 0309  WBC 17.5* 17.3*  NEUTROABS 14.3*  --   HGB 9.0* 8.1*  HCT 27.3* 25.5*  MCV 80.8 80.4  PLT 522* 891*   Basic Metabolic Panel:  Recent Labs Lab 11/27/16 1940 11/28/16 1358 11/29/16 0309  NA 134* 133* 135  K 3.7 3.4* 3.3*  CL 102 105 107  CO2 23 21* 20*  GLUCOSE 154* 98 127*  BUN 17 9 8   CREATININE 0.87 0.62 0.58*  CALCIUM 8.6* 8.4* 8.1*  MG  --  1.8  --    GFR: Estimated Creatinine Clearance: 82.2 mL/min (A) (by C-G formula based on SCr of 0.58 mg/dL (L)). Liver Function Tests:  Recent Labs Lab 11/27/16 1940 11/29/16 0309  AST 32 22  ALT 23 17  ALKPHOS 123 88  BILITOT 0.6 0.7  PROT 6.6 5.9*  ALBUMIN 2.1* 1.9*   No results for input(s): LIPASE, AMYLASE in the last 168 hours. No results for input(s): AMMONIA in the last 168 hours. Coagulation Profile:  Recent Labs Lab 11/28/16 0154  INR 1.11   Cardiac Enzymes: No results for input(s): CKTOTAL, CKMB, CKMBINDEX, TROPONINI in the last 168 hours. BNP (last 3 results) No results for input(s): PROBNP in the last 8760 hours. HbA1C: No results for input(s): HGBA1C in the last 72 hours. CBG:  Recent Labs Lab 11/29/16 0738  GLUCAP 106*   Lipid Profile: No results for input(s): CHOL, HDL, LDLCALC, TRIG, CHOLHDL, LDLDIRECT in the last 72 hours. Thyroid Function Tests: No results for input(s): TSH, T4TOTAL, FREET4, T3FREE, THYROIDAB in the last 72 hours. Anemia Panel: No results for input(s): VITAMINB12, FOLATE, FERRITIN, TIBC, IRON, RETICCTPCT in the last 72 hours. Urine analysis:    Component Value Date/Time   COLORURINE AMBER (A) 11/27/2016 2334   APPEARANCEUR CLEAR 11/27/2016 2334   LABSPEC 1.020 11/27/2016 2334   PHURINE 6.0 11/27/2016 2334   GLUCOSEU NEGATIVE 11/27/2016 2334   GLUCOSEU NEG mg/dL 12/27/2008 0118   HGBUR NEGATIVE 11/27/2016 2334   BILIRUBINUR NEGATIVE 11/27/2016 2334   KETONESUR NEGATIVE 11/27/2016 2334   PROTEINUR  NEGATIVE 11/27/2016 2334   UROBILINOGEN 0.2 12/27/2008 0118   NITRITE NEGATIVE 11/27/2016 2334   LEUKOCYTESUR NEGATIVE 11/27/2016 2334   Sepsis Labs: Invalid input(s): PROCALCITONIN, LACTICIDVEN  Recent Results (from the past 240 hour(s))  MRSA PCR Screening     Status: None   Collection Time: 11/28/16  1:10 AM  Result Value Ref Range Status   MRSA by PCR NEGATIVE NEGATIVE Final    Comment:        The GeneXpert MRSA Assay (FDA approved for NASAL specimens only), is one component of a comprehensive MRSA colonization surveillance program. It is not intended to diagnose MRSA infection nor to guide or monitor treatment for MRSA infections.       Radiology Studies: Dg Chest Port 1 View  Result Date: 11/27/2016 CLINICAL DATA:  Fever EXAM: PORTABLE CHEST 1 VIEW COMPARISON:  10/13/2016 chest x-ray.  CT 06/24/2016. FINDINGS: Streaky bilateral  lung opacities. There is pleural based density in the peripheral left chest, with overlying malignant appearing second and third rib erosions. Patient has history of axillary radiotherapy. Normal heart size and stable aortic tortuosity. Stable biapical pleural thickening. No edema, effusion, or pneumothorax. Emphysema by prior CT. Previous left axillary dissection. IMPRESSION: 1. Streaky opacities at the bases which could be atelectasis or bronchopneumonia. 2. History of squamous cell carcinoma in the left axilla with lateral left second and third rib erosions. Neighboring subpleural density could be from previous radiotherapy or tumor. 3. Emphysema. Electronically Signed   By: Monte Fantasia M.D.   On: 11/27/2016 19:34     Scheduled Meds: . aspirin EC  81 mg Oral Daily  . enoxaparin (LOVENOX) injection  40 mg Subcutaneous Q24H  . feeding supplement (ENSURE ENLIVE)  237 mL Oral BID BM  . metoprolol tartrate  25 mg Oral BID  . simvastatin  40 mg Oral QHS   Continuous Infusions: . azithromycin Stopped (11/28/16 2300)  . ceFEPime (MAXIPIME) IV 1  g (11/29/16 0520)  . vancomycin Stopped (11/29/16 1126)     Marzetta Board, MD, PhD [facility] Pager [phone]  If 7PM-7AM, please contact night-coverage [email or web address] Password [facility] 11/29/2016, 10:56 AM

## 2016-11-29 NOTE — Progress Notes (Signed)
Patient shaking at change of shift. RN asked if patient was cold and he replied yes. Patient also appeared tachycardic and tachypneic on the monitor. RN took patient's temp. Temp 100.0. RN gave patient scheduled Lopressor and PRN Tylenol. EKG showed Sinus Tach (in chart).  Will continue to monitor patient.

## 2016-11-29 NOTE — Evaluation (Signed)
Physical Therapy Evaluation Patient Details Name: Timothy Bradford MRN: [id number] DOB: 1935-08-22 Today's Date: 11/29/2016   History of Present Illness  81 y.o. male with medical history significant of metastasized skin squamous cell cancer (s/p of XRT), seborrheic keratoses, hypertension, hyperlipidemia, dCHF, venous insufficiency changes, (wife reports h/o 2 surgeries to remove cancer from LUE) who presents with altered mental status, cough and fever.  Chest x-ray was concerning for pneumonia at the bases.  He was admitted for healthcare associated pneumonia  Clinical Impression  Pt admitted with above diagnosis. Pt currently with functional limitations due to the deficits listed below (see PT Problem List). Mod A for bed mobility and transfers. Pt has decreased safety awareness, he transferred from bedside commode to recliner independently despite having multiple lines/tubes and being instructed to call for assistance. LUE is quite painful and swollen (wife reports EMS placed IV in this arm), that he wears compression sleeve and glove but they are now too loose. RN notified and will notify MD.  Pt will benefit from skilled PT to increase their independence and safety with mobility to allow discharge to the venue listed below.       Follow Up Recommendations SNF    Equipment Recommendations  Cane (small based quad cane)    Recommendations for Other Services       Precautions / Restrictions Precautions Precautions: Fall Precaution Comments: painful, non functional LUE Restrictions Weight Bearing Restrictions: No      Mobility  Bed Mobility Overal bed mobility: Needs Assistance Bed Mobility: Supine to Sit     Supine to sit: Mod assist     General bed mobility comments: assist to advance BLEs and raise trunk  Transfers Overall transfer level: Needs assistance Equipment used: 2 person hand held assist Transfers: Sit to/from Stand;Stand Pivot Transfers Sit to Stand: Mod  assist Stand pivot transfers: Min assist       General transfer comment: assist to rise/steady, VCs hand placement; pt incontinent of bowel, stood for ~60 sec for pericare after using BSC. (pt had independently transferred [facility] to recliner despite being instructed to call on call bell for assistance)  Ambulation/Gait                Stairs            Wheelchair Mobility    Modified Rankin (Stroke Patients Only)       Balance Overall balance assessment: Needs assistance   Sitting balance-Leahy Scale: Fair       Standing balance-Leahy Scale: Poor                               Pertinent Vitals/Pain Pain Assessment: 0-10 Pain Score: 9  Pain Location: LUE, edema noted, RN notified Pain Descriptors / Indicators: Sore Pain Intervention(s): Limited activity within patient's tolerance;Monitored during session;RN gave pain meds during session;Repositioned    Home Living Family/patient expects to be discharged to:: Private residence Living Arrangements: Spouse/significant other Available Help at Discharge: Available 24 hours/day   Home Access: Stairs to enter   [employer] of Steps: 1 Home Layout: One level Home Equipment: Shower seat      Prior Function Level of Independence: Independent         Comments: walked short distances only     Hand Dominance        Extremity/Trunk Assessment   Upper Extremity Assessment Upper Extremity Assessment: LUE deficits/detail LUE Deficits / Details: h/o pain and inability to  use LUE since 2 surgeries for removal of cancer per wife, wears compression sleeve (but they no longer fit per wife)    Lower Extremity Assessment Lower Extremity Assessment: Generalized weakness (B knee ext -10* AROM)    Cervical / Trunk Assessment Cervical / Trunk Assessment: Kyphotic  Communication   Communication: No difficulties  Cognition Arousal/Alertness: Awake/alert Behavior During Therapy: WFL for tasks  assessed/performed Overall Cognitive Status: Impaired/Different from baseline Area of Impairment: Memory;Safety/judgement                     Memory: Decreased short-term memory   Safety/Judgement: Decreased awareness of deficits     General Comments: wife reports pt has short term memory loss at baseline but notes this is now worse; during PT eval, pt transferred [facility] to recliner independently after being instructed to use call bell to call for assistance transfering when finished using [facility]      General Comments      Exercises     Assessment/Plan    PT Assessment Patient needs continued PT services  PT Problem List Decreased strength;Decreased activity tolerance;Decreased balance;Decreased mobility;Decreased cognition;Pain       PT Treatment Interventions Gait training;DME instruction;Therapeutic activities;Therapeutic exercise;Balance training;Functional mobility training    PT Goals (Current goals can be found in the Care Plan section)  Acute Rehab PT Goals Patient Stated Goal: to get stronger PT Goal Formulation: With patient/family Time For Goal Achievement: 12/13/16 Potential to Achieve Goals: Fair    Frequency Min 3X/week   Barriers to discharge        Co-evaluation               AM-PAC PT "6 Clicks" Daily Activity  Outcome Measure Difficulty turning over in bed (including adjusting bedclothes, sheets and blankets)?: Unable Difficulty moving from lying on back to sitting on the side of the bed? : Unable Difficulty sitting down on and standing up from a chair with arms (e.g., wheelchair, bedside commode, etc,.)?: Unable Help needed moving to and from a bed to chair (including a wheelchair)?: Total Help needed walking in hospital room?: Total Help needed climbing 3-5 steps with a railing? : Total 6 Click Score: 6    End of Session Equipment Utilized During Treatment: Gait belt Activity Tolerance: Patient limited by fatigue;Patient limited by  pain   Nurse Communication: Mobility status PT Visit Diagnosis: Unsteadiness on feet (R26.81);Pain Pain - Right/Left: Left Pain - part of body: Arm    Time: 1321-1410 PT Time Calculation (min) (ACUTE ONLY): 49 min   Charges:   PT Evaluation $PT Eval Moderate Complexity: 1 Mod PT Treatments $Therapeutic Activity: 23-37 mins   PT G Codes:          Philomena Doheny 11/29/2016, 2:27 PM [phone]

## 2016-11-30 DIAGNOSIS — E43 Unspecified severe protein-calorie malnutrition: Secondary | ICD-10-CM

## 2016-11-30 LAB — CBC
HEMATOCRIT: 26.8 % — AB (ref 39.0–52.0)
Hemoglobin: 8.7 g/dL — ABNORMAL LOW (ref 13.0–17.0)
MCH: 26.1 pg (ref 26.0–34.0)
MCHC: 32.5 g/dL (ref 30.0–36.0)
MCV: 80.5 fL (ref 78.0–100.0)
Platelets: 476 10*3/uL — ABNORMAL HIGH (ref 150–400)
RBC: 3.33 MIL/uL — AB (ref 4.22–5.81)
RDW: 16.5 % — ABNORMAL HIGH (ref 11.5–15.5)
WBC: 23.3 10*3/uL — AB (ref 4.0–10.5)

## 2016-11-30 LAB — BASIC METABOLIC PANEL
Anion gap: 7 (ref 5–15)
BUN: 11 mg/dL (ref 6–20)
CHLORIDE: 105 mmol/L (ref 101–111)
CO2: 23 mmol/L (ref 22–32)
Calcium: 8.7 mg/dL — ABNORMAL LOW (ref 8.9–10.3)
Creatinine, Ser: 0.65 mg/dL (ref 0.61–1.24)
GFR calc non Af Amer: >60 mL/min (ref >60)
Glucose, Bld: 133 mg/dL — ABNORMAL HIGH (ref 65–99)
POTASSIUM: 3.8 mmol/L (ref 3.5–5.1)
SODIUM: 135 mmol/L (ref 135–145)

## 2016-11-30 NOTE — Clinical Social Work Note (Signed)
Clinical Social Work Assessment  Patient Details  Name: Timothy Bradford MRN: [id number] Date of Birth: 1935-04-25  Date of referral:  11/30/16               Reason for consult:  Facility Placement                Permission sought to share information with:  Family Supports Permission granted to share information::  Yes, Verbal Permission Granted  Name::     Wife Arline, Daughter Pearly  Agency::     Relationship::     Contact Information:     Housing/Transportation Living arrangements for the past 2 months:  Single Family Home Source of Information:  Patient, Spouse Patient Interpreter Needed:  None Criminal Activity/Legal Involvement Pertinent to Current Situation/Hospitalization:  No - Comment as needed Significant Relationships:  Adult Children, Other Family Members, Spouse Lives with:  Spouse Do you feel safe going back to the place where you live?  Yes Need for family participation in patient care:  No (Coment)  Care giving concerns:  Pt from home where he resides with his wife. Daughter nearby and highly supportive. No caregiving concerns, reports is independent with ambulation and ADLs at baseline.    Social Worker assessment / plan:  CSW consulted for assistance with SNF placement- [facility]. Met with pt and wife at bedside. Both aware rehab has been recommended and are open to pursuing SNF. Obtained PASSR, completed FL2 and made referrals. Will follow up with bed offers. Provided wife with SNF list as she states they have no facility preference.   Plan: SNF at [place] for [place] [facility]- referrals pending.   Employment status:  Retired Nurse, adult PT Recommendations:  [address] [place] / Referral to community resources:  [place]  Patient/Family's Response to care:  Appreciative of care.  Patient/Family's Understanding of and Emotional Response to Diagnosis, Current Treatment, and Prognosis:  Demonstrate adequate  understanding of plan, emotionally appropriate and positive  Emotional Assessment Appearance:  Appears stated age Attitude/Demeanor/Rapport:   (pleasant, drowsy) Affect (typically observed):  Accepting, Calm Orientation:  Oriented to Self, Oriented to Place, Oriented to Situation Alcohol / Substance use:  Not Applicable Psych involvement (Current and /or in the community):  No (Comment)  Discharge Needs  Concerns to be addressed:  Discharge Planning Concerns Readmission within the last 30 days:  No Current discharge risk:  None Barriers to Discharge:  Continued Medical Work up   [employer], LCSW 11/30/2016, 2:13 PM  [phone]

## 2016-11-30 NOTE — NC FL2 (Signed)
[place] LEVEL OF CARE SCREENING TOOL     IDENTIFICATION  Patient Name: Timothy Bradford Birthdate: 11/21/1935 Sex: male Admission Date (Current Location): 11/27/2016  [facility] and [place] Number:  Herbalist and Address:  [facility],  [place] [address], [place]      Provider Number: [id number]  Attending Physician Name and Address:  Caren Griffins, MD  Relative Name and Phone Number:       Current Level of Care: Hospital Recommended Level of Care: [place] Prior Approval Number:    Date Approved/Denied:   PASRR Number: [id number]  Discharge Plan: SNF    Current Diagnoses: Patient Active Problem List   Diagnosis Date Noted  . Protein-calorie malnutrition, severe 11/29/2016  . Sepsis ([place]) 11/27/2016  . HCAP (healthcare-associated pneumonia) 11/27/2016  . Acute metabolic encephalopathy 64/33/2951  . Acute urinary retention 11/27/2016  . Left shoulder pain 05/28/2016  . Need for immunization against influenza 02/25/2016  . Advance care planning 02/23/2016  . Metastatic squamous cell carcinoma ([place]) 06/20/2015  . Encounter for immunization 12/27/2014  . Chronic venous insufficiency   . H/O hyperglycemia 04/10/2014  . Gout 07/25/2013  . Swelling of joint, wrist, left 07/13/2013  . Seborrheic keratoses, inflamed 10/26/2012  . Health care maintenance 10/26/2012  . Chronic diastolic congestive heart failure ([place]) 12/26/2008  . OSTEOARTHRITIS 05/27/2006  . Dyslipidemia 04/26/2006  . ERECTILE DYSFUNCTION 02/09/2006  . Essential hypertension 02/09/2006    Orientation RESPIRATION BLADDER Height & Weight     Self, Time, Situation, Place  O2 (2L) Indwelling catheter Weight: 175 lb (79.4 kg) Height:  6' (182.9 cm)  BEHAVIORAL SYMPTOMS/MOOD NEUROLOGICAL BOWEL NUTRITION STATUS      Continent Diet (heart healthy)  AMBULATORY STATUS COMMUNICATION OF NEEDS Skin   Extensive Assist Verbally Normal                     Personal Care Assistance Level of Assistance  Bathing, Feeding, Dressing Bathing Assistance: Limited assistance Feeding assistance: Independent Dressing Assistance: Limited assistance     Functional Limitations Info  Sight, Hearing, Speech Sight Info: Adequate Hearing Info: Adequate Speech Info: Adequate    SPECIAL CARE FACTORS FREQUENCY  PT (By licensed PT), OT (By licensed OT)     PT Frequency: 5x OT Frequency: 5x            Contractures Contractures Info: Not present    Additional Factors Info  Code Status, Allergies, Isolation Precautions Code Status Info: full code Allergies Info: Ace Inhibitors, Fosinopril Sodium     Isolation Precautions Info: droplet precautions     Current Medications (11/30/2016):  This is the current hospital active medication list Current Facility-Administered Medications  Medication Dose Route Frequency Provider Last Rate Last Dose  . acetaminophen (TYLENOL) tablet 500 mg  500 mg Oral Q6H PRN Ivor Costa, MD   500 mg at 11/29/16 2014  . albuterol (PROVENTIL) (2.5 MG/3ML) 0.083% nebulizer solution 2.5 mg  2.5 mg Nebulization Q4H PRN Ivor Costa, MD      . aspirin EC tablet 81 mg  81 mg Oral Daily Ivor Costa, MD   81 mg at 11/30/16 8841  . azithromycin (ZITHROMAX) 500 mg in dextrose 5 % 250 mL IVPB  500 mg Intravenous QHS Ivor Costa, MD   Stopped at 11/29/16 2211  . ceFEPIme (MAXIPIME) 1 g in dextrose 5 % 50 mL IVPB  1 g Intravenous Q8H Dorrene German, [place]   Stopped at 11/30/16 1438  .  dextromethorphan-guaiFENesin (MUCINEX DM) 30-600 MG per 12 hr tablet 1 tablet  1 tablet Oral BID PRN Ivor Costa, MD      . enoxaparin (LOVENOX) injection 40 mg  40 mg Subcutaneous Q24H Ivor Costa, MD   40 mg at 11/30/16 8341  . feeding supplement (ENSURE ENLIVE) (ENSURE ENLIVE) liquid 237 mL  237 mL Oral TID BM Caren Griffins, MD   237 mL at 11/30/16 1400  . hydrALAZINE (APRESOLINE) injection 5 mg  5 mg Intravenous Q2H PRN Ivor Costa, MD      .  metoprolol tartrate (LOPRESSOR) tablet 25 mg  25 mg Oral BID Ivor Costa, MD   25 mg at 11/30/16 9622  . multivitamin with minerals tablet 1 tablet  1 tablet Oral Daily Caren Griffins, MD   1 tablet at 11/30/16 [phone]  . ondansetron (ZOFRAN) injection 4 mg  4 mg Intravenous Q8H PRN Ivor Costa, MD      . oxyCODONE-acetaminophen (PERCOCET/ROXICET) 5-325 MG per tablet 1 tablet  1 tablet Oral Q8H PRN Ivor Costa, MD   1 tablet at 11/29/16 1336  . simvastatin (ZOCOR) tablet 40 mg  40 mg Oral QHS Ivor Costa, MD   40 mg at 11/29/16 2032  . zolpidem (AMBIEN) tablet 5 mg  5 mg Oral QHS PRN Ivor Costa, MD         Discharge Medications: Please see discharge summary for a list of discharge medications.  Relevant Imaging Results:  Relevant Lab Results:   Additional Information SS# [id number]  Nila Nephew, LCSW

## 2016-11-30 NOTE — Progress Notes (Signed)
Pharmacy Antibiotic Note  Timothy Bradford is a 81 y.o. male presented to the ED on 11/27/2016 with AMS and fever.  Vancomycin, cefepime and azithromycin started on admission for sepsis/PNA.  MRSA PCR neg with vancomycin d/ced on 8/27.  Today, 11/30/2016: - day #3 abx -  Tmax 100, WBC up 23.3 -  SCr stable (crcl~82) - all cultures have been negative thus far   Plan: - continue cefepime 1 gm IV q8h and azithromycin 500 mg q24h - f/u renal function and culture results  ____________________________  Height: 6' (182.9 cm) Weight: 175 lb (79.4 kg) IBW/kg (Calculated) : 77.6  Temp (24hrs), Avg:98.9 F (37.2 C), Min:97.6 F (36.4 C), Max:100 F (37.8 C)   Recent Labs Lab 11/27/16 1940 11/27/16 1953 11/27/16 2245 11/28/16 0154 11/28/16 1358 11/29/16 0309 11/30/16 0324  WBC 17.5*  --   --   --   --  17.3* 23.3*  CREATININE 0.87  --   --   --  0.62 0.58* 0.65  LATICACIDVEN  --  2.54* 1.11 3.0* 1.6  --   --     Estimated Creatinine Clearance: 82.2 mL/min (by C-G formula based on SCr of 0.65 mg/dL).    Allergies  Allergen Reactions  . Ace Inhibitors Swelling    ANDIOEDEMA  . Fosinopril Sodium Swelling    ANGIOEDEMA [ACE INHIBITOR]    Antimicrobials: 8/25 cefepime >>  8/25 vancomycin >> 8/27 8/26 zmax >>  Dose adjustments this admission: --  Microbiology results: 8/25 BCx x2:  8/26 Respiratory panel PCR: neg FINAL 8/26 MRSA PCR: negative  Thank you for allowing pharmacy to be a part of this patient's care.  Lynelle Doctor 11/30/2016 11:19 AM

## 2016-11-30 NOTE — Progress Notes (Addendum)
PROGRESS NOTE  Gar Glance QIH:[id number] DOB: 1935-06-20 DOA: 11/27/2016 PCP: Lars Mage, MD   LOS: 3 days   Brief Narrative / Interim history: Timothy Bradford is a 81 y.o. male with medical history significant of metastasized skin squamous cell cancer (s/p of XRT), seborrheic keratoses, hypertension, hyperlipidemia, dCHF, venous insufficiency changes, who presents with altered mental status, cough and fever.  Chest x-ray was concerning for pneumonia at the bases.  He was admitted for healthcare associated pneumonia  8/27 -improving, transfer out of stepdown.  8/28 -Education officer, museum consulted as PT recommending SNF  Assessment & Plan: Principal Problem:   HCAP (healthcare-associated pneumonia) Active Problems:   Dyslipidemia   Essential hypertension   Chronic diastolic congestive heart failure (HCC)   Chronic venous insufficiency   Sepsis ([place])   Acute metabolic encephalopathy   Acute urinary retention   Protein-calorie malnutrition, severe   Sepsis due to HCAP -Patient meets criteria for sepsis with fever, tachycardia, chest x-ray with evidence of pneumonia -Patient started on broad-spectrum antibiotics with cefepime and azithromycin, continue.  Discontinue vancomycin -Blood cultures obtained in the emergency room, still without growth today -Respiratory panel negative -Transfer to floor, waiting on bed -WBC increasing today, no diarrhea/no new symptoms. Closely monitor  Chronic diastolic CHF -Most recent 2D echo was in 2016 had an EF of 50-55% with grade 1 diastolic dysfunction.  Appears euvolemic right now, will closely monitor fluid status -Patient improving, sepsis physiology resolved, he is alert, eating, discontinued IV fluids  Acute urinary retention -Possibly due to sepsis/encephalopathy, UA without evidence of infection. -We will give a voiding trial today  Acute encephalopathy -Likely in the setting of #1, seems to be improving per my discussion with patient this  morning -discussed at length with patient's wife at bedside 8/27, he has been having intermittent memory problems for several weeks if not months.  Suspect early underlying dementia  Hypertension -Hold amlodipine for now.  Had some low reading into the 87F systolic overnight, discussed with RN, unlikely to be accurate.  Blood pressure in the 130s 140s this morning  Hyperlipidemia -Zocor   Metastatic squamous cell carcinoma and left axillary mass -Status post surgical removal as well as radiation in the past, with recurrence, considered currently for treatment.  Followed as an outpatient by Dr. Lindi Adie   DVT prophylaxis: Lovenox Code Status: Full code Family Communication: Discussed with patient's sister and wife at bedside 8/27 Disposition Plan: Transfer to telemetry   Consultants:   None   Procedures:   None   Antimicrobials:  Cefepime 8/25 >>  Azithromycin 8/25 >>   Vancomycin 8/25 >> 8/27  Subjective: -Feels well this morning, no chest pain, shortness of breath.  Objective: Vitals:   11/30/16 0500 11/30/16 0600 11/30/16 0700 11/30/16 0800  BP:  139/71 134/62   Pulse: 77 76 96   Resp: 20 18 (!) 24   Temp:    99.1 F (37.3 C)  TempSrc:    Oral  SpO2: 99% 100% (!) 86%   Weight:      Height:        Intake/Output Summary (Last 24 hours) at 11/30/16 1121 Last data filed at 11/30/16 0725  Gross per 24 hour  Intake              400 ml  Output             2550 ml  Net            -2150 ml   [employer]  11/27/16 1935  Weight: 79.4 kg (175 lb)    Examination:  Vitals:   11/30/16 0500 11/30/16 0600 11/30/16 0700 11/30/16 0800  BP:  139/71 134/62   Pulse: 77 76 96   Resp: 20 18 (!) 24   Temp:    99.1 F (37.3 C)  TempSrc:    Oral  SpO2: 99% 100% (!) 86%   Weight:      Height:        Constitutional: NAD Eyes: No icterus, lids and conjunctivae normal Respiratory: Clear to auscultation, no wheezing, no crackles. Cardiovascular: Regular rate and  rhythm, no murmurs.  No edema. Abdomen: Soft, nontender to palpation.  Bowel sounds positive. Skin: No rashes Neurologic: Nonfocal, generally weak but equal   Data Reviewed: I have independently reviewed following labs and imaging studies     CBC:  Recent Labs Lab 11/27/16 1940 11/29/16 0309 11/30/16 0324  WBC 17.5* 17.3* 23.3*  NEUTROABS 14.3*  --   --   HGB 9.0* 8.1* 8.7*  HCT 27.3* 25.5* 26.8*  MCV 80.8 80.4 80.5  PLT 522* 465* 387*   Basic Metabolic Panel:  Recent Labs Lab 11/27/16 1940 11/28/16 1358 11/29/16 0309 11/30/16 0324  NA 134* 133* 135 135  K 3.7 3.4* 3.3* 3.8  CL 102 105 107 105  CO2 23 21* 20* 23  GLUCOSE 154* 98 127* 133*  BUN 17 9 8 11   CREATININE 0.87 0.62 0.58* 0.65  CALCIUM 8.6* 8.4* 8.1* 8.7*  MG  --  1.8  --   --    GFR: Estimated Creatinine Clearance: 82.2 mL/min (by C-G formula based on SCr of 0.65 mg/dL). Liver Function Tests:  Recent Labs Lab 11/27/16 1940 11/29/16 0309  AST 32 22  ALT 23 17  ALKPHOS 123 88  BILITOT 0.6 0.7  PROT 6.6 5.9*  ALBUMIN 2.1* 1.9*   No results for input(s): LIPASE, AMYLASE in the last 168 hours. No results for input(s): AMMONIA in the last 168 hours. Coagulation Profile:  Recent Labs Lab 11/28/16 0154  INR 1.11   Cardiac Enzymes: No results for input(s): CKTOTAL, CKMB, CKMBINDEX, TROPONINI in the last 168 hours. BNP (last 3 results) No results for input(s): PROBNP in the last 8760 hours. HbA1C: No results for input(s): HGBA1C in the last 72 hours. CBG:  Recent Labs Lab 11/29/16 0738  GLUCAP 106*   Lipid Profile: No results for input(s): CHOL, HDL, LDLCALC, TRIG, CHOLHDL, LDLDIRECT in the last 72 hours. Thyroid Function Tests: No results for input(s): TSH, T4TOTAL, FREET4, T3FREE, THYROIDAB in the last 72 hours. Anemia Panel: No results for input(s): VITAMINB12, FOLATE, FERRITIN, TIBC, IRON, RETICCTPCT in the last 72 hours. Urine analysis:    Component Value Date/Time    COLORURINE AMBER (A) 11/27/2016 2334   APPEARANCEUR CLEAR 11/27/2016 2334   LABSPEC 1.020 11/27/2016 2334   PHURINE 6.0 11/27/2016 2334   GLUCOSEU NEGATIVE 11/27/2016 2334   GLUCOSEU NEG mg/dL 12/27/2008 0118   HGBUR NEGATIVE 11/27/2016 2334   BILIRUBINUR NEGATIVE 11/27/2016 2334   KETONESUR NEGATIVE 11/27/2016 2334   PROTEINUR NEGATIVE 11/27/2016 2334   UROBILINOGEN 0.2 12/27/2008 0118   NITRITE NEGATIVE 11/27/2016 2334   LEUKOCYTESUR NEGATIVE 11/27/2016 2334   Sepsis Labs: Invalid input(s): PROCALCITONIN, LACTICIDVEN  Recent Results (from the past 240 hour(s))  Blood Culture (routine x 2)     Status: None (Preliminary result)   Collection Time: 11/27/16  7:40 PM  Result Value Ref Range Status   Specimen Description BLOOD BLOOD RIGHT FOREARM  Final  Special Requests   Final    BOTTLES DRAWN AEROBIC AND ANAEROBIC Blood Culture adequate volume   Culture   Final    NO GROWTH 1 DAY Performed at [facility], [place] [address] [place], [place] [address]    Report Status PENDING  Incomplete  Blood Culture (routine x 2)     Status: None (Preliminary result)   Collection Time: 11/27/16  8:00 PM  Result Value Ref Range Status   Specimen Description BLOOD BLOOD RIGHT FOREARM  Final   Special Requests   Final    BOTTLES DRAWN AEROBIC AND ANAEROBIC Blood Culture adequate volume   Culture   Final    NO GROWTH 1 DAY Performed at [facility], [place] [address], [place], [place] [address]    Report Status PENDING  Incomplete  MRSA PCR Screening     Status: None   Collection Time: 11/28/16  1:10 AM  Result Value Ref Range Status   MRSA by PCR NEGATIVE NEGATIVE Final    Comment:        The GeneXpert MRSA Assay (FDA approved for NASAL specimens only), is one component of a comprehensive MRSA colonization surveillance program. It is not intended to diagnose MRSA infection nor to guide or monitor treatment for MRSA infections.   Respiratory Panel by PCR     Status: None    Collection Time: 11/28/16  5:30 AM  Result Value Ref Range Status   Adenovirus NOT DETECTED NOT DETECTED Final   Coronavirus 229E NOT DETECTED NOT DETECTED Final   Coronavirus HKU1 NOT DETECTED NOT DETECTED Final   Coronavirus NL63 NOT DETECTED NOT DETECTED Final   Coronavirus OC43 NOT DETECTED NOT DETECTED Final   Metapneumovirus NOT DETECTED NOT DETECTED Final   Rhinovirus / Enterovirus NOT DETECTED NOT DETECTED Final   Influenza A NOT DETECTED NOT DETECTED Final   Influenza B NOT DETECTED NOT DETECTED Final   Parainfluenza Virus 1 NOT DETECTED NOT DETECTED Final   Parainfluenza Virus 2 NOT DETECTED NOT DETECTED Final   Parainfluenza Virus 3 NOT DETECTED NOT DETECTED Final   Parainfluenza Virus 4 NOT DETECTED NOT DETECTED Final   Respiratory Syncytial Virus NOT DETECTED NOT DETECTED Final   Bordetella pertussis NOT DETECTED NOT DETECTED Final   Chlamydophila pneumoniae NOT DETECTED NOT DETECTED Final   Mycoplasma pneumoniae NOT DETECTED NOT DETECTED Final    Comment: Performed at [facility], [place] [address], [place], [place] [address]      Radiology Studies: No results found.   Scheduled Meds: . aspirin EC  81 mg Oral Daily  . enoxaparin (LOVENOX) injection  40 mg Subcutaneous Q24H  . feeding supplement (ENSURE ENLIVE)  237 mL Oral TID BM  . metoprolol tartrate  25 mg Oral BID  . multivitamin with minerals  1 tablet Oral Daily  . simvastatin  40 mg Oral QHS   Continuous Infusions: . azithromycin Stopped (11/29/16 2211)  . ceFEPime (MAXIPIME) IV 1 g (11/30/16 0600)     Marzetta Board, MD, PhD [facility] Pager [phone]  If 7PM-7AM, please contact night-coverage [email or web address] Password [facility] 11/30/2016, 11:21 AM

## 2016-12-01 DIAGNOSIS — L8990 Pressure ulcer of unspecified site, unspecified stage: Secondary | ICD-10-CM | POA: Diagnosis present

## 2016-12-01 LAB — BASIC METABOLIC PANEL
Anion gap: 8 (ref 5–15)
BUN: 12 mg/dL (ref 6–20)
CO2: 23 mmol/L (ref 22–32)
CREATININE: 0.62 mg/dL (ref 0.61–1.24)
Calcium: 9.2 mg/dL (ref 8.9–10.3)
Chloride: 104 mmol/L (ref 101–111)
GFR calc Af Amer: >60 mL/min (ref >60)
GLUCOSE: 113 mg/dL — AB (ref 65–99)
Potassium: 3.7 mmol/L (ref 3.5–5.1)
SODIUM: 135 mmol/L (ref 135–145)

## 2016-12-01 LAB — CBC
HCT: 26.3 % — ABNORMAL LOW (ref 39.0–52.0)
Hemoglobin: 8.4 g/dL — ABNORMAL LOW (ref 13.0–17.0)
MCH: 25.8 pg — ABNORMAL LOW (ref 26.0–34.0)
MCHC: 31.9 g/dL (ref 30.0–36.0)
MCV: 80.9 fL (ref 78.0–100.0)
PLATELETS: 555 10*3/uL — AB (ref 150–400)
RBC: 3.25 MIL/uL — ABNORMAL LOW (ref 4.22–5.81)
RDW: 16.8 % — AB (ref 11.5–15.5)
WBC: 23.4 10*3/uL — AB (ref 4.0–10.5)

## 2016-12-01 MED ORDER — POLYETHYLENE GLYCOL 3350 17 G PO PACK
17.0000 g | PACK | Freq: Every day | ORAL | Status: DC
  Administered 2016-12-01 – 2016-12-03 (×3): 17 g via ORAL
  Filled 2016-12-01 (×3): qty 1

## 2016-12-01 MED ORDER — AMOXICILLIN-POT CLAVULANATE 500-125 MG PO TABS
1.0000 | ORAL_TABLET | Freq: Three times a day (TID) | ORAL | Status: DC
  Administered 2016-12-01 – 2016-12-03 (×6): 500 mg via ORAL
  Filled 2016-12-01 (×7): qty 1

## 2016-12-01 MED ORDER — TRAMADOL HCL 50 MG PO TABS
50.0000 mg | ORAL_TABLET | Freq: Four times a day (QID) | ORAL | Status: DC | PRN
  Administered 2016-12-02: 50 mg via ORAL
  Filled 2016-12-01: qty 1

## 2016-12-01 MED ORDER — SENNOSIDES-DOCUSATE SODIUM 8.6-50 MG PO TABS
1.0000 | ORAL_TABLET | Freq: Two times a day (BID) | ORAL | Status: DC
  Administered 2016-12-01 – 2016-12-03 (×5): 1 via ORAL
  Filled 2016-12-01 (×5): qty 1

## 2016-12-01 MED ORDER — MEGESTROL ACETATE 400 MG/10ML PO SUSP
400.0000 mg | Freq: Every day | ORAL | Status: DC
  Administered 2016-12-02: 400 mg via ORAL
  Filled 2016-12-01: qty 10

## 2016-12-01 NOTE — Progress Notes (Signed)
Pt BP low at 91/50.  MD aware.  Will continue to monitor.  Orders received. Andre Lefort

## 2016-12-01 NOTE — Progress Notes (Signed)
Met with pt and family to provide SNF bed offers. Pt selects [facility]. CSW confirmed with admissions, selected facility in [place] Will initiate prior authorization from pt's insurance ([employer]).  Sharren Bridge, MSW, LCSW Clinical Social Work 12/01/2016 [phone]

## 2016-12-01 NOTE — Care Management Important Message (Signed)
Important Message  Patient Details  Name: Shameek Nyquist MRN: [id number] Date of Birth: 09-11-1935   Medicare Important Message Given:  Yes    Kerin Salen 12/01/2016, 10:45 AMImportant Message  Patient Details  Name: Jameek Bruntz MRN: [id number] Date of Birth: 07-Dec-1935   Medicare Important Message Given:  Yes    Kerin Salen 12/01/2016, 10:45 AM

## 2016-12-01 NOTE — Progress Notes (Signed)
PROGRESS NOTE  Browning Southwood YPP:[id number] DOB: 1935-12-05 DOA: 11/27/2016 PCP: Lars Mage, MD   LOS: 4 days   Brief Narrative / Interim history: Rilee Wendling is a 81 y.o. male with medical history significant of metastasized skin squamous cell cancer (s/p of XRT), seborrheic keratoses, hypertension, hyperlipidemia, dCHF, venous insufficiency changes, who presents with altered mental status, cough and fever.  Chest x-ray was concerning for pneumonia at the bases.  He was admitted for healthcare associated pneumonia  8/27 -improving, transfer out of stepdown.  8/28 -Education officer, museum consulted as PT recommending SNF  Assessment & Plan: Principal Problem:   HCAP (healthcare-associated pneumonia) Active Problems:   Dyslipidemia   Essential hypertension   Chronic diastolic congestive heart failure (HCC)   Chronic venous insufficiency   Sepsis ([place])   Acute metabolic encephalopathy   Acute urinary retention   Protein-calorie malnutrition, severe   Pressure injury of skin   Sepsis due to HCAP -Patient meets criteria for sepsis with fever, tachycardia, chest x-ray with evidence of pneumonia -Patient started on broad-spectrum antibiotics with cefepime and azithromycin, vancomycin. Transition to Augmentin. -Blood cultures obtained in the emergency room, still without growth today -Respiratory panel negative -WBC continuously increasing today, no diarrhea/no new symptoms. Closely monitor  Chronic diastolic CHF -Most recent 2D echo was in 2016 had an EF of 50-55% with grade 1 diastolic dysfunction.  Appears euvolemic right now, will closely monitor fluid status -Patient improving, sepsis physiology resolved, he is alert, eating, discontinued IV fluids  Acute urinary retention -Possibly due to sepsis/encephalopathy, UA without evidence of infection. Voiding after removal of the catheter. Monitor.  Acute encephalopathy -Likely in the setting of #1, seems to be improving per my discussion  with patient this morning -discussed at length with patient's wife at bedside 8/27, he has been having intermittent memory problems for several weeks if not months.  Suspect early underlying dementia  Hypertension -Hold amlodipine for now.  Had some low reading into the 45Y systolic overnight, discussed with RN, unlikely to be accurate.  Blood pressure in the 130s 140s this morning  Hyperlipidemia -Zocor   Metastatic squamous cell carcinoma and left axillary mass -Status post surgical removal as well as radiation in the past, with recurrence, considered currently for treatment.  Followed as an outpatient by Dr. Lindi Adie  Constipation. Poor by mouth intake. Protein calorie malnutrition. Continue nutritional supplementation. Adding stool softeners.  DVT prophylaxis: Lovenox Code Status: Full code Family Communication: Discussed with patient's sister and wife at bedside 8/29 Disposition Plan: Likely discharge tomorrow.   Consultants:   None   Procedures:   None   Antimicrobials:  Cefepime 8/25 >>  Azithromycin 8/25 >>   Vancomycin 8/25 >> 8/27  Subjective: Feeling better. Tired and fatigued. No nausea no vomiting. Constipated. Poor oral intake due to patient choice.  Objective: Vitals:   11/30/16 1600 11/30/16 1645 12/01/16 0600 12/01/16 1400  BP:  125/70 113/64 (!) 90/50  Pulse:  80 89 85  Resp:   16 18  Temp: 99.5 F (37.5 C) 99.2 F (37.3 C) (!) 100.8 F (38.2 C) 98.9 F (37.2 C)  TempSrc: Oral Oral Oral Oral  SpO2:  98% 99% 90%  Weight:      Height:        Intake/Output Summary (Last 24 hours) at 12/01/16 1658 Last data filed at 12/01/16 1617  Gross per 24 hour  Intake              780 ml  Output  300 ml  Net              480 ml   Filed Weights   11/27/16 1935  Weight: 79.4 kg (175 lb)    Examination:  Vitals:   11/30/16 1600 11/30/16 1645 12/01/16 0600 12/01/16 1400  BP:  125/70 113/64 (!) 90/50  Pulse:  80 89 85  Resp:   16 18   Temp: 99.5 F (37.5 C) 99.2 F (37.3 C) (!) 100.8 F (38.2 C) 98.9 F (37.2 C)  TempSrc: Oral Oral Oral Oral  SpO2:  98% 99% 90%  Weight:      Height:        Constitutional: NAD Eyes: No icterus, lids and conjunctivae normal Respiratory: Clear to auscultation, no wheezing, no crackles. Cardiovascular: Regular rate and rhythm, no murmurs.  No edema. Abdomen: Soft, nontender to palpation.  Bowel sounds positive. Skin: No rashes Neurologic: Nonfocal, generally weak but equal   Data Reviewed: I have independently reviewed following labs and imaging studies     CBC:  Recent Labs Lab 11/27/16 1940 11/29/16 0309 11/30/16 0324 12/01/16 0350  WBC 17.5* 17.3* 23.3* 23.4*  NEUTROABS 14.3*  --   --   --   HGB 9.0* 8.1* 8.7* 8.4*  HCT 27.3* 25.5* 26.8* 26.3*  MCV 80.8 80.4 80.5 80.9  PLT 522* 465* 476* 989*   Basic Metabolic Panel:  Recent Labs Lab 11/27/16 1940 11/28/16 1358 11/29/16 0309 11/30/16 0324 12/01/16 0350  NA 134* 133* 135 135 135  K 3.7 3.4* 3.3* 3.8 3.7  CL 102 105 107 105 104  CO2 23 21* 20* 23 23  GLUCOSE 154* 98 127* 133* 113*  BUN 17 9 8 11 12   CREATININE 0.87 0.62 0.58* 0.65 0.62  CALCIUM 8.6* 8.4* 8.1* 8.7* 9.2  MG  --  1.8  --   --   --    GFR: Estimated Creatinine Clearance: 82.2 mL/min (by C-G formula based on SCr of 0.62 mg/dL). Liver Function Tests:  Recent Labs Lab 11/27/16 1940 11/29/16 0309  AST 32 22  ALT 23 17  ALKPHOS 123 88  BILITOT 0.6 0.7  PROT 6.6 5.9*  ALBUMIN 2.1* 1.9*   No results for input(s): LIPASE, AMYLASE in the last 168 hours. No results for input(s): AMMONIA in the last 168 hours. Coagulation Profile:  Recent Labs Lab 11/28/16 0154  INR 1.11   Cardiac Enzymes: No results for input(s): CKTOTAL, CKMB, CKMBINDEX, TROPONINI in the last 168 hours. BNP (last 3 results) No results for input(s): PROBNP in the last 8760 hours. HbA1C: No results for input(s): HGBA1C in the last 72 hours. CBG:  Recent  Labs Lab 11/29/16 0738  GLUCAP 106*   Lipid Profile: No results for input(s): CHOL, HDL, LDLCALC, TRIG, CHOLHDL, LDLDIRECT in the last 72 hours. Thyroid Function Tests: No results for input(s): TSH, T4TOTAL, FREET4, T3FREE, THYROIDAB in the last 72 hours. Anemia Panel: No results for input(s): VITAMINB12, FOLATE, FERRITIN, TIBC, IRON, RETICCTPCT in the last 72 hours. Urine analysis:    Component Value Date/Time   COLORURINE AMBER (A) 11/27/2016 2334   APPEARANCEUR CLEAR 11/27/2016 2334   LABSPEC 1.020 11/27/2016 2334   PHURINE 6.0 11/27/2016 2334   GLUCOSEU NEGATIVE 11/27/2016 2334   GLUCOSEU NEG mg/dL 12/27/2008 0118   HGBUR NEGATIVE 11/27/2016 2334   [place] 11/27/2016 2334   [place] 11/27/2016 2334   PROTEINUR NEGATIVE 11/27/2016 2334   UROBILINOGEN 0.2 12/27/2008 0118   NITRITE NEGATIVE 11/27/2016 2334   LEUKOCYTESUR NEGATIVE  11/27/2016 2334   Sepsis Labs: Invalid input(s): PROCALCITONIN, LACTICIDVEN  Recent Results (from the past 240 hour(s))  Blood Culture (routine x 2)     Status: None (Preliminary result)   Collection Time: 11/27/16  7:40 PM  Result Value Ref Range Status   Specimen Description BLOOD BLOOD RIGHT FOREARM  Final   Special Requests   Final    BOTTLES DRAWN AEROBIC AND ANAEROBIC Blood Culture adequate volume   Culture   Final    NO GROWTH 3 DAYS Performed at [facility], [address], [place], [place] [address]    Report Status PENDING  Incomplete  Blood Culture (routine x 2)     Status: None (Preliminary result)   Collection Time: 11/27/16  8:00 PM  Result Value Ref Range Status   Specimen Description BLOOD BLOOD RIGHT FOREARM  Final   Special Requests   Final    BOTTLES DRAWN AEROBIC AND ANAEROBIC Blood Culture adequate volume   Culture   Final    NO GROWTH 3 DAYS Performed at [facility], [place] [address], [place], [place] [address]    Report Status PENDING  Incomplete  MRSA PCR Screening     Status:  None   Collection Time: 11/28/16  1:10 AM  Result Value Ref Range Status   MRSA by PCR NEGATIVE NEGATIVE Final    Comment:        The GeneXpert MRSA Assay (FDA approved for NASAL specimens only), is one component of a comprehensive MRSA colonization surveillance program. It is not intended to diagnose MRSA infection nor to guide or monitor treatment for MRSA infections.   Respiratory Panel by PCR     Status: None   Collection Time: 11/28/16  5:30 AM  Result Value Ref Range Status   Adenovirus NOT DETECTED NOT DETECTED Final   Coronavirus 229E NOT DETECTED NOT DETECTED Final   Coronavirus HKU1 NOT DETECTED NOT DETECTED Final   Coronavirus NL63 NOT DETECTED NOT DETECTED Final   Coronavirus OC43 NOT DETECTED NOT DETECTED Final   Metapneumovirus NOT DETECTED NOT DETECTED Final   Rhinovirus / Enterovirus NOT DETECTED NOT DETECTED Final   Influenza A NOT DETECTED NOT DETECTED Final   Influenza B NOT DETECTED NOT DETECTED Final   Parainfluenza Virus 1 NOT DETECTED NOT DETECTED Final   Parainfluenza Virus 2 NOT DETECTED NOT DETECTED Final   Parainfluenza Virus 3 NOT DETECTED NOT DETECTED Final   Parainfluenza Virus 4 NOT DETECTED NOT DETECTED Final   Respiratory Syncytial Virus NOT DETECTED NOT DETECTED Final   Bordetella pertussis NOT DETECTED NOT DETECTED Final   Chlamydophila pneumoniae NOT DETECTED NOT DETECTED Final   Mycoplasma pneumoniae NOT DETECTED NOT DETECTED Final    Comment: Performed at [facility], [place] [address], [place], [place] [address]      Radiology Studies: No results found.   Scheduled Meds: . amoxicillin-clavulanate  1 tablet Oral TID  . aspirin EC  81 mg Oral Daily  . enoxaparin (LOVENOX) injection  40 mg Subcutaneous Q24H  . feeding supplement (ENSURE ENLIVE)  237 mL Oral TID BM  . polyethylene glycol  17 g Oral Daily  . senna-docusate  1 tablet Oral BID  . simvastatin  40 mg Oral QHS   Continuous Infusions:  Author:  Berle Mull,  MD [facility] Pager: [phone] 12/01/2016 5:01 PM     If 7PM-7AM, please contact night-coverage [email or web address] Password [facility] 12/01/2016, 4:58 PM

## 2016-12-01 NOTE — Progress Notes (Signed)
Physical Therapy Treatment Patient Details Name: Timothy Bradford MRN: [id number] DOB: 26-Jun-1935 Today's Date: 12/01/2016    History of Present Illness 81 y.o. male with medical history significant of metastasized skin squamous cell cancer (s/p of XRT), seborrheic keratoses, hypertension, hyperlipidemia, dCHF, venous insufficiency changes, (wife reports h/o 2 surgeries to remove cancer from LUE) who presents with altered mental status, cough and fever.  Chest x-ray was concerning for pneumonia at the bases.  He was admitted for healthcare associated pneumonia    PT Comments    The patient requires extra time for mobility. Ambulated to Bathroom with mod steady assist and RW. Patient is very weak. Continue PT.   Follow Up Recommendations  SNF     Equipment Recommendations  Cane    Recommendations for Other Services       Precautions / Restrictions Precautions Precautions: Fall Precaution Comments: painful, non functional LUE    Mobility  Bed Mobility   Bed Mobility: Supine to Sit     Supine to sit: HOB elevated     General bed mobility comments: extra time to initiate and mobilize  Transfers Overall transfer level: Needs assistance Equipment used: Rolling walker (2 wheeled) Transfers: Sit to/from Stand Sit to Stand: Mod assist         General transfer comment: assist to rise/steady, assist left hand placement onto RW ; steady assist to rise from bed and [facility]  Ambulation/Gait Ambulation/Gait assistance: Mod assist Ambulation Distance (Feet): 10 Feet Assistive device: Rolling walker (2 wheeled) Gait Pattern/deviations: Step-to pattern;Step-through pattern;Staggering right;Staggering left     General Gait Details: assist to steady with RW, staggering .  after  finished with toiler, assisted to recliner brought up to the BR door.   Stairs            Wheelchair Mobility    Modified Rankin (Stroke Patients Only)       Balance Overall balance assessment:  Needs assistance   Sitting balance-Leahy Scale: Fair     Standing balance support: Bilateral upper extremity supported;Single extremity supported Standing balance-Leahy Scale: Poor                              Cognition Arousal/Alertness: Awake/alert Behavior During Therapy: Flat affect Overall Cognitive Status: Impaired/Different from baseline Area of Impairment: Memory;Safety/judgement                     Memory: Decreased short-term memory   Safety/Judgement: Decreased awareness of deficits            Exercises      General Comments        Pertinent Vitals/Pain Pain Assessment: No/denies pain Pain Location: appears to have pain about the left shoulder Pain Descriptors / Indicators: Guarding    Home Living                      Prior Function            PT Goals (current goals can now be found in the care plan section) Progress towards PT goals: Progressing toward goals    Frequency    Min 3X/week      PT Plan Current plan remains appropriate    Co-evaluation              AM-PAC PT "6 Clicks" Daily Activity  Outcome Measure  Difficulty turning over in bed (including adjusting bedclothes, sheets and blankets)?: Unable Difficulty moving from  lying on back to sitting on the side of the bed? : Unable Difficulty sitting down on and standing up from a chair with arms (e.g., wheelchair, bedside commode, etc,.)?: Unable Help needed moving to and from a bed to chair (including a wheelchair)?: Total Help needed walking in hospital room?: Total Help needed climbing 3-5 steps with a railing? : Total 6 Click Score: 6    End of Session Equipment Utilized During Treatment: Gait belt Activity Tolerance: Patient limited by fatigue Patient left: with call bell/phone within reach;with chair alarm set Nurse Communication: Mobility status PT Visit Diagnosis: Unsteadiness on feet (R26.81);Pain Pain - Right/Left: Left Pain - part  of body: Arm     Time: 8251-8984 PT Time Calculation (min) (ACUTE ONLY): 38 min  Charges:  $Gait Training: 8-22 mins $Self Care/Home Management: 8-22                    G CodesTresa Endo PT [phone]  Claretha Cooper 12/01/2016, 4:15 PM

## 2016-12-01 NOTE — Progress Notes (Signed)
Pt not attempting to get OOB all day and following instructions.  MD notified and [employer] dc'd. Andre Lefort

## 2016-12-02 ENCOUNTER — Inpatient Hospital Stay (HOSPITAL_COMMUNITY): Payer: PPO

## 2016-12-02 DIAGNOSIS — M7989 Other specified soft tissue disorders: Secondary | ICD-10-CM

## 2016-12-02 LAB — BASIC METABOLIC PANEL
ANION GAP: 7 (ref 5–15)
BUN: 17 mg/dL (ref 6–20)
CHLORIDE: 103 mmol/L (ref 101–111)
CO2: 24 mmol/L (ref 22–32)
Calcium: 9 mg/dL (ref 8.9–10.3)
Creatinine, Ser: 0.69 mg/dL (ref 0.61–1.24)
GFR calc non Af Amer: >60 mL/min (ref >60)
Glucose, Bld: 109 mg/dL — ABNORMAL HIGH (ref 65–99)
Potassium: 3.2 mmol/L — ABNORMAL LOW (ref 3.5–5.1)
SODIUM: 134 mmol/L — AB (ref 135–145)

## 2016-12-02 LAB — CBC
HEMATOCRIT: 30.4 % — AB (ref 39.0–52.0)
HEMOGLOBIN: 9.6 g/dL — AB (ref 13.0–17.0)
MCH: 25.1 pg — ABNORMAL LOW (ref 26.0–34.0)
MCHC: 31.6 g/dL (ref 30.0–36.0)
MCV: 79.6 fL (ref 78.0–100.0)
Platelets: 442 10*3/uL — ABNORMAL HIGH (ref 150–400)
RBC: 3.82 MIL/uL — AB (ref 4.22–5.81)
RDW: 16.5 % — ABNORMAL HIGH (ref 11.5–15.5)
WBC: 19.2 10*3/uL — AB (ref 4.0–10.5)

## 2016-12-02 LAB — LACTIC ACID, PLASMA
LACTIC ACID, VENOUS: 2 mmol/L — AB (ref 0.5–1.9)
LACTIC ACID, VENOUS: 3.6 mmol/L — AB (ref 0.5–1.9)

## 2016-12-02 LAB — D-DIMER, QUANTITATIVE (NOT AT ARMC): D DIMER QUANT: 4.27 ug{FEU}/mL — AB (ref 0.00–0.50)

## 2016-12-02 LAB — T4, FREE: FREE T4: 1.4 ng/dL — AB (ref 0.61–1.12)

## 2016-12-02 LAB — MAGNESIUM: MAGNESIUM: 2 mg/dL (ref 1.7–2.4)

## 2016-12-02 LAB — TSH: TSH: 3.772 u[IU]/mL (ref 0.350–4.500)

## 2016-12-02 LAB — CORTISOL: CORTISOL PLASMA: 10 ug/dL

## 2016-12-02 MED ORDER — MEGESTROL ACETATE 40 MG/ML PO SUSP: 400.0000 mg | Freq: Every day | ORAL | 0 refills | Status: DC

## 2016-12-02 MED ORDER — ENSURE ENLIVE PO LIQD: 237.0000 mL | Freq: Three times a day (TID) | ORAL | 12 refills | Status: AC

## 2016-12-02 MED ORDER — DM-GUAIFENESIN ER 30-600 MG PO TB12: 1.0000 | ORAL_TABLET | Freq: Two times a day (BID) | ORAL | 0 refills | Status: AC

## 2016-12-02 MED ORDER — MIRTAZAPINE 15 MG PO TABS: 15.0000 mg | ORAL_TABLET | Freq: Every day | ORAL | 0 refills | Status: AC

## 2016-12-02 MED ORDER — POTASSIUM CHLORIDE CRYS ER 20 MEQ PO TBCR
40.0000 meq | EXTENDED_RELEASE_TABLET | Freq: Once | ORAL | Status: AC
  Administered 2016-12-02: 40 meq via ORAL
  Filled 2016-12-02: qty 2

## 2016-12-02 MED ORDER — DICLOFENAC SODIUM 1 % TD GEL
2.0000 g | Freq: Four times a day (QID) | TRANSDERMAL | Status: DC
  Administered 2016-12-02 – 2016-12-03 (×2): 2 g via TOPICAL
  Filled 2016-12-02: qty 100

## 2016-12-02 MED ORDER — LIDOCAINE 5 % EX PTCH
1.0000 | MEDICATED_PATCH | CUTANEOUS | Status: DC
  Administered 2016-12-02: 1 via TRANSDERMAL
  Filled 2016-12-02: qty 1

## 2016-12-02 MED ORDER — LIDOCAINE 5 % EX PTCH: 1.0000 | MEDICATED_PATCH | CUTANEOUS | 0 refills | Status: DC

## 2016-12-02 MED ORDER — HYDROCODONE-ACETAMINOPHEN 5-325 MG PO TABS
1.0000 | ORAL_TABLET | Freq: Four times a day (QID) | ORAL | 0 refills | Status: DC | PRN
Start: 2016-12-02 — End: 2017-01-10

## 2016-12-02 MED ORDER — MIRTAZAPINE 15 MG PO TABS
15.0000 mg | ORAL_TABLET | Freq: Every day | ORAL | Status: DC
  Administered 2016-12-02: 15 mg via ORAL
  Filled 2016-12-02: qty 1

## 2016-12-02 MED ORDER — SODIUM CHLORIDE 0.9 % IV BOLUS (SEPSIS): 1000.0000 mL | INTRAVENOUS | Status: DC

## 2016-12-02 MED ORDER — SODIUM CHLORIDE 0.9 % IV BOLUS (SEPSIS)
1000.0000 mL | Freq: Once | INTRAVENOUS | Status: AC
  Administered 2016-12-02: 1000 mL via INTRAVENOUS

## 2016-12-02 MED ORDER — AMOXICILLIN-POT CLAVULANATE 500-125 MG PO TABS: 1.0000 | ORAL_TABLET | Freq: Three times a day (TID) | ORAL | 0 refills | Status: AC

## 2016-12-02 MED ORDER — HYDROCODONE-ACETAMINOPHEN 5-325 MG PO TABS: 1.0000 | ORAL_TABLET | Freq: Four times a day (QID) | ORAL | Status: DC | PRN

## 2016-12-02 MED ORDER — IOPAMIDOL (ISOVUE-370) INJECTION 76%
100.0000 mL | Freq: Once | INTRAVENOUS | Status: AC | PRN
  Administered 2016-12-02: 100 mL via INTRAVENOUS

## 2016-12-02 MED ORDER — ENOXAPARIN SODIUM 80 MG/0.8ML ~~LOC~~ SOLN
1.0000 mg/kg | Freq: Two times a day (BID) | SUBCUTANEOUS | Status: DC
  Administered 2016-12-03: 80 mg via SUBCUTANEOUS
  Filled 2016-12-02: qty 0.8

## 2016-12-02 MED ORDER — POLYETHYLENE GLYCOL 3350 17 G PO PACK: 17.0000 g | PACK | Freq: Every day | ORAL | 0 refills | Status: AC

## 2016-12-02 MED ORDER — POTASSIUM CHLORIDE IN NACL 40-0.9 MEQ/L-% IV SOLN
INTRAVENOUS | Status: DC
  Administered 2016-12-02 – 2016-12-03 (×2): 75 mL/h via INTRAVENOUS
  Filled 2016-12-02 (×2): qty 1000

## 2016-12-02 MED ORDER — IOPAMIDOL (ISOVUE-370) INJECTION 76%
INTRAVENOUS | Status: AC
  Filled 2016-12-02: qty 100

## 2016-12-02 NOTE — Progress Notes (Signed)
CRITICAL VALUE ALERT  Critical Value:  Lactic Acid 3.6  Date & Time Notied:  12/02/16 1915  Provider Notified: X. Blount  Orders Received/Actions taken: No response

## 2016-12-02 NOTE — Progress Notes (Signed)
Chaplain responding to spiritual care consult re: Advance directives.    Pt receiving care at chaplain arrival.  Will follow up to complete adv. Dir. With pt.

## 2016-12-02 NOTE — Discharge Summary (Signed)
[facility] Discharge Summary   Patient: Timothy Bradford NOM:[id number]   PCP: Timothy Mage, MD DOB: [birth year]   Date of admission: 11/27/2016   Date of discharge:  12/02/2016    Discharge Diagnoses:  Principal Problem:   HCAP (healthcare-associated pneumonia) Active Problems:   Dyslipidemia   Essential hypertension   Chronic diastolic congestive heart failure ([place])   Chronic venous insufficiency   Sepsis ([place])   Acute metabolic encephalopathy   Acute urinary retention   Protein-calorie malnutrition, severe   Pressure injury of skin   Admitted From: home Disposition:  SNF  Recommendations for Outpatient Follow-up:  1. Please follow up with PCP in 1 week    Contact information for follow-up providers    Timothy Bradford, Timothy Spurr, MD. Schedule an appointment as soon as possible for a visit in 1 week(s).   Specialty:  Internal Medicine Contact information: [place] [address] [phone]        Timothy Lose, MD. Schedule an appointment as soon as possible for a visit in 2 week(s).   Specialty:  Hematology and Oncology Contact information: [place] [id number] [phone]            Contact information for after-discharge care    [facility] Follow up.   Specialty:  Skilled Nursing Facility Contact information: [address] [place] [phone]                 Diet recommendation: regular diet  Activity: The patient is advised to gradually reintroduce usual activities.  Discharge Condition: good  Code Status: full code  History of present illness: As per the H and P dictated on admission, "Timothy Bradford is a 81 y.o. male with medical history significant of metastasized skin squamous cell cancer (s/p of XRT), seborrheic keratoses, hypertension, hyperlipidemia, dCHF, venous insufficiency changes, who presents with altered mental status, cough and  fever.  Per patient's wife, patient has been lethargic, mildly confused in the past 4 days. He has fever, chills and cough. No SOB. Patient had left sided chest pain earlier, which has resolved. No sputum production or coughing. No runny nose or sore throat. Patient does not have nausea, vomiting, diarrhea, abdominal pain, symptoms of UTI. No unilateral weakness. Patient had difficulty urinating. Bladder scan done and had a residual of 500. Foley catheter will be placed.  Patient is a teaching service patient , but needs to be in stepdown bed  ED Course: pt was found to have WBC 17.5, lactic acid 2.54, 1.11, pending urinalysis, electrolytes renal function okay, temperature 102.5, tachycardia, tachypnea, oxygen saturation 96% on room air, blood pressure 90/56, chest x-ray showed basilar opacity for possible bronchopneumonia"  Hospital Course:  Summary of his active problems in the hospital is as following. Sepsis due to HCAP -Patient meets criteria for sepsis with fever, tachycardia, chest x-ray with evidence of pneumonia -Patient started on broad-spectrum antibiotics with cefepime and azithromycin, vancomycin. Transition to Augmentin. -Blood cultures obtained in the emergency room, still without growth today -Respiratory panel negative -WBC better, no diarrhea/no new symptoms. Closely monitor. Continue augmentin.   Chronic diastolic CHF -Most recent 2D echo was in 2016 had an EF of 50-55% with grade 1 diastolic dysfunction.  Appears euvolemic right now, will closely monitor fluid status -Patient improving, sepsis physiology resolved, he is alert, eating.  Acute urinary retention -Possibly due to sepsis/encephalopathy, UA without evidence of infection. Voiding after removal of the catheter. Monitor.  Acute encephalopathy -Likely in the setting of #1,  - better - discussed at length with patient's wife at bedside 8/27, he has been having intermittent memory problems for several weeks  if not months.  Suspect early underlying dementia  Hypertension -Hold amlodipine for now.  Blood pressure stable.   Hyperlipidemia -Zocor   Metastatic squamous cell carcinoma and left axillary mass -Status post surgical removal as well as radiation in the past, with recurrence, considered currently for treatment.  Followed as an outpatient by Dr. Lindi Bradford Pain control is difficult. Percocet drops blood pressure and cause confusion,  Will try norco and diclofenac cream .  lidocaine not covered by insurance.   Constipation. Poor by mouth intake. Protein calorie malnutrition. Continue nutritional supplementation. Adding stool softeners.  Added Remeron, megace not covered by insurance   All other chronic medical condition were stable during the hospitalization.  Patient was seen by physical therapy, who recommended SNF, which was arranged by Education officer, museum and case Freight forwarder. On the day of the discharge the patient's vitals were stable, and no other acute medical condition were reported by patient. the patient was felt safe to be discharge at [facility] with tehrapy.  Procedures and Results:  none   Consultations:  none  DISCHARGE MEDICATION: Current Discharge Medication List    START taking these medications   Details  amoxicillin-clavulanate (AUGMENTIN) 500-125 MG tablet Take 1 tablet (500 mg total) by mouth 3 (three) times daily. Qty: 15 tablet, Refills: 0    dextromethorphan-guaiFENesin (MUCINEX DM) 30-600 MG 12hr tablet Take 1 tablet by mouth 2 (two) times daily. Qty: 30 tablet, Refills: 0    feeding supplement, ENSURE ENLIVE, (ENSURE ENLIVE) LIQD Take 237 mLs by mouth 3 (three) times daily between meals. Qty: 237 mL, Refills: 12    HYDROcodone-acetaminophen (NORCO/VICODIN) 5-325 MG tablet Take 1 tablet by mouth every 6 (six) hours as needed for severe pain. Qty: 30 tablet, Refills: 0    mirtazapine (REMERON) 15 MG tablet Take 1 tablet (15 mg total) by mouth at  bedtime. Qty: 30 tablet, Refills: 0    polyethylene glycol (MIRALAX / GLYCOLAX) packet Take 17 g by mouth daily. Qty: 14 each, Refills: 0      CONTINUE these medications which have NOT CHANGED   Details  acetaminophen (TYLENOL) 500 MG tablet Take 500 mg by mouth every 6 (six) hours as needed (for pain.).    aspirin EC 81 MG tablet Take 81 mg by mouth daily.     diclofenac sodium (VOLTAREN) 1 % GEL APPLY 2 GRAMS TOPICALLY 4 TIMES A DAY Qty: 100 g, Refills: 2    simvastatin (ZOCOR) 40 MG tablet TAKE 1 TABLET BY MOUTH AT BEDTIME Qty: 90 tablet, Refills: 3    ibuprofen (ADVIL,MOTRIN) 800 MG tablet Take 1 tablet (800 mg total) by mouth every 8 (eight) hours as needed. Qty: 30 tablet, Refills: 0      STOP taking these medications     amLODipine (NORVASC) 2.5 MG tablet      metoprolol tartrate (LOPRESSOR) 25 MG tablet      oxyCODONE-acetaminophen (PERCOCET/ROXICET) 5-325 MG tablet      potassium chloride SA (K-DUR,KLOR-CON) 20 MEQ tablet        Allergies  Allergen Reactions  . Ace Inhibitors Swelling    ANDIOEDEMA  . Fosinopril Sodium Swelling    ANGIOEDEMA [ACE INHIBITOR]   Discharge Instructions    Diet - low sodium heart healthy    Complete by:  As directed    Diet general  Complete by:  As directed    Discharge instructions    Complete by:  As directed    It is important that you read following instructions as well as go over your medication list with RN to help you understand your care after this hospitalization.  Discharge Instructions: Please follow-up with PCP in one week  Please request your primary care physician to go over all Hospital Tests and Procedure/Radiological results at the follow up,  Please get all Hospital records sent to your PCP by signing hospital release before you go home.   Do not take more than prescribed Pain, Sleep and Anxiety Medications. You were cared for by a hospitalist during your hospital stay. If you have any questions  about your discharge medications or the care you received while you were in the hospital after you are discharged, you can call the unit and ask to speak with the hospitalist on call if the hospitalist that took care of you is not available.  Once you are discharged, your primary care physician will handle any further medical issues. Please note that NO REFILLS for any discharge medications will be authorized once you are discharged, as it is imperative that you return to your primary care physician (or establish a relationship with a primary care physician if you do not have one) for your aftercare needs so that they can reassess your need for medications and monitor your lab values. You Must read complete instructions/literature along with all the possible adverse reactions/side effects for all the Medicines you take and that have been prescribed to you. Take any new Medicines after you have completely understood and accept all the possible adverse reactions/side effects. Wear Seat belts while driving. If you have smoked or chewed Tobacco in the last 2 yrs please stop smoking and/or stop any Recreational drug use.   Discharge instructions    Complete by:  As directed    It is important that you read following instructions as well as go over your medication list with RN to help you understand your care after this hospitalization.  Discharge Instructions: Please follow-up with PCP in one week  Please request your primary care physician to go over all Hospital Tests and Procedure/Radiological results at the follow up,  Please get all Hospital records sent to your PCP by signing hospital release before you go home.   Do not take more than prescribed Pain, Sleep and Anxiety Medications. You were cared for by a hospitalist during your hospital stay. If you have any questions about your discharge medications or the care you received while you were in the hospital after you are discharged, you can call  the unit and ask to speak with the hospitalist on call if the hospitalist that took care of you is not available.  Once you are discharged, your primary care physician will handle any further medical issues. Please note that NO REFILLS for any discharge medications will be authorized once you are discharged, as it is imperative that you return to your primary care physician (or establish a relationship with a primary care physician if you do not have one) for your aftercare needs so that they can reassess your need for medications and monitor your lab values. You Must read complete instructions/literature along with all the possible adverse reactions/side effects for all the Medicines you take and that have been prescribed to you. Take any new Medicines after you have completely understood and accept all the possible adverse reactions/side effects. Wear Seat  belts while driving. If you have smoked or chewed Tobacco in the last 2 yrs please stop smoking and/or stop any Recreational drug use.   Increase activity slowly    Complete by:  As directed    Increase activity slowly    Complete by:  As directed      Discharge Exam: Filed Weights   11/27/16 1935  Weight: 79.4 kg (175 lb)   Vitals:   12/02/16 0454 12/02/16 1021  BP: (!) 102/55 105/65  Pulse: 65 (!) 113  Resp: 18 20  Temp: 99.4 F (37.4 C) 98.1 F (36.7 C)  SpO2: 98% 93%   General: Appear in mild distress, no Rash; Oral Mucosa moist. Cardiovascular: S1 and S2 Present, no Murmur, no JVD Respiratory: Bilateral Air entry present and basal Crackles, no wheezes Abdomen: Bowel Sound present, Soft and no tenderness Extremities: no Pedal edema, no calf tenderness Neurology: Grossly no focal neuro deficit.  The results of significant diagnostics from this hospitalization (including imaging, microbiology, ancillary and laboratory) are listed below for reference.    Significant Diagnostic Studies: Dg Chest Port 1 View  Result Date:  11/27/2016 CLINICAL DATA:  Fever EXAM: PORTABLE CHEST 1 VIEW COMPARISON:  10/13/2016 chest x-ray.  CT 06/24/2016. FINDINGS: Streaky bilateral lung opacities. There is pleural based density in the peripheral left chest, with overlying malignant appearing second and third rib erosions. Patient has history of axillary radiotherapy. Normal heart size and stable aortic tortuosity. Stable biapical pleural thickening. No edema, effusion, or pneumothorax. Emphysema by prior CT. Previous left axillary dissection. IMPRESSION: 1. Streaky opacities at the bases which could be atelectasis or bronchopneumonia. 2. History of squamous cell carcinoma in the left axilla with lateral left second and third rib erosions. Neighboring subpleural density could be from previous radiotherapy or tumor. 3. Emphysema. Electronically Signed   By: Monte Fantasia M.D.   On: 11/27/2016 19:34    Microbiology: Recent Results (from the past 240 hour(s))  Blood Culture (routine x 2)     Status: None (Preliminary result)   Collection Time: 11/27/16  7:40 PM  Result Value Ref Range Status   Specimen Description BLOOD BLOOD RIGHT FOREARM  Final   Special Requests   Final    BOTTLES DRAWN AEROBIC AND ANAEROBIC Blood Culture adequate volume   Culture   Final    NO GROWTH 4 DAYS Performed at [facility], [address], [place], [place] [address]    Report Status PENDING  Incomplete  Blood Culture (routine x 2)     Status: None (Preliminary result)   Collection Time: 11/27/16  8:00 PM  Result Value Ref Range Status   Specimen Description BLOOD BLOOD RIGHT FOREARM  Final   Special Requests   Final    BOTTLES DRAWN AEROBIC AND ANAEROBIC Blood Culture adequate volume   Culture   Final    NO GROWTH 4 DAYS Performed at [facility], [place] [address] [place], [place] [address]    Report Status PENDING  Incomplete  MRSA PCR Screening     Status: None   Collection Time: 11/28/16  1:10 AM  Result Value Ref Range Status    MRSA by PCR NEGATIVE NEGATIVE Final    Comment:        The GeneXpert MRSA Assay (FDA approved for NASAL specimens only), is one component of a comprehensive MRSA colonization surveillance program. It is not intended to diagnose MRSA infection nor to guide or monitor treatment for MRSA infections.   Respiratory  Panel by PCR     Status: None   Collection Time: 11/28/16  5:30 AM  Result Value Ref Range Status   Adenovirus NOT DETECTED NOT DETECTED Final   Coronavirus 229E NOT DETECTED NOT DETECTED Final   Coronavirus HKU1 NOT DETECTED NOT DETECTED Final   Coronavirus NL63 NOT DETECTED NOT DETECTED Final   Coronavirus OC43 NOT DETECTED NOT DETECTED Final   Metapneumovirus NOT DETECTED NOT DETECTED Final   Rhinovirus / Enterovirus NOT DETECTED NOT DETECTED Final   Influenza A NOT DETECTED NOT DETECTED Final   Influenza B NOT DETECTED NOT DETECTED Final   Parainfluenza Virus 1 NOT DETECTED NOT DETECTED Final   Parainfluenza Virus 2 NOT DETECTED NOT DETECTED Final   Parainfluenza Virus 3 NOT DETECTED NOT DETECTED Final   Parainfluenza Virus 4 NOT DETECTED NOT DETECTED Final   Respiratory Syncytial Virus NOT DETECTED NOT DETECTED Final   Bordetella pertussis NOT DETECTED NOT DETECTED Final   Chlamydophila pneumoniae NOT DETECTED NOT DETECTED Final   Mycoplasma pneumoniae NOT DETECTED NOT DETECTED Final    Comment: Performed at [facility], [place] [address] [place], [place] [address]     Labs: CBC:  Recent Labs Lab 11/27/16 1940 11/29/16 0309 11/30/16 0324 12/01/16 0350 12/02/16 0418  WBC 17.5* 17.3* 23.3* 23.4* 19.2*  NEUTROABS 14.3*  --   --   --   --   HGB 9.0* 8.1* 8.7* 8.4* 9.6*  HCT 27.3* 25.5* 26.8* 26.3* 30.4*  MCV 80.8 80.4 80.5 80.9 79.6  PLT 522* 465* 476* 555* 672*   Basic Metabolic Panel:  Recent Labs Lab 11/28/16 1358 11/29/16 0309 11/30/16 0324 12/01/16 0350 12/02/16 0418  NA 133* 135 135 135 134*  K 3.4* 3.3* 3.8 3.7 3.2*  CL 105 107 105 104  103  CO2 21* 20* 23 23 24   GLUCOSE 98 127* 133* 113* 109*  BUN 9 8 11 12 17   CREATININE 0.62 0.58* 0.65 0.62 0.69  CALCIUM 8.4* 8.1* 8.7* 9.2 9.0  MG 1.8  --   --   --  2.0   Liver Function Tests:  Recent Labs Lab 11/27/16 1940 11/29/16 0309  AST 32 22  ALT 23 17  ALKPHOS 123 88  BILITOT 0.6 0.7  PROT 6.6 5.9*  ALBUMIN 2.1* 1.9*   No results for input(s): LIPASE, AMYLASE in the last 168 hours. No results for input(s): AMMONIA in the last 168 hours. Cardiac Enzymes: No results for input(s): CKTOTAL, CKMB, CKMBINDEX, TROPONINI in the last 168 hours. BNP (last 3 results)  Recent Labs  11/28/16 0154  BNP 151.6*   CBG:  Recent Labs Lab 11/29/16 0738  GLUCAP 106*   Time spent: 35 minutes  Signed:  Ordean Fouts  [facility]  12/02/2016  , 1:58 PM

## 2016-12-02 NOTE — Progress Notes (Signed)
CRITICAL VALUE ALERT  Critical Value: lactic acid 2.0  Date & Time Notied:  12/02/16 at 1640  Provider Notified: Dr. Posey Pronto  Orders Received/Actions taken: IV 1 L bolus given

## 2016-12-02 NOTE — Progress Notes (Signed)
Received [employer] prior authorization [id number] from representative [place] has selected [facility] If DC date or facility change, will need to request updated auth# and phone# [phone]. Will follow and assist- expect DC to [facility].   Sharren Bridge, MSW, LCSW Clinical Social Work 12/02/2016 [phone]

## 2016-12-02 NOTE — Clinical Social Work Placement (Addendum)
15:00 CSW notified per MD DC cancelled due to hypotension. Updated facility and family. Updated insurance authorization ([employer])  Pt discharging today to transfer to [facility] room # 105 for ST rehab. Report [phone]- ask for 100 hall RN Pt will transport via [place] completed medical necessity form and arranged transport. Wife aware and agreeable to plan. All information provided to facility via the [place].  See below for placement details.   CLINICAL SOCIAL WORK PLACEMENT  NOTE  Date:  12/02/2016  Patient Details  Name: Timothy Bradford MRN: [id number] Date of Birth: 06/22/1935  Clinical Social Work is seeking post-discharge placement for this patient at the [place] level of care (*CSW will initial, date and re-position this form in  chart as items are completed):  Yes   Patient/family provided with [place] Work Department's list of facilities offering this level of care within the geographic area requested by the patient (or if unable, by the patient's family).  Yes   Patient/family informed of their freedom to choose among providers that offer the needed level of care, that participate in Medicare, Medicaid or managed care program needed by the patient, have an available bed and are willing to accept the patient.  Yes   Patient/family informed of [facility]'s ownership interest in [facility] and [facility], as well as of the fact that they are under no obligation to receive care at these facilities.  PASRR submitted to EDS on 11/30/16     PASRR number received on 11/30/16     Existing PASRR number confirmed on       FL2 transmitted to all facilities in geographic area requested by pt/family on 11/30/16     FL2 transmitted to all facilities within larger geographic area on       Patient informed that his/her managed care company has contracts with or will negotiate with certain facilities, including the  following:        Yes   Patient/family informed of bed offers received.  Patient chooses bed at [facility]     Physician recommends and patient chooses bed at [facility]    Patient to be transferred to [facility] on 12/02/16.  Patient to be transferred to facility by [facility]     Patient family notified on 12/02/16 of transfer.  Name of family member notified:  wife [name]     PHYSICIAN       Additional Comment:    _______________________________________________ Nila Nephew, LCSW 12/02/2016, 2:29 PM

## 2016-12-02 NOTE — Progress Notes (Signed)
PROGRESS NOTE  Timothy Bradford RKY:[id number] DOB: 03-18-1936 DOA: 11/27/2016 PCP: Lars Mage, MD   LOS: 5 days   Brief Narrative / Interim history: Timothy Bradford is a 81 y.o. male with medical history significant of metastasized skin squamous cell cancer (s/p of XRT), seborrheic keratoses, hypertension, hyperlipidemia, dCHF, venous insufficiency changes, who presents with altered mental status, cough and fever.  Chest x-ray was concerning for pneumonia at the bases.  He was admitted for healthcare associated pneumonia  8/27 -improving, transfer out of stepdown.  8/28 -Education officer, museum consulted as PT recommending SNF  Assessment & Plan: Principal Problem:   HCAP (healthcare-associated pneumonia) Active Problems:   Dyslipidemia   Essential hypertension   Chronic diastolic congestive heart failure (HCC)   Chronic venous insufficiency   Sepsis ([place])   Acute metabolic encephalopathy   Acute urinary retention   Protein-calorie malnutrition, severe   Pressure injury of skin   Sepsis due to HCAP -Patient meets criteria for sepsis with fever, tachycardia, chest x-ray with evidence of pneumonia -Patient started on broad-spectrum antibiotics with cefepime and azithromycin, vancomycin. Transition to Augmentin. -Blood cultures obtained in the emergency room, still without growth today -Respiratory panel negative -WBC getting better. We'll monitor.  Chronic diastolic CHF -Most recent 2D echo was in 2016 had an EF of 50-55% with grade 1 diastolic dysfunction.  Appears euvolemic right now, will closely monitor fluid status -Patient improving, sepsis physiology resolved, he is alert, eating, patient now hypotensive. We will give IV bolus and monitor.  Acute urinary retention -Possibly due to sepsis/encephalopathy, UA without evidence of infection. Voiding after removal of the catheter. Monitor.  Acute encephalopathy -Likely in the setting of #1, seems to be improving per my discussion with  patient this morning -discussed at length with patient's wife at bedside 8/27, he has been having intermittent memory problems for several weeks if not months.  Suspect early underlying dementia  Hypertension Hypotension orthostatic -Hold amlodipine for now.  Had some low reading into the 31D systolic overnight, discussed with RN, unlikely to be accurate.  Blood pressure in the 130s 140s this morning Patient now hypotensive again. Likely this is an reaction to poor by mouth intake. We will check TSH and free T4 d-dimer and lactic acid level. Home insulin bolus 1 L and continuation of normal saline infusion overnight.  Hyperlipidemia -Zocor   Metastatic squamous cell carcinoma and left axillary mass -Status post surgical removal as well as radiation in the past, with recurrence, considered currently for treatment.  Followed as an outpatient by Dr. Lindi Adie  Constipation. Poor by mouth intake. Protein calorie malnutrition. Continue nutritional supplementation. Adding stool softeners.  DVT prophylaxis: Lovenox Code Status: Full code Family Communication: Discussed with patient's sister and wife at bedside 8/29 Disposition Plan: Likely discharge tomorrow.   Consultants:   None   Procedures:   None   Antimicrobials:  Cefepime 8/25 >>  Azithromycin 8/25 >>   Vancomycin 8/25 >> 8/27  Subjective: Feeling better. No nausea no vomiting. Poor by mouth intake remains but getting better. Still constipated.  Objective: Vitals:   12/02/16 0454 12/02/16 1021 12/02/16 1400 12/02/16 1446  BP: (!) 102/55 105/65 (!) 87/53 (!) 88/50  Pulse: 65 (!) 113 99   Resp: 18 20 20    Temp: 99.4 F (37.4 C) 98.1 F (36.7 C) 97.8 F (36.6 C)   TempSrc: Oral Oral Oral   SpO2: 98% 93% 94%   Weight:      Height:        Intake/Output Summary (  Last 24 hours) at 12/02/16 1623 Last data filed at 12/02/16 1430  Gross per 24 hour  Intake              240 ml  Output              802 ml  Net              -562 ml   Filed Weights   11/27/16 1935  Weight: 79.4 kg (175 lb)    Examination:  Vitals:   12/02/16 0454 12/02/16 1021 12/02/16 1400 12/02/16 1446  BP: (!) 102/55 105/65 (!) 87/53 (!) 88/50  Pulse: 65 (!) 113 99   Resp: 18 20 20    Temp: 99.4 F (37.4 C) 98.1 F (36.7 C) 97.8 F (36.6 C)   TempSrc: Oral Oral Oral   SpO2: 98% 93% 94%   Weight:      Height:        Constitutional: NAD Eyes: No icterus, lids and conjunctivae normal Respiratory: Clear to auscultation, no wheezing, no crackles. Cardiovascular: Regular rate and rhythm, no murmurs.  No edema. Abdomen: Soft, nontender to palpation.  Bowel sounds positive. Skin: No rashes Neurologic: Nonfocal, generally weak but equal   Data Reviewed: I have independently reviewed following labs and imaging studies     CBC:  Recent Labs Lab 11/27/16 1940 11/29/16 0309 11/30/16 0324 12/01/16 0350 12/02/16 0418  WBC 17.5* 17.3* 23.3* 23.4* 19.2*  NEUTROABS 14.3*  --   --   --   --   HGB 9.0* 8.1* 8.7* 8.4* 9.6*  HCT 27.3* 25.5* 26.8* 26.3* 30.4*  MCV 80.8 80.4 80.5 80.9 79.6  PLT 522* 465* 476* 555* 956*   Basic Metabolic Panel:  Recent Labs Lab 11/28/16 1358 11/29/16 0309 11/30/16 0324 12/01/16 0350 12/02/16 0418  NA 133* 135 135 135 134*  K 3.4* 3.3* 3.8 3.7 3.2*  CL 105 107 105 104 103  CO2 21* 20* 23 23 24   GLUCOSE 98 127* 133* 113* 109*  BUN 9 8 11 12 17   CREATININE 0.62 0.58* 0.65 0.62 0.69  CALCIUM 8.4* 8.1* 8.7* 9.2 9.0  MG 1.8  --   --   --  2.0   GFR: Estimated Creatinine Clearance: 82.2 mL/min (by C-G formula based on SCr of 0.69 mg/dL). Liver Function Tests:  Recent Labs Lab 11/27/16 1940 11/29/16 0309  AST 32 22  ALT 23 17  ALKPHOS 123 88  BILITOT 0.6 0.7  PROT 6.6 5.9*  ALBUMIN 2.1* 1.9*   No results for input(s): LIPASE, AMYLASE in the last 168 hours. No results for input(s): AMMONIA in the last 168 hours. Coagulation Profile:  Recent Labs Lab 11/28/16 0154  INR  1.11   Cardiac Enzymes: No results for input(s): CKTOTAL, CKMB, CKMBINDEX, TROPONINI in the last 168 hours. BNP (last 3 results) No results for input(s): PROBNP in the last 8760 hours. HbA1C: No results for input(s): HGBA1C in the last 72 hours. CBG:  Recent Labs Lab 11/29/16 0738  GLUCAP 106*   Lipid Profile: No results for input(s): CHOL, HDL, LDLCALC, TRIG, CHOLHDL, LDLDIRECT in the last 72 hours. Thyroid Function Tests: No results for input(s): TSH, T4TOTAL, FREET4, T3FREE, THYROIDAB in the last 72 hours. Anemia Panel: No results for input(s): VITAMINB12, FOLATE, FERRITIN, TIBC, IRON, RETICCTPCT in the last 72 hours. Urine analysis:    Component Value Date/Time   COLORURINE AMBER (A) 11/27/2016 2334   APPEARANCEUR CLEAR 11/27/2016 2334   LABSPEC 1.020 11/27/2016 2334   PHURINE 6.0  11/27/2016 2334   GLUCOSEU NEGATIVE 11/27/2016 2334   GLUCOSEU NEG mg/dL 12/27/2008 0118   HGBUR NEGATIVE 11/27/2016 2334   BILIRUBINUR NEGATIVE 11/27/2016 2334   KETONESUR NEGATIVE 11/27/2016 2334   PROTEINUR NEGATIVE 11/27/2016 2334   UROBILINOGEN 0.2 12/27/2008 0118   NITRITE NEGATIVE 11/27/2016 2334   LEUKOCYTESUR NEGATIVE 11/27/2016 2334   Sepsis Labs: Invalid input(s): PROCALCITONIN, LACTICIDVEN  Recent Results (from the past 240 hour(s))  Blood Culture (routine x 2)     Status: None (Preliminary result)   Collection Time: 11/27/16  7:40 PM  Result Value Ref Range Status   Specimen Description BLOOD BLOOD RIGHT FOREARM  Final   Special Requests   Final    BOTTLES DRAWN AEROBIC AND ANAEROBIC Blood Culture adequate volume   Culture   Final    NO GROWTH 4 DAYS Performed at [facility], [address] [place], [place] [address]    Report Status PENDING  Incomplete  Blood Culture (routine x 2)     Status: None (Preliminary result)   Collection Time: 11/27/16  8:00 PM  Result Value Ref Range Status   Specimen Description BLOOD BLOOD RIGHT FOREARM  Final   Special Requests    Final    BOTTLES DRAWN AEROBIC AND ANAEROBIC Blood Culture adequate volume   Culture   Final    NO GROWTH 4 DAYS Performed at [facility], [place] [address], [place], [place] [address]    Report Status PENDING  Incomplete  MRSA PCR Screening     Status: None   Collection Time: 11/28/16  1:10 AM  Result Value Ref Range Status   MRSA by PCR NEGATIVE NEGATIVE Final    Comment:        The GeneXpert MRSA Assay (FDA approved for NASAL specimens only), is one component of a comprehensive MRSA colonization surveillance program. It is not intended to diagnose MRSA infection nor to guide or monitor treatment for MRSA infections.   Respiratory Panel by PCR     Status: None   Collection Time: 11/28/16  5:30 AM  Result Value Ref Range Status   Adenovirus NOT DETECTED NOT DETECTED Final   Coronavirus 229E NOT DETECTED NOT DETECTED Final   Coronavirus HKU1 NOT DETECTED NOT DETECTED Final   Coronavirus NL63 NOT DETECTED NOT DETECTED Final   Coronavirus OC43 NOT DETECTED NOT DETECTED Final   Metapneumovirus NOT DETECTED NOT DETECTED Final   Rhinovirus / Enterovirus NOT DETECTED NOT DETECTED Final   Influenza A NOT DETECTED NOT DETECTED Final   Influenza B NOT DETECTED NOT DETECTED Final   Parainfluenza Virus 1 NOT DETECTED NOT DETECTED Final   Parainfluenza Virus 2 NOT DETECTED NOT DETECTED Final   Parainfluenza Virus 3 NOT DETECTED NOT DETECTED Final   Parainfluenza Virus 4 NOT DETECTED NOT DETECTED Final   Respiratory Syncytial Virus NOT DETECTED NOT DETECTED Final   Bordetella pertussis NOT DETECTED NOT DETECTED Final   Chlamydophila pneumoniae NOT DETECTED NOT DETECTED Final   Mycoplasma pneumoniae NOT DETECTED NOT DETECTED Final    Comment: Performed at [facility], [place] [address], [place], [place] [address]      Radiology Studies: No results found.   Scheduled Meds: . amoxicillin-clavulanate  1 tablet Oral TID  . aspirin EC  81 mg Oral Daily  . diclofenac sodium   2 g Topical QID  . enoxaparin (LOVENOX) injection  40 mg Subcutaneous Q24H  . feeding supplement (ENSURE ENLIVE)  237 mL Oral TID BM  .  mirtazapine  15 mg Oral QHS  . polyethylene glycol  17 g Oral Daily  . senna-docusate  1 tablet Oral BID  . simvastatin  40 mg Oral QHS   Continuous Infusions: . 0.9 % NaCl with KCl 40 mEq / L     Author:  Berle Mull, MD [facility] Pager: [phone] 12/02/2016 4:23 PM     If 7PM-7AM, please contact night-coverage [email or web address] Password [email or web address] 12/02/2016, 4:23 PM

## 2016-12-02 NOTE — Progress Notes (Signed)
**  Preliminary report by tech**  Left upper extremity venous duplex complete. There is evidence of acute deep vein thrombosis involving the brachial vein of the left upper extremity.  There is also evidence of superficial vein thrombosis involving the basilic, and cephalic veins of the left upper extremity. Results were given to the patient's nurse, Isabella.  12/02/16 4:29 PM Carlos Levering RVT

## 2016-12-02 NOTE — Progress Notes (Signed)
ANTICOAGULATION CONSULT NOTE - Initial Consult  Pharmacy Consult for LMWH Indication: DVT  Allergies  Allergen Reactions  . Ace Inhibitors Swelling    ANDIOEDEMA  . Fosinopril Sodium Swelling    ANGIOEDEMA [ACE INHIBITOR]    Patient Measurements: Height: 6' (182.9 cm) Weight: 175 lb (79.4 kg) IBW/kg (Calculated) : 77.6  Vital Signs: Temp: 97.8 F (36.6 C) (08/30 1400) Temp Source: Oral (08/30 1400) BP: 88/50 (08/30 1446) Pulse Rate: 99 (08/30 1400)  Labs:  Recent Labs  11/30/16 0324 12/01/16 0350 12/02/16 0418  HGB 8.7* 8.4* 9.6*  HCT 26.8* 26.3* 30.4*  PLT 476* 555* 442*  CREATININE 0.65 0.62 0.69    Estimated Creatinine Clearance: 82.2 mL/min (by C-G formula based on SCr of 0.69 mg/dL).   Medical History: Past Medical History:  Diagnosis Date  . CHF 3151   Systolic failure. Cardiac cath in 2004: The LV showed mild global hypokinesia. EF of 45-50%. LVEDP was 12 mmHg. Left main was long which was patent. LAD has 10% distal stenosis. The vessel proximally was very tortuous. Diagonal one was small which was patent. Left circumflex was patent proximally and in mid portion and then tapers down in AV groove after giving off OM-2. OM-1 was large which was p  . Chronic venous insufficiency   . Erectile dysfunction   . Hyperlipidemia   . Hypertension   . Osteoarthritis    both knees   . Seborrheic keratoses    Multiple. Sees Lyndle Herrlich  . Seizures ([place]) 1999   related to alcohol abuse    . Squamous cell cancer of skin of left shoulder Sept 2014   Removal by [facility] Quale with clear margins    Medications:  Prescriptions Prior to Admission  Medication Sig Dispense Refill Last Dose  . acetaminophen (TYLENOL) 500 MG tablet Take 500 mg by mouth every 6 (six) hours as needed (for pain.).   11/26/2016 at Unknown time  . amLODipine (NORVASC) 2.5 MG tablet Take 1 tablet (2.5 mg total) by mouth daily. 90 tablet 3 11/27/2016 at Unknown time  . aspirin EC  81 MG tablet Take 81 mg by mouth daily.    11/27/2016 at Unknown time  . diclofenac sodium (VOLTAREN) 1 % GEL APPLY 2 GRAMS TOPICALLY 4 TIMES A DAY 100 g 2 11/27/2016 at Unknown time  . metoprolol tartrate (LOPRESSOR) 25 MG tablet TAKE 1 TABLET BY MOUTH TWICE A DAY 180 tablet 3 11/27/2016 at 9am  . oxyCODONE-acetaminophen (PERCOCET/ROXICET) 5-325 MG tablet Take 1 tablet by mouth every 8 (eight) hours as needed for severe pain. 60 tablet 0 11/26/2016 at Unknown time  . simvastatin (ZOCOR) 40 MG tablet TAKE 1 TABLET BY MOUTH AT BEDTIME 90 tablet 3 11/26/2016 at Unknown time  . ibuprofen (ADVIL,MOTRIN) 800 MG tablet Take 1 tablet (800 mg total) by mouth every 8 (eight) hours as needed. (Patient not taking: Reported on 11/27/2016) 30 tablet 0 Not Taking at Unknown time  . potassium chloride SA (K-DUR,KLOR-CON) 20 MEQ tablet Take 1 tablet (20 mEq total) by mouth once. (Patient not taking: Reported on 11/27/2016) 7 tablet 0 Not Taking at Unknown time    Assessment: 81 yo M admitted with HCAP.  Pharmacy consulted to dose LMWH for newly diagnosed LUE DVT. He has been on LMWH 40 qday since 11/28/2016, last dose 8/30 at 0949 am.  Hg 8.1-9.6, PLTC elevated at 442.  Cr WNL.  8/30 : doppler LUE DVT as well as LUE superficial vein thrombosis.   Goal  of Therapy:  Anti-Xa level 0.6-1 units/ml 4hrs after LMWH dose given Monitor platelets by anticoagulation protocol: Yes   Plan:  LMWH 1 mg/kg sq q12h = LMWH 80 q12 CBC and creatinine q72 hrs - but currently pt has daily CBC and BMET per MD orders Could changed to LMWH 1.5 mg/kg/day = 120 mg sq q24 at discharge for convenience of once daily dosing  Eudelia Bunch, Pharm.D. [phone] 12/02/2016 4:46 PM

## 2016-12-02 NOTE — Progress Notes (Signed)
Compression wrap with ace bandage applied to LUE. Timothy Bradford A

## 2016-12-03 DIAGNOSIS — I5032 Chronic diastolic (congestive) heart failure: Secondary | ICD-10-CM | POA: Diagnosis not present

## 2016-12-03 DIAGNOSIS — M6281 Muscle weakness (generalized): Secondary | ICD-10-CM | POA: Diagnosis not present

## 2016-12-03 DIAGNOSIS — R509 Fever, unspecified: Secondary | ICD-10-CM | POA: Diagnosis not present

## 2016-12-03 DIAGNOSIS — J189 Pneumonia, unspecified organism: Secondary | ICD-10-CM | POA: Diagnosis not present

## 2016-12-03 DIAGNOSIS — R69 Illness, unspecified: Secondary | ICD-10-CM | POA: Diagnosis not present

## 2016-12-03 DIAGNOSIS — A419 Sepsis, unspecified organism: Secondary | ICD-10-CM | POA: Diagnosis not present

## 2016-12-03 DIAGNOSIS — Z87891 Personal history of nicotine dependence: Secondary | ICD-10-CM | POA: Diagnosis not present

## 2016-12-03 DIAGNOSIS — E43 Unspecified severe protein-calorie malnutrition: Secondary | ICD-10-CM | POA: Diagnosis not present

## 2016-12-03 DIAGNOSIS — R339 Retention of urine, unspecified: Secondary | ICD-10-CM | POA: Diagnosis not present

## 2016-12-03 DIAGNOSIS — K5900 Constipation, unspecified: Secondary | ICD-10-CM | POA: Diagnosis not present

## 2016-12-03 DIAGNOSIS — R410 Disorientation, unspecified: Secondary | ICD-10-CM | POA: Diagnosis not present

## 2016-12-03 DIAGNOSIS — I5040 Unspecified combined systolic (congestive) and diastolic (congestive) heart failure: Secondary | ICD-10-CM | POA: Diagnosis not present

## 2016-12-03 DIAGNOSIS — R531 Weakness: Secondary | ICD-10-CM | POA: Diagnosis not present

## 2016-12-03 DIAGNOSIS — C801 Malignant (primary) neoplasm, unspecified: Secondary | ICD-10-CM | POA: Diagnosis not present

## 2016-12-03 DIAGNOSIS — I872 Venous insufficiency (chronic) (peripheral): Secondary | ICD-10-CM | POA: Diagnosis not present

## 2016-12-03 DIAGNOSIS — D72829 Elevated white blood cell count, unspecified: Secondary | ICD-10-CM | POA: Diagnosis not present

## 2016-12-03 DIAGNOSIS — R4189 Other symptoms and signs involving cognitive functions and awareness: Secondary | ICD-10-CM | POA: Diagnosis not present

## 2016-12-03 DIAGNOSIS — M545 Low back pain: Secondary | ICD-10-CM | POA: Diagnosis not present

## 2016-12-03 DIAGNOSIS — R5381 Other malaise: Secondary | ICD-10-CM | POA: Diagnosis not present

## 2016-12-03 DIAGNOSIS — E785 Hyperlipidemia, unspecified: Secondary | ICD-10-CM | POA: Diagnosis not present

## 2016-12-03 DIAGNOSIS — I509 Heart failure, unspecified: Secondary | ICD-10-CM | POA: Diagnosis not present

## 2016-12-03 DIAGNOSIS — C773 Secondary and unspecified malignant neoplasm of axilla and upper limb lymph nodes: Secondary | ICD-10-CM | POA: Diagnosis not present

## 2016-12-03 DIAGNOSIS — I5020 Unspecified systolic (congestive) heart failure: Secondary | ICD-10-CM | POA: Diagnosis not present

## 2016-12-03 DIAGNOSIS — R262 Difficulty in walking, not elsewhere classified: Secondary | ICD-10-CM | POA: Diagnosis not present

## 2016-12-03 DIAGNOSIS — I82622 Acute embolism and thrombosis of deep veins of left upper extremity: Secondary | ICD-10-CM | POA: Diagnosis not present

## 2016-12-03 DIAGNOSIS — R2689 Other abnormalities of gait and mobility: Secondary | ICD-10-CM | POA: Diagnosis not present

## 2016-12-03 DIAGNOSIS — G9341 Metabolic encephalopathy: Secondary | ICD-10-CM | POA: Diagnosis not present

## 2016-12-03 DIAGNOSIS — R0989 Other specified symptoms and signs involving the circulatory and respiratory systems: Secondary | ICD-10-CM | POA: Diagnosis not present

## 2016-12-03 DIAGNOSIS — I10 Essential (primary) hypertension: Secondary | ICD-10-CM | POA: Diagnosis not present

## 2016-12-03 DIAGNOSIS — C44629 Squamous cell carcinoma of skin of left upper limb, including shoulder: Secondary | ICD-10-CM | POA: Diagnosis not present

## 2016-12-03 DIAGNOSIS — I110 Hypertensive heart disease with heart failure: Secondary | ICD-10-CM | POA: Diagnosis not present

## 2016-12-03 DIAGNOSIS — Z7982 Long term (current) use of aspirin: Secondary | ICD-10-CM | POA: Diagnosis not present

## 2016-12-03 DIAGNOSIS — J80 Acute respiratory distress syndrome: Secondary | ICD-10-CM | POA: Diagnosis not present

## 2016-12-03 DIAGNOSIS — D649 Anemia, unspecified: Secondary | ICD-10-CM | POA: Diagnosis not present

## 2016-12-03 DIAGNOSIS — C4492 Squamous cell carcinoma of skin, unspecified: Secondary | ICD-10-CM | POA: Diagnosis not present

## 2016-12-03 DIAGNOSIS — E46 Unspecified protein-calorie malnutrition: Secondary | ICD-10-CM | POA: Diagnosis not present

## 2016-12-03 DIAGNOSIS — Z79899 Other long term (current) drug therapy: Secondary | ICD-10-CM | POA: Diagnosis not present

## 2016-12-03 DIAGNOSIS — F339 Major depressive disorder, recurrent, unspecified: Secondary | ICD-10-CM | POA: Diagnosis not present

## 2016-12-03 DIAGNOSIS — I82409 Acute embolism and thrombosis of unspecified deep veins of unspecified lower extremity: Secondary | ICD-10-CM | POA: Diagnosis present

## 2016-12-03 DIAGNOSIS — M79622 Pain in left upper arm: Secondary | ICD-10-CM | POA: Diagnosis not present

## 2016-12-03 LAB — BASIC METABOLIC PANEL
ANION GAP: 6 (ref 5–15)
BUN: 18 mg/dL (ref 6–20)
CALCIUM: 8.9 mg/dL (ref 8.9–10.3)
CO2: 24 mmol/L (ref 22–32)
CREATININE: 0.62 mg/dL (ref 0.61–1.24)
Chloride: 108 mmol/L (ref 101–111)
Glucose, Bld: 108 mg/dL — ABNORMAL HIGH (ref 65–99)
Potassium: 4.3 mmol/L (ref 3.5–5.1)
SODIUM: 138 mmol/L (ref 135–145)

## 2016-12-03 LAB — CBC
HEMATOCRIT: 24.8 % — AB (ref 39.0–52.0)
Hemoglobin: 8.1 g/dL — ABNORMAL LOW (ref 13.0–17.0)
MCH: 26.4 pg (ref 26.0–34.0)
MCHC: 32.7 g/dL (ref 30.0–36.0)
MCV: 80.8 fL (ref 78.0–100.0)
PLATELETS: 600 10*3/uL — AB (ref 150–400)
RBC: 3.07 MIL/uL — ABNORMAL LOW (ref 4.22–5.81)
RDW: 17.2 % — AB (ref 11.5–15.5)
WBC: 24.8 10*3/uL — AB (ref 4.0–10.5)

## 2016-12-03 LAB — CULTURE, BLOOD (ROUTINE X 2)
Culture: NO GROWTH
Culture: NO GROWTH
SPECIAL REQUESTS: ADEQUATE
Special Requests: ADEQUATE

## 2016-12-03 LAB — MAGNESIUM: MAGNESIUM: 2 mg/dL (ref 1.7–2.4)

## 2016-12-03 LAB — LACTIC ACID, PLASMA: LACTIC ACID, VENOUS: 2.4 mmol/L — AB (ref 0.5–1.9)

## 2016-12-03 MED ORDER — RIVAROXABAN 15 MG PO TABS: 15.0000 mg | ORAL_TABLET | Freq: Two times a day (BID) | ORAL | Status: DC

## 2016-12-03 MED ORDER — RIVAROXABAN (XARELTO) VTE STARTER PACK (15 & 20 MG): ORAL_TABLET | ORAL | 0 refills | Status: AC

## 2016-12-03 NOTE — Progress Notes (Addendum)
D/C Summary sent via [facility] informed of d/c. Ready for patient.  Nurse call report to: [phone] [facility] called for transport.   Kathrin Greathouse, Latanya Presser, MSW Clinical Social Worker 5E and Psychiatric Service Line [phone] 12/03/2016  10:36 AM

## 2016-12-03 NOTE — Progress Notes (Signed)
[facility] for enoxaparin to Xarelto  Indication: DVT  Allergies  Allergen Reactions  . Ace Inhibitors Swelling    ANDIOEDEMA  . Fosinopril Sodium Swelling    ANGIOEDEMA [ACE INHIBITOR]    Patient Measurements: Height: 6' (182.9 cm) Weight: 175 lb (79.4 kg) IBW/kg (Calculated) : 77.6 Heparin Dosing Weight:   Vital Signs: Temp: 99.8 F (37.7 C) (08/31 0441) Temp Source: Oral (08/31 0441) BP: 115/74 (08/31 0441) Pulse Rate: 117 (08/31 0441)  Labs:  Recent Labs  12/01/16 0350 12/02/16 0418 12/03/16 0431  HGB 8.4* 9.6* 8.1*  HCT 26.3* 30.4* 24.8*  PLT 555* 442* 600*  CREATININE 0.62 0.69 0.62    Estimated Creatinine Clearance: 82.2 mL/min (by C-G formula based on SCr of 0.62 mg/dL).   Medical History: Past Medical History:  Diagnosis Date  . CHF 3704   Systolic failure. Cardiac cath in 2004: The LV showed mild global hypokinesia. EF of 45-50%. LVEDP was 12 mmHg. Left main was long which was patent. LAD has 10% distal stenosis. The vessel proximally was very tortuous. Diagonal one was small which was patent. Left circumflex was patent proximally and in mid portion and then tapers down in AV groove after giving off OM-2. OM-1 was large which was p  . Chronic venous insufficiency   . Erectile dysfunction   . Hyperlipidemia   . Hypertension   . Osteoarthritis    both knees   . Seborrheic keratoses    Multiple. Sees Lyndle Herrlich  . Seizures ([place]) 1999   related to alcohol abuse    . Squamous cell cancer of skin of left shoulder Sept 2014   Removal by [facility]vel Quale with clear margins    Medications:  Scheduled:  . amoxicillin-clavulanate  1 tablet Oral TID  . aspirin EC  81 mg Oral Daily  . diclofenac sodium  2 g Topical QID  . enoxaparin (LOVENOX) injection  1 mg/kg Subcutaneous Q12H  . feeding supplement (ENSURE ENLIVE)  237 mL Oral TID BM  . mirtazapine  15 mg Oral QHS  . polyethylene glycol  17 g Oral  Daily  . senna-docusate  1 tablet Oral BID  . simvastatin  40 mg Oral QHS    Assessment: Pharmacy is consulted to dose Xarelto in 81 yo male who was being treated for upper extremity DVT. Pt was previously on enoxaparin.  Pt SCr 0.62, CrCl 82 ml/min    Goal of Therapy:  Monitor platelets by anticoagulation protocol: Yes   Plan:  Xarelto 15 mg PO BID with meals x 21 days, then 20 mg PO daily  Monitor for signs and symptoms of bleeding    Royetta Asal, PharmD, BCPS Pager [phone] 12/03/2016 10:46 AM

## 2016-12-03 NOTE — Discharge Summary (Signed)
[facility] Discharge Summary   Patient: Timothy Bradford EXH:[id number]   PCP: Lars Mage, MD DOB: March 11, 1936   Date of admission: 11/27/2016   Date of discharge:  12/03/2016    Discharge Diagnoses:  Principal Problem:   HCAP (healthcare-associated pneumonia) Active Problems:   Dyslipidemia   Essential hypertension   Chronic diastolic congestive heart failure ([place])   Chronic venous insufficiency   Sepsis ([place])   Acute metabolic encephalopathy   Acute urinary retention   Protein-calorie malnutrition, severe   Pressure injury of skin   DVT (deep venous thrombosis) ([place])  Admitted From: home Disposition:  SNF  Recommendations for Outpatient Follow-up:  1. Please follow up with PCP in 1 week    Contact information for follow-up providers    Chundi, Verne Spurr, MD. Schedule an appointment as soon as possible for a visit in 1 week(s).   Specialty:  Internal Medicine Contact information: [place] [address] [phone]        Nicholas Lose, MD. Schedule an appointment as soon as possible for a visit in 2 week(s).   Specialty:  Hematology and Oncology Contact information: [place] [address] [phone]        palliaitve care consult. Schedule an appointment as soon as possible for a visit in 1 week(s).   Why:  continue discussion about goals of care.            Contact information for after-discharge care    [facility] Follow up.   Specialty:  Skilled Nursing Facility Contact information: [address] [place] [phone]                 Diet recommendation: regular diet  Activity: The patient is advised to gradually reintroduce usual activities.  Discharge Condition: good  Code Status: full code  History of present illness: As per the H and P dictated on admission, "Timothy Bradford is a 81 y.o. 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  Per patient's wife, patient has been lethargic, mildly confused in the past 4 days. He has fever, chills and cough. No SOB. Patient had left sided chest pain earlier, which has resolved. No sputum production or coughing. No runny nose or sore throat. Patient does not have nausea, vomiting, diarrhea, abdominal pain, symptoms of UTI. No unilateral weakness. Patient had difficulty urinating. Bladder scan done and had a residual of 500. Foley catheter will be placed.  Patient is a teaching service patient , but needs to be in stepdown bed  ED Course: pt was found to have WBC 17.5, lactic acid 2.54, 1.11, pending urinalysis, electrolytes renal function okay, temperature 102.5, tachycardia, tachypnea, oxygen saturation 96% on room air, blood pressure 90/56, chest x-ray showed basilar opacity for possible bronchopneumonia"  Hospital Course:  Summary of his active problems in the hospital is as following. Sepsis due to HCAP -Patient meets criteria for sepsis with fever, tachycardia, chest x-ray with evidence of pneumonia -Patient started on broad-spectrum antibiotics with cefepime and azithromycin, vancomycin. Transition to Augmentin. -Blood cultures obtained in the emergency room, still without growth today -Respiratory panel negative -WBC better, no diarrhea/no new symptoms. Continue augmentin.   Chronic diastolic CHF -Most recent 2D echo was in 2016 had an EF of 50-55% with grade 1 diastolic dysfunction.  Appears euvolemic  right now, will closely monitor fluid status -Patient improving, sepsis physiology resolved, he is alert, eating.  Acute urinary retention -Possibly due to sepsis/encephalopathy, UA without evidence of infection. Voiding after removal of the catheter. Monitor.  Acute  encephalopathy -Likely in the setting of #1,  - better - discussed at length with patient's wife at bedside 8/27, he has been having intermittent memory problems for several weeks if not months.  Suspect early underlying dementia  Hypertension -Hold amlodipine for now.  Blood pressure stable.   Hyperlipidemia -Zocor   Metastatic squamous cell carcinoma and left axillary mass Goals of care discussion  -Status post surgical removal as well as radiation in the past, with recurrence, considered currently for treatment.  Followed as an outpatient by Dr. Lindi Adie Pain control is difficult. Percocet drops blood pressure and cause confusion,  Will try norco and diclofenac cream .  lidocaine not covered by insurance.  Pt's cancer increased from 3 cm in march 2018 to 9 cm this admission. Pain is also worsening and has anorexia and DVT.  Recommendation from oncology was to consider hospice. I discussed with with his wife and pt in presence of her sister. Pt want to remain full code for now. MOST form given and explained.  Wife wants to discuss with other family members as they had some "bad experience" with hospice last time.  For now I recommended continue ongoing discussion with palliative care at the SNF and plan for hospice.   Upper extremity DVT Pt has not been moving his left arm due to pain from cancer.  Upper extremity has chronic swelling and Doppler this admission is positive for DVT Started on lovenox, family want to transition to PILLS. Will switch him to xarelto. Risk benefit explained.  No PE on ct Chest  Constipation. Poor by mouth intake. Protein calorie malnutrition. Continue nutritional supplementation. Adding stool softeners.  Added Remeron, megace not covered by insurance   All other chronic medical condition were stable during the hospitalization.  Patient was seen by physical therapy, who recommended SNF, which was arranged by Education officer, museum and case Freight forwarder. On the  day of the discharge the patient's vitals were stable, and no other acute medical condition were reported by patient. the patient was felt safe to be discharge at SNF with tehrapy.  Procedures and Results:  none   Consultations:  none  DISCHARGE MEDICATION: Current Discharge Medication List    START taking these medications   Details  amoxicillin-clavulanate (AUGMENTIN) 500-125 MG tablet Take 1 tablet (500 mg total) by mouth 3 (three) times daily. Qty: 15 tablet, Refills: 0    dextromethorphan-guaiFENesin (MUCINEX DM) 30-600 MG 12hr tablet Take 1 tablet by mouth 2 (two) times daily. Qty: 30 tablet, Refills: 0    feeding supplement, ENSURE ENLIVE, (ENSURE ENLIVE) LIQD Take 237 mLs by mouth 3 (three) times daily between meals. Qty: 237 mL, Refills: 12    HYDROcodone-acetaminophen (NORCO/VICODIN) 5-325 MG tablet Take 1 tablet by mouth every 6 (six) hours as needed for severe pain. Qty: 30 tablet, Refills: 0    mirtazapine (REMERON) 15 MG tablet Take 1 tablet (15 mg total) by mouth at bedtime. Qty: 30 tablet, Refills: 0    polyethylene glycol (MIRALAX / GLYCOLAX) packet Take 17 g by mouth daily. Qty: 14 each, Refills: 0    Rivaroxaban 15 & 20 MG TBPK Take as directed on package: Start with one 15mg  tablet by mouth twice a day with food. On Day 22, switch to one 20mg  tablet once  a day with food. Qty: 51 each, Refills: 0      CONTINUE these medications which have NOT CHANGED   Details  acetaminophen (TYLENOL) 500 MG tablet Take 500 mg by mouth every 6 (six) hours as needed (for pain.).    aspirin EC 81 MG tablet Take 81 mg by mouth daily.     diclofenac sodium (VOLTAREN) 1 % GEL APPLY 2 GRAMS TOPICALLY 4 TIMES A DAY Qty: 100 g, Refills: 2    simvastatin (ZOCOR) 40 MG tablet TAKE 1 TABLET BY MOUTH AT BEDTIME Qty: 90 tablet, Refills: 3    ibuprofen (ADVIL,MOTRIN) 800 MG tablet Take 1 tablet (800 mg total) by mouth every 8 (eight) hours as needed. Qty: 30 tablet, Refills: 0        STOP taking these medications     amLODipine (NORVASC) 2.5 MG tablet      metoprolol tartrate (LOPRESSOR) 25 MG tablet      oxyCODONE-acetaminophen (PERCOCET/ROXICET) 5-325 MG tablet      potassium chloride SA (K-DUR,KLOR-CON) 20 MEQ tablet        Allergies  Allergen Reactions  . Ace Inhibitors Swelling    ANDIOEDEMA  . Fosinopril Sodium Swelling    ANGIOEDEMA [ACE INHIBITOR]   Discharge Instructions    Diet - low sodium heart healthy    Complete by:  As directed    Diet general    Complete by:  As directed    Discharge instructions    Complete by:  As directed    It is important that you read following instructions as well as go over your medication list with RN to help you understand your care after this hospitalization.  Discharge Instructions: Please follow-up with PCP in one week  Please request your primary care physician to go over all Hospital Tests and Procedure/Radiological results at the follow up,  Please get all Hospital records sent to your PCP by signing hospital release before you go home.   Do not take more than prescribed Pain, Sleep and Anxiety Medications. You were cared for by a hospitalist during your hospital stay. If you have any questions about your discharge medications or the care you received while you were in the hospital after you are discharged, you can call the unit and ask to speak with the hospitalist on call if the hospitalist that took care of you is not available.  Once you are discharged, your primary care physician will handle any further medical issues. Please note that NO REFILLS for any discharge medications will be authorized once you are discharged, as it is imperative that you return to your primary care physician (or establish a relationship with a primary care physician if you do not have one) for your aftercare needs so that they can reassess your need for medications and monitor your lab values. You Must read complete  instructions/literature along with all the possible adverse reactions/side effects for all the Medicines you take and that have been prescribed to you. Take any new Medicines after you have completely understood and accept all the possible adverse reactions/side effects. Wear Seat belts while driving. If you have smoked or chewed Tobacco in the last 2 yrs please stop smoking and/or stop any Recreational drug use.   Discharge instructions    Complete by:  As directed    It is important that you read following instructions as well as go over your medication list with RN to help you understand your care after this hospitalization.  Discharge Instructions:  Please follow-up with PCP in one week  Please request your primary care physician to go over all Hospital Tests and Procedure/Radiological results at the follow up,  Please get all Hospital records sent to your PCP by signing hospital release before you go home.   Do not take more than prescribed Pain, Sleep and Anxiety Medications. You were cared for by a hospitalist during your hospital stay. If you have any questions about your discharge medications or the care you received while you were in the hospital after you are discharged, you can call the unit and ask to speak with the hospitalist on call if the hospitalist that took care of you is not available.  Once you are discharged, your primary care physician will handle any further medical issues. Please note that NO REFILLS for any discharge medications will be authorized once you are discharged, as it is imperative that you return to your primary care physician (or establish a relationship with a primary care physician if you do not have one) for your aftercare needs so that they can reassess your need for medications and monitor your lab values. You Must read complete instructions/literature along with all the possible adverse reactions/side effects for all the Medicines you take and that have  been prescribed to you. Take any new Medicines after you have completely understood and accept all the possible adverse reactions/side effects. Wear Seat belts while driving. If you have smoked or chewed Tobacco in the last 2 yrs please stop smoking and/or stop any Recreational drug use.   Increase activity slowly    Complete by:  As directed    Increase activity slowly    Complete by:  As directed      Discharge Exam: [facility] Weights   11/27/16 1935  Weight: 79.4 kg (175 lb)   Vitals:   12/02/16 2332 12/03/16 0441  BP: (!) 95/47 115/74  Pulse: (!) 108 (!) 117  Resp: 18 18  Temp: 99.6 F (37.6 C) 99.8 F (37.7 C)  SpO2: 99% 100%   General: Appear in mild distress, no Rash; Oral Mucosa moist. Cardiovascular: S1 and S2 Present, no Murmur, no JVD Respiratory: Bilateral Air entry present and basal Crackles, no wheezes Abdomen: Bowel Sound present, Soft and no tenderness Extremities: no Pedal edema, no calf tenderness Neurology: Grossly no focal neuro deficit.  The results of significant diagnostics from this hospitalization (including imaging, microbiology, ancillary and laboratory) are listed below for reference.    Significant Diagnostic Studies: Ct Angio Chest Pe W Or Wo Contrast  Result Date: 12/02/2016 CLINICAL DATA:  Metastatic squamous cell skin cancer LEFT shoulder post radiation therapy, cough, fever, question pneumonia, altered mental status question pulmonary embolism with high pretest probability EXAM: CT ANGIOGRAPHY CHEST WITH CONTRAST TECHNIQUE: Multidetector CT imaging of the chest was performed using the standard protocol during bolus administration of intravenous contrast. Multiplanar CT image reconstructions and MIPs were obtained to evaluate the vascular anatomy. CONTRAST:  100 cc Isovue 370 IV COMPARISON:  06/17/2016 FINDINGS: Cardiovascular: Aorta normal caliber with scattered atherosclerotic calcifications. No aortic dissection or aneurysmal dilatation. Coronary  artery calcifications. Assessment of pulmonary arteries is limited by respiratory motion artifacts particularly at lower lobes. Pulmonary arteries adequately opacified and grossly patent. No definite evidence of pulmonary embolism. Mediastinum/Nodes: Base of cervical region normal appearance. No definite mediastinal or hilar adenopathy. No RIGHT axillary adenopathy. Large soft tissue mass at LEFT axilla with invasion of the LEFT lateral chest wall compatible with tumor, measuring at least 9.8 x 7.5  x 11.1 cm markedly progressive since prior exam. Tumor extends between the LEFT second and third ribs into the LEFT pleural space with mild bone destruction of the lateral aspect of the LEFT second rib. Scattered surgical clips LEFT axilla again noted. Anterior displacement of the LEFT axillary vessels. Scattered soft tissue edema LEFT lateral chest wall. Lungs/Pleura: Severe emphysematous changes. Mild central peribronchial thickening. Extensive respiratory motion artifacts. Subsegmental atelectasis BILATERAL lower lobes and lingula. No definite pulmonary infiltrate, RIGHT pleural effusion or pneumothorax. Small LEFT pleural effusion present. Upper Abdomen: Low-attenuation foci within liver again identified compatible with known cysts. Previously identified hemangioma at the lateral segment LEFT lobe is not well localized. Remaining visualized upper abdomen unremarkable Musculoskeletal: No acute osseous findings. Review of the MIP images confirms the above findings. IMPRESSION: No gross evidence of pulmonary embolism with limitations of assessment of lower lobe pulmonary arteries bilaterally due to respiratory motion. Severe emphysematous changes with bibasilar atelectasis and small LEFT pleural effusion. Marked interval increase in size of LEFT axillary mass now penetrating the LEFT lateral chest wall into the LEFT pleural space, mass overall measuring at least 9.8 x 7.5 x 11.1 cm and associated with destruction of a  small portion of the lateral LEFT second rib. Coronary artery calcification. Aortic Atherosclerosis (ICD10-I70.0) and Emphysema (ICD10-J43.9). Electronically Signed   By: Lavonia Dana M.D.   On: 12/02/2016 19:40   Dg Chest Port 1 View  Result Date: 11/27/2016 CLINICAL DATA:  Fever EXAM: PORTABLE CHEST 1 VIEW COMPARISON:  10/13/2016 chest x-ray.  CT 06/24/2016. FINDINGS: Streaky bilateral lung opacities. There is pleural based density in the peripheral left chest, with overlying malignant appearing second and third rib erosions. Patient has history of axillary radiotherapy. Normal heart size and stable aortic tortuosity. Stable biapical pleural thickening. No edema, effusion, or pneumothorax. Emphysema by prior CT. Previous left axillary dissection. IMPRESSION: 1. Streaky opacities at the bases which could be atelectasis or bronchopneumonia. 2. History of squamous cell carcinoma in the left axilla with lateral left second and third rib erosions. Neighboring subpleural density could be from previous radiotherapy or tumor. 3. Emphysema. Electronically Signed   By: Monte Fantasia M.D.   On: 11/27/2016 19:34    Microbiology: Recent Results (from the past 240 hour(s))  Blood Culture (routine x 2)     Status: None (Preliminary result)   Collection Time: 11/27/16  7:40 PM  Result Value Ref Range Status   Specimen Description BLOOD BLOOD RIGHT FOREARM  Final   Special Requests   Final    BOTTLES DRAWN AEROBIC AND ANAEROBIC Blood Culture adequate volume   Culture   Final    NO GROWTH 4 DAYS Performed at [facility], [address], [place], [place] [address]    Report Status PENDING  Incomplete  Blood Culture (routine x 2)     Status: None (Preliminary result)   Collection Time: 11/27/16  8:00 PM  Result Value Ref Range Status   Specimen Description BLOOD BLOOD RIGHT FOREARM  Final   Special Requests   Final    BOTTLES DRAWN AEROBIC AND ANAEROBIC Blood Culture adequate volume   Culture   Final     NO GROWTH 4 DAYS Performed at [facility], [place] [address] [place], [place] [address]    Report Status PENDING  Incomplete  MRSA PCR Screening     Status: None   Collection Time: 11/28/16  1:10 AM  Result Value Ref Range Status   MRSA by PCR NEGATIVE NEGATIVE  Final    Comment:        The GeneXpert MRSA Assay (FDA approved for NASAL specimens only), is one component of a comprehensive MRSA colonization surveillance program. It is not intended to diagnose MRSA infection nor to guide or monitor treatment for MRSA infections.   Respiratory Panel by PCR     Status: None   Collection Time: 11/28/16  5:30 AM  Result Value Ref Range Status   Adenovirus NOT DETECTED NOT DETECTED Final   Coronavirus 229E NOT DETECTED NOT DETECTED Final   Coronavirus HKU1 NOT DETECTED NOT DETECTED Final   Coronavirus NL63 NOT DETECTED NOT DETECTED Final   Coronavirus OC43 NOT DETECTED NOT DETECTED Final   Metapneumovirus NOT DETECTED NOT DETECTED Final   Rhinovirus / Enterovirus NOT DETECTED NOT DETECTED Final   Influenza A NOT DETECTED NOT DETECTED Final   Influenza B NOT DETECTED NOT DETECTED Final   Parainfluenza Virus 1 NOT DETECTED NOT DETECTED Final   Parainfluenza Virus 2 NOT DETECTED NOT DETECTED Final   Parainfluenza Virus 3 NOT DETECTED NOT DETECTED Final   Parainfluenza Virus 4 NOT DETECTED NOT DETECTED Final   Respiratory Syncytial Virus NOT DETECTED NOT DETECTED Final   Bordetella pertussis NOT DETECTED NOT DETECTED Final   Chlamydophila pneumoniae NOT DETECTED NOT DETECTED Final   Mycoplasma pneumoniae NOT DETECTED NOT DETECTED Final    Comment: Performed at [facility], [place] [address] [place], [place] [address]     Labs: CBC:  Recent Labs Lab 11/27/16 1940 11/29/16 0309 11/30/16 0324 12/01/16 0350 12/02/16 0418 12/03/16 0431  WBC 17.5* 17.3* 23.3* 23.4* 19.2* 24.8*  NEUTROABS 14.3*  --   --   --   --   --   HGB 9.0* 8.1* 8.7* 8.4* 9.6* 8.1*  HCT 27.3*  25.5* 26.8* 26.3* 30.4* 24.8*  MCV 80.8 80.4 80.5 80.9 79.6 80.8  PLT 522* 465* 476* 555* 442* 967*   Basic Metabolic Panel:  Recent Labs Lab 11/28/16 1358 11/29/16 0309 11/30/16 0324 12/01/16 0350 12/02/16 0418 12/03/16 0431  NA 133* 135 135 135 134* 138  K 3.4* 3.3* 3.8 3.7 3.2* 4.3  CL 105 107 105 104 103 108  CO2 21* 20* 23 23 24 24   GLUCOSE 98 127* 133* 113* 109* 108*  BUN 9 8 11 12 17 18   CREATININE 0.62 0.58* 0.65 0.62 0.69 0.62  CALCIUM 8.4* 8.1* 8.7* 9.2 9.0 8.9  MG 1.8  --   --   --  2.0 2.0   Liver Function Tests:  Recent Labs Lab 11/27/16 1940 11/29/16 0309  AST 32 22  ALT 23 17  ALKPHOS 123 88  BILITOT 0.6 0.7  PROT 6.6 5.9*  ALBUMIN 2.1* 1.9*   No results for input(s): LIPASE, AMYLASE in the last 168 hours. No results for input(s): AMMONIA in the last 168 hours. Cardiac Enzymes: No results for input(s): CKTOTAL, CKMB, CKMBINDEX, TROPONINI in the last 168 hours. BNP (last 3 results)  Recent Labs  11/28/16 0154  BNP 151.6*   CBG:  Recent Labs Lab 11/29/16 0738  GLUCAP 106*   Time spent: 60 minutes  Signed:  Trent Gabler  [facility]  12/03/2016  , 9:34 AM

## 2016-12-09 DIAGNOSIS — R5381 Other malaise: Secondary | ICD-10-CM | POA: Diagnosis not present

## 2016-12-09 DIAGNOSIS — R262 Difficulty in walking, not elsewhere classified: Secondary | ICD-10-CM | POA: Diagnosis not present

## 2016-12-09 DIAGNOSIS — R4189 Other symptoms and signs involving cognitive functions and awareness: Secondary | ICD-10-CM | POA: Diagnosis not present

## 2016-12-09 DIAGNOSIS — M6281 Muscle weakness (generalized): Secondary | ICD-10-CM | POA: Diagnosis not present

## 2016-12-10 DIAGNOSIS — R509 Fever, unspecified: Secondary | ICD-10-CM | POA: Diagnosis not present

## 2016-12-10 DIAGNOSIS — J189 Pneumonia, unspecified organism: Secondary | ICD-10-CM | POA: Diagnosis not present

## 2016-12-10 DIAGNOSIS — I509 Heart failure, unspecified: Secondary | ICD-10-CM | POA: Diagnosis not present

## 2016-12-14 DIAGNOSIS — I82622 Acute embolism and thrombosis of deep veins of left upper extremity: Secondary | ICD-10-CM | POA: Diagnosis not present

## 2016-12-14 DIAGNOSIS — C44629 Squamous cell carcinoma of skin of left upper limb, including shoulder: Secondary | ICD-10-CM | POA: Diagnosis not present

## 2016-12-14 DIAGNOSIS — F339 Major depressive disorder, recurrent, unspecified: Secondary | ICD-10-CM | POA: Diagnosis not present

## 2016-12-14 DIAGNOSIS — D649 Anemia, unspecified: Secondary | ICD-10-CM | POA: Diagnosis not present

## 2016-12-15 ENCOUNTER — Telehealth: Payer: Self-pay | Admitting: *Deleted

## 2016-12-15 ENCOUNTER — Other Ambulatory Visit: Payer: Self-pay | Admitting: *Deleted

## 2016-12-15 NOTE — Telephone Encounter (Signed)
Received call from pt's wife stating that she wants to cancel appt for tomorrow since husband is in rehab.  Will cancel appt & message routed to Dr Rolla Plate RN

## 2016-12-15 NOTE — Patient Outreach (Signed)
[facility]) Care Management  12/15/2016  Timothy Bradford 1935-06-10 [id number]  Met with patient and daughter at facility.  Daughter reports patient wife just left facility and it would be best to call her. Timothy Bradford is patient wife, [phone]  Left [facility] brochure and RNCM contact. Will attempt to call wife later in week.   [name]. [name], RN, BSN, [place] [phone]) Business Cell  [phone]) Toll Free Office

## 2016-12-16 ENCOUNTER — Emergency Department (HOSPITAL_COMMUNITY)
Admission: EM | Admit: 2016-12-16 | Discharge: 2016-12-16 | Disposition: A | Discharge status: Skilled Nursing Facility | Payer: PPO | Attending: Emergency Medicine | Admitting: Emergency Medicine

## 2016-12-16 ENCOUNTER — Encounter (HOSPITAL_COMMUNITY): Payer: Self-pay | Admitting: Emergency Medicine

## 2016-12-16 ENCOUNTER — Ambulatory Visit: Payer: PPO | Admitting: Hematology and Oncology

## 2016-12-16 ENCOUNTER — Emergency Department (HOSPITAL_COMMUNITY): Payer: PPO

## 2016-12-16 DIAGNOSIS — Z7982 Long term (current) use of aspirin: Secondary | ICD-10-CM | POA: Diagnosis not present

## 2016-12-16 DIAGNOSIS — C801 Malignant (primary) neoplasm, unspecified: Secondary | ICD-10-CM

## 2016-12-16 DIAGNOSIS — I5032 Chronic diastolic (congestive) heart failure: Secondary | ICD-10-CM | POA: Insufficient documentation

## 2016-12-16 DIAGNOSIS — Z79899 Other long term (current) drug therapy: Secondary | ICD-10-CM | POA: Insufficient documentation

## 2016-12-16 DIAGNOSIS — Z87891 Personal history of nicotine dependence: Secondary | ICD-10-CM | POA: Insufficient documentation

## 2016-12-16 DIAGNOSIS — C4492 Squamous cell carcinoma of skin, unspecified: Secondary | ICD-10-CM | POA: Diagnosis not present

## 2016-12-16 DIAGNOSIS — D72829 Elevated white blood cell count, unspecified: Secondary | ICD-10-CM | POA: Diagnosis not present

## 2016-12-16 DIAGNOSIS — J189 Pneumonia, unspecified organism: Secondary | ICD-10-CM | POA: Diagnosis not present

## 2016-12-16 DIAGNOSIS — D649 Anemia, unspecified: Secondary | ICD-10-CM | POA: Diagnosis not present

## 2016-12-16 DIAGNOSIS — I5020 Unspecified systolic (congestive) heart failure: Secondary | ICD-10-CM | POA: Insufficient documentation

## 2016-12-16 DIAGNOSIS — I110 Hypertensive heart disease with heart failure: Secondary | ICD-10-CM | POA: Insufficient documentation

## 2016-12-16 DIAGNOSIS — C44629 Squamous cell carcinoma of skin of left upper limb, including shoulder: Secondary | ICD-10-CM | POA: Diagnosis not present

## 2016-12-16 DIAGNOSIS — R69 Illness, unspecified: Secondary | ICD-10-CM | POA: Insufficient documentation

## 2016-12-16 LAB — COMPREHENSIVE METABOLIC PANEL
ALBUMIN: 2 g/dL — AB (ref 3.5–5.0)
ALK PHOS: 81 U/L (ref 38–126)
ALT: 21 U/L (ref 17–63)
ANION GAP: 6 (ref 5–15)
AST: 31 U/L (ref 15–41)
BILIRUBIN TOTAL: 1 mg/dL (ref 0.3–1.2)
BUN: 10 mg/dL (ref 6–20)
CALCIUM: 8.8 mg/dL — AB (ref 8.9–10.3)
CHLORIDE: 102 mmol/L (ref 101–111)
CO2: 29 mmol/L (ref 22–32)
Creatinine, Ser: 0.67 mg/dL (ref 0.61–1.24)
GFR calc Af Amer: >60 mL/min (ref >60)
GFR calc non Af Amer: >60 mL/min (ref >60)
Glucose, Bld: 86 mg/dL (ref 65–99)
Potassium: 3.7 mmol/L (ref 3.5–5.1)
SODIUM: 137 mmol/L (ref 135–145)
Total Protein: 7.1 g/dL (ref 6.5–8.1)

## 2016-12-16 LAB — CBC WITH DIFFERENTIAL/PLATELET
Basophils Absolute: 0 10*3/uL (ref 0.0–0.1)
Basophils Relative: 0 %
EOS ABS: 0.1 10*3/uL (ref 0.0–0.7)
Eosinophils Relative: 0 %
HEMATOCRIT: 27.6 % — AB (ref 39.0–52.0)
HEMOGLOBIN: 8.7 g/dL — AB (ref 13.0–17.0)
LYMPHS ABS: 1.6 10*3/uL (ref 0.7–4.0)
Lymphocytes Relative: 10 %
MCH: 26.8 pg (ref 26.0–34.0)
MCHC: 31.5 g/dL (ref 30.0–36.0)
MCV: 84.9 fL (ref 78.0–100.0)
MONOS PCT: 14 %
Monocytes Absolute: 2.2 10*3/uL — ABNORMAL HIGH (ref 0.1–1.0)
NEUTROS PCT: 76 %
Neutro Abs: 11.8 10*3/uL — ABNORMAL HIGH (ref 1.7–7.7)
Platelets: 577 10*3/uL — ABNORMAL HIGH (ref 150–400)
RBC: 3.25 MIL/uL — ABNORMAL LOW (ref 4.22–5.81)
RDW: 19.6 % — ABNORMAL HIGH (ref 11.5–15.5)
WBC: 15.8 10*3/uL — ABNORMAL HIGH (ref 4.0–10.5)

## 2016-12-16 LAB — TYPE AND SCREEN
ABO/RH(D): B POS
ANTIBODY SCREEN: NEGATIVE

## 2016-12-16 LAB — I-STAT TROPONIN, ED: TROPONIN I, POC: 0.01 ng/mL (ref 0.00–0.08)

## 2016-12-16 LAB — ABO/RH: ABO/RH(D): B POS

## 2016-12-16 LAB — PROTIME-INR
INR: 3.79
Prothrombin Time: 37.1 seconds — ABNORMAL HIGH (ref 11.4–15.2)

## 2016-12-16 LAB — BRAIN NATRIURETIC PEPTIDE: B Natriuretic Peptide: 145.1 pg/mL — ABNORMAL HIGH (ref 0.0–100.0)

## 2016-12-16 LAB — I-STAT CG4 LACTIC ACID, ED: Lactic Acid, Venous: 1.92 mmol/L — ABNORMAL HIGH (ref 0.5–1.9)

## 2016-12-16 NOTE — ED Notes (Signed)
A call placed to [facility] doe transportation back to [place].

## 2016-12-16 NOTE — ED Triage Notes (Addendum)
Per [facility], pt from [facility] healthcare. Recent labs showed hgb of 6.9  and cxr showed pneumonia. Pt has no complaints at this time. Pt is CAO x3.

## 2016-12-16 NOTE — ED Notes (Signed)
Report given to Janett Billow at [facility]

## 2016-12-16 NOTE — Discharge Instructions (Signed)
1. Hemoglobin was assessed in the emergency department. At this time value is 8.7 mg/dl. This is stable as compared to prior values from his hospitalization at the end of August.At this time, patient does not need urgent blood transfusion. Continue to monitor for signs of weakness, bleeding or other concerning symptoms of anemia.  2. Patient's chest x-ray was evaluated. Radiologist does not identify any change or worsening in appearance since 8/30. Patient does not report any increasing problems with shortness of breath, cough or chest pain. Continue to observe the patient and have reassessment by primary care provider within the next several days.  3. Patient has persistent leukocytosis, this however has decreased since the time of his hospitalization.  4. Multiple chronic conditions are reidentified. If the patient seems to have general worsening of his condition or identifies other symptoms, return to the emergency department for reassessment.

## 2016-12-16 NOTE — ED Provider Notes (Signed)
[facility] DEPT Provider Note   CSN: [id number] Arrival date & time: 12/16/16  1150     History   Chief Complaint Chief Complaint  Patient presents with  . Anemia  . Pneumonia    HPI Eithen Castiglia is a 81 y.o. male.  HPI Patient has past medical history significant for metastatic squamous cell skin cancer. Patient had been hospitalized at the end of August with HCAP. He was discharged and has just completed his antibiotics. [facility] had evaluated the patient with labs and chest x-ray. Reportedly hemoglobin was 6.9 and chest x-ray showed pneumonia. He is sent for further evaluation and anticipated blood transfusion. The patient denies he has had any change in symptoms. He has not had any increase in chest pain or cough. He does not recall any fever that has been documented. He reports he does not feel much different. He chronically has swelling and pain in his left arm from his tumor and a DVT. He does not identify change. He has not developed vomiting or abdominal pain. Past Medical History:  Diagnosis Date  . CHF 4098   Systolic failure. Cardiac cath in 2004: The LV showed mild global hypokinesia. EF of 45-50%. LVEDP was 12 mmHg. Left main was long which was patent. LAD has 10% distal stenosis. The vessel proximally was very tortuous. Diagonal one was small which was patent. Left circumflex was patent proximally and in mid portion and then tapers down in AV groove after giving off OM-2. OM-1 was large which was p  . Chronic venous insufficiency   . Erectile dysfunction   . Hyperlipidemia   . Hypertension   . Osteoarthritis    both knees   . Seborrheic keratoses    Multiple. Sees Lyndle Herrlich  . Seizures ([place]) 1999   related to alcohol abuse    . Squamous cell cancer of skin of left shoulder Sept 2014   Removal by [facility] Harvel Quale with clear margins    Patient Active Problem List   Diagnosis Date Noted  . DVT (deep venous thrombosis) ([place]) 12/03/2016   . Pressure injury of skin 12/01/2016  . Protein-calorie malnutrition, severe 11/29/2016  . Sepsis ([place]) 11/27/2016  . HCAP (healthcare-associated pneumonia) 11/27/2016  . Acute metabolic encephalopathy 11/91/4782  . Acute urinary retention 11/27/2016  . Left shoulder pain 05/28/2016  . Need for immunization against influenza 02/25/2016  . Advance care planning 02/23/2016  . Metastatic squamous cell carcinoma ([place]) 06/20/2015  . Encounter for immunization 12/27/2014  . Chronic venous insufficiency   . H/O hyperglycemia 04/10/2014  . Gout 07/25/2013  . Swelling of joint, wrist, left 07/13/2013  . Seborrheic keratoses, inflamed 10/26/2012  . Health care maintenance 10/26/2012  . Chronic diastolic congestive heart failure ([place]) 12/26/2008  . OSTEOARTHRITIS 05/27/2006  . Dyslipidemia 04/26/2006  . ERECTILE DYSFUNCTION 02/09/2006  . Essential hypertension 02/09/2006    Past Surgical History:  Procedure Laterality Date  . AXILLARY LYMPH NODE DISSECTION Left 08/12/2015  . AXILLARY LYMPH NODE DISSECTION Left 08/12/2015   Procedure: LEFT AXILLARY LYMPH NODE DISSECTION;  Surgeon: Erroll Luna, MD;  Location: [facility];  Service: General;  Laterality: Left;  . CATARACT EXTRACTION W/ INTRAOCULAR LENS  IMPLANT, BILATERAL Bilateral 2016  . EYE SURGERY     bilateral cataracts  . HERNIA REPAIR    . MASS EXCISION Left 08/05/2016   Procedure: EXCISION LEFT AXILLARY MASS;  Surgeon: Erroll Luna, MD;  Location: [facility];  Service: General;  Laterality: Left;  . SKIN CANCER EXCISION  left shoulder 3 yrs ago       Home Medications    Prior to Admission medications   Medication Sig Start Date End Date Taking? Authorizing Provider  acetaminophen (TYLENOL) 500 MG tablet Take 500 mg by mouth every 6 (six) hours as needed (for pain.).   Yes [provider]  Amino Acids-Protein Hydrolys (FEEDING SUPPLEMENT, PRO-STAT SUGAR FREE 64,) LIQD Take 30 mLs by mouth 3 (three) times daily with meals.    Yes [provider]  aspirin EC 81 MG tablet Take 81 mg by mouth daily with breakfast.    Yes [provider]  dextromethorphan-guaiFENesin (MUCINEX DM) 30-600 MG 12hr tablet Take 1 tablet by mouth 2 (two) times daily. 12/02/16  Yes Lavina Hamman, MD  diclofenac sodium (VOLTAREN) 1 % GEL APPLY 2 GRAMS TOPICALLY 4 TIMES A DAY 08/23/16  Yes Riccardo Dubin, MD  feeding supplement, ENSURE ENLIVE, (ENSURE ENLIVE) LIQD Take 237 mLs by mouth 3 (three) times daily between meals. 12/02/16  Yes Lavina Hamman, MD  HYDROcodone-acetaminophen (NORCO/VICODIN) 5-325 MG tablet Take 1 tablet by mouth every 6 (six) hours as needed for severe pain. 12/02/16  Yes Lavina Hamman, MD  ibuprofen (ADVIL,MOTRIN) 200 MG tablet Take 800 mg by mouth every 8 (eight) hours as needed for fever, headache, mild pain, moderate pain or cramping.   Yes [provider]  Infant Care Products (DERMACLOUD) CREA Apply 1 application topically 2 (two) times daily. Apply to penial shaft   Yes [provider]  mirtazapine (REMERON) 15 MG tablet Take 1 tablet (15 mg total) by mouth at bedtime. 12/02/16 12/02/17 Yes Lavina Hamman, MD  polyethylene glycol (MIRALAX / Floria Raveling) packet Take 17 g by mouth daily. 12/03/16  Yes Lavina Hamman, MD  Rivaroxaban 15 & 20 MG TBPK Take as directed on package: Start with one 15mg  tablet by mouth twice a day with food. On Day 22, switch to one 20mg  tablet once a day with food. 12/03/16  Yes Lavina Hamman, MD  simvastatin (ZOCOR) 40 MG tablet TAKE 1 TABLET BY MOUTH AT BEDTIME 07/05/16  Yes Riccardo Dubin, MD  ibuprofen (ADVIL,MOTRIN) 800 MG tablet Take 1 tablet (800 mg total) by mouth every 8 (eight) hours as needed. Patient not taking: Reported on 11/27/2016 08/05/16   Erroll Luna, MD    Family History Family History  Problem Relation Age of Onset  . Stroke Maternal Grandmother     Social History Social History  Substance Use Topics  . Smoking status: Former Smoker     Packs/day: 1.50    Years: 4.50    Types: Cigarettes    Quit date: 03/16/1988  . Smokeless tobacco: Never Used  . Alcohol use No     Comment: Quit 1999, h/o DT's and szs. No ETOH since 1999     Allergies   Ace inhibitors and Fosinopril sodium   Review of Systems Review of Systems  10 Systems reviewed and are negative for acute change except as noted in the HPI.  Physical Exam Updated Vital Signs BP (!) 104/56 (BP Location: Right Arm)   Pulse 97   Temp 98.1 F (36.7 C) (Oral)   Resp (!) 21   SpO2 97%   Physical Exam  Constitutional: He appears well-developed and well-nourished. No distress.  Patient is alert and nontoxic without respiratory distress.  HENT:  Head: Normocephalic and atraumatic.  Mouth/Throat: Oropharynx is clear and moist.  Eyes: EOM are normal.  Cardiovascular: Normal rate and  regular rhythm.   Pulmonary/Chest:  The patient is not exhibiting respiratory distress. Breath sounds are soft at the bases. No gross wheezes or rhonchi.  Abdominal: Soft. He exhibits no distension. There is no tenderness. There is no guarding.  Musculoskeletal:  Moderate swelling of the left upper extremity. This is diffuse. Mild lower extremity swelling.  Neurological: He is alert. No cranial nerve deficit. He exhibits normal muscle tone. Coordination normal.  Skin: Skin is warm and dry.  Psychiatric: He has a normal mood and affect.     ED Treatments / Results  Labs (all labs ordered are listed, but only abnormal results are displayed) Labs Reviewed  COMPREHENSIVE METABOLIC PANEL - Abnormal; Notable for the following:       Result Value   Calcium 8.8 (*)    Albumin 2.0 (*)    All other components within normal limits  BRAIN NATRIURETIC PEPTIDE - Abnormal; Notable for the following:    B Natriuretic Peptide 145.1 (*)    All other components within normal limits  CBC WITH DIFFERENTIAL/PLATELET - Abnormal; Notable for the following:    WBC 15.8 (*)    RBC 3.25 (*)     Hemoglobin 8.7 (*)    HCT 27.6 (*)    RDW 19.6 (*)    Platelets 577 (*)    Neutro Abs 11.8 (*)    Monocytes Absolute 2.2 (*)    All other components within normal limits  PROTIME-INR - Abnormal; Notable for the following:    Prothrombin Time 37.1 (*)    All other components within normal limits  I-STAT CG4 LACTIC ACID, ED - Abnormal; Notable for the following:    Lactic Acid, Venous 1.92 (*)    All other components within normal limits  CULTURE, BLOOD (ROUTINE X 2)  CULTURE, BLOOD (ROUTINE X 2)  URINALYSIS, ROUTINE W REFLEX MICROSCOPIC  I-STAT TROPONIN, ED  POC OCCULT BLOOD, ED  TYPE AND SCREEN  ABO/RH    EKG  EKG Interpretation None       Radiology Dg Chest Port 1 View  Result Date: 12/16/2016 CLINICAL DATA:  Follow-up pneumonia EXAM: PORTABLE CHEST 1 VIEW COMPARISON:  11/27/2016, 12/02/16 FINDINGS: Increased soft tissue density is noted in the region of the left axilla consistent with patient's known underlying mass lesion. Pleural involvement and bony destruction in the left lateral chest wall is again noted. Cardiac shadow is enlarged. Aortic calcifications are again seen. Some patchy left basilar infiltrate is again noted and stable. No pneumothorax is seen. No sizable effusion is noted. IMPRESSION: Stable left basilar changes. Changes in the region of the left axilla and left chest wall stable from the prior exam. Electronically Signed   By: Inez Catalina M.D.   On: 12/16/2016 13:41    Procedures Procedures (including critical care time)  Medications Ordered in ED Medications - No data to display   Initial Impression / Assessment and Plan / ED Course  I have reviewed the triage vital signs and the nursing notes.  Pertinent labs & imaging results that were available during my care of the patient were reviewed by me and considered in my medical decision making (see chart for details).     Final Clinical Impressions(s) / ED Diagnoses   Final diagnoses:  Severe  comorbid illness  Cancer ([place])  Leukocytosis, unspecified type  Patient was evaluated for anemia. At this time hemoglobin is stable as compared to prior values during his hospitalization. Patient is not symptomatic with increased general weakness or dyspnea per  his report. Also there was concern of pneumonia on chest x-ray. Today's chest x-ray done through the emergency department is stable as compared to previous. At this time, without changes in diagnostic evaluation and patient reporting no symptomatic deterioration, I do feel patient stable to return to nursing home care. He and his family murmurs are counseled on necessity to return if patient is developing new symptoms or feeling worse.  New Prescriptions New Prescriptions   No medications on file     Charlesetta Shanks, MD 12/16/16 1740

## 2016-12-16 NOTE — ED Notes (Signed)
Bed: UO15 Expected date:  Expected time:  Means of arrival:  Comments: EMS 81 y/o HGB 2.7; Room 12

## 2016-12-17 ENCOUNTER — Other Ambulatory Visit: Payer: Self-pay | Admitting: *Deleted

## 2016-12-17 NOTE — Patient Outreach (Signed)
Outreach call to patient wife, Huston Foley. She reports patient is working hard to come home around day 21. They worry about the copay days.  She states her daughter has filled out paperwork for Medicaid to help with copay days if needed.   RNCM reviewed [facility] care management program. Wife states they may be interested but she is not sure when patient will discharge.  She voices main concern about financial issues and help at home.   Plan to follow up with wife upon return to facility.  Wife has RNCM contact and will keep it and call RNCM if needs arise before next outreach attempt.  This RNCM will let Kristy at facility know that if patient discharged before this RNCM return to facility, she can make [facility] referral.   [name]. [name], RN, BSN, [place] [phone]) Business Cell  [phone]) Toll Free Office

## 2016-12-20 DIAGNOSIS — I5032 Chronic diastolic (congestive) heart failure: Secondary | ICD-10-CM | POA: Diagnosis not present

## 2016-12-20 DIAGNOSIS — M6281 Muscle weakness (generalized): Secondary | ICD-10-CM | POA: Diagnosis not present

## 2016-12-20 DIAGNOSIS — J189 Pneumonia, unspecified organism: Secondary | ICD-10-CM | POA: Diagnosis not present

## 2016-12-21 LAB — CULTURE, BLOOD (ROUTINE X 2)
CULTURE: NO GROWTH
Culture: NO GROWTH
Special Requests: ADEQUATE

## 2016-12-29 ENCOUNTER — Other Ambulatory Visit: Payer: Self-pay | Admitting: *Deleted

## 2016-12-29 ENCOUNTER — Encounter: Payer: Self-pay | Admitting: *Deleted

## 2016-12-29 DIAGNOSIS — J189 Pneumonia, unspecified organism: Secondary | ICD-10-CM

## 2016-12-29 DIAGNOSIS — J181 Lobar pneumonia, unspecified organism: Principal | ICD-10-CM

## 2016-12-29 NOTE — Patient Outreach (Signed)
Spoke with Santiago Glad at [facility] and patient has until noon to appeal.She suggested that if no one answers for the patient or wife to leave a message as they are date and time stamped.  Call to patient wife, had to leave a message regarding appeal time and that she should leave a message as they are date and time stamped.  Left RNCM contact. [name]. [name], RN, BSN, [place] [phone]) Business Cell  [phone]) Toll Free Office

## 2016-12-29 NOTE — Patient Outreach (Signed)
[facility]) Care Management  12/29/2016  Timothy Bradford November 15, 1935 [id number]  Met with Timothy Bradford, SW at facility, patient set to discharge 12/31/16  Met with patient and spouse at facility.  Wife states that she tried to appeal discharge decision, but neither she nor daughter could get through to [place], phone just rang.  RNCM will check on appeal time for wife.   She reports patient spiked a fever up to 100.3 yesterday and he has history of pneumonia, they did some labwork on patient but not sure if they will get results before going home.   Wife voices concerns around several issues, financial, caring for patient, patient health, caring for patient with confusion.  She has applied for Medicaid to help with Co-pays. She would like to get some help in home. She needs rollator walker and lift chair.   RNCM reviewed [facility] care management services.  RNCM left packet, verbal consent for services obtained. Wife states daughter wants to review anything before she signs. This RNCM reviewed [facility] consent and filled it out and requested that she review with daughter and sign after her daughter has reviewed the information.   Plan to refer to [facility] care management, [facility] community and LCSW. Plan to check on appeal time process   RNCM let Timothy Bradford know patient will need walker or rollator rx upon discharge and that wife was trying to appeal discharge.   Timothy Bradford. Timothy Purser, RN, BSN, [place] [phone]) Business Cell  [phone]) Toll Free Office

## 2016-12-30 ENCOUNTER — Other Ambulatory Visit: Payer: Self-pay | Admitting: *Deleted

## 2016-12-30 NOTE — Patient Outreach (Signed)
[facility]) Care Management  12/30/2016  Timothy Bradford [birth year] [id number]   Message rec'd from [place] that new information was received from facility and patient now being treated for pneumonia and patient will stay at facility for a few more days and will not discharge as planned 9/28  Plan to let [place] and LCSW know of delayed discharge. [name]. [name], RN, BSN, [place] [phone]) Business Cell  [phone]) Toll Free Office

## 2017-01-03 ENCOUNTER — Other Ambulatory Visit: Payer: Self-pay | Admitting: *Deleted

## 2017-01-03 NOTE — Patient Outreach (Signed)
[facility]) Care Management  01/03/2017  Kelvyn Schunk [birth year] [id number]   CSW made an initial attempt to try and contact patient and patient's wife, Kwesi Sangha today to perform phone assessment, as well as assess and assist with social needs and services, without success.  A HIPAA compliant message was left for patient and Mrs. Rimmer on [employer].  CSW is currently awaiting a return call. CSW will make a second outreach attempt within the next week, if CSW does not receive a return call from patient in the meantime. Nat Christen, BSW, MSW, LCSW  Licensed Education officer, environmental Health System  Mailing [place] [address] [place], [place] [address] Physical Address-[address], [place], [place] [address] Toll Free Main # [phone] Fax # [phone] Cell # [phone]  Office # [phone] [email or web address]

## 2017-01-05 ENCOUNTER — Other Ambulatory Visit: Payer: Self-pay | Admitting: *Deleted

## 2017-01-05 NOTE — Patient Outreach (Signed)
[facility]) Care Management  01/05/2017  Deanglo Hissong 12/24/1935 [id number]   CSW made a second attempt to try and contact patient today to perform phone assessment, as well as assess and assist with social needs and services, without success.  A HIPAA compliant message was left for patient on voicemail.  CSW is currently awaiting a return call. CSW will make a third and final outreach attempt within the next week, if CSW does not receive a return call from patient in the meantime. Nat Christen, BSW, MSW, LCSW  Licensed Education officer, environmental Health System  Mailing [place] [address] [place], [place] [address] Physical Address[address], [place], [place] [address] Toll Free Main # [phone] Fax # [phone] Cell # [phone]  Office # [phone] [email or web address]

## 2017-01-06 ENCOUNTER — Ambulatory Visit: Payer: PPO | Admitting: Hematology and Oncology

## 2017-01-06 ENCOUNTER — Telehealth: Payer: Self-pay

## 2017-01-06 NOTE — Telephone Encounter (Signed)
Pt wife called asking to reschedule appt with Dr.Gudena. Pt still at [facility] and would like to discuss treatment options with Dr.Gudena prior to discharge. Told pt that they may come on Monday 10/8. Confirmed time/date with pt for Monday.Notified Dr.Gudena.

## 2017-01-06 NOTE — Assessment & Plan Note (Deleted)
08/06/16 Reexcision axillary mass: Poorly diff Squamous cell carcinoma S/P excision with positive margins (tumor stuck to underlying structures)  left axilla large fixed mass was palpable measuring 4.1 x 5.3 x 4.2 cm Axillary lymph node biopsy 06/19/2015: Metastatic squamous cell carcinoma in left axillary mass  PET-CT 07/08/15: Large hypermetabolic mass in the left axilla measuring 7.4 cm with SUV 32, several smaller lymph nodes surrounding this. Nodular mass of squamous cell carcinoma involving deep dermis and subcutaneous soft tissue 8.6 cm, 0/5 benign LN, no lymph nodal tissue in the mass seen skin is negative,DD: Soft tissue tumor deposit vs lymph node replaced with SCC Adjuvant radiation therapy 10/01/2015 to 11/12/2015  CT CAP: 06/17/2016 3.1 cmor left axillary mass suspicious for local recurrence.No other evidence of distant metastatic disease Biopsy 06/24/2016: Poorly differentiated squamous cell carcinoma.  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 Last time in 11/27/2016 for HCAP and sepsis, heart failure and encephalopathy.

## 2017-01-07 DIAGNOSIS — J189 Pneumonia, unspecified organism: Secondary | ICD-10-CM | POA: Diagnosis not present

## 2017-01-07 DIAGNOSIS — F339 Major depressive disorder, recurrent, unspecified: Secondary | ICD-10-CM | POA: Diagnosis not present

## 2017-01-07 DIAGNOSIS — R2689 Other abnormalities of gait and mobility: Secondary | ICD-10-CM | POA: Diagnosis not present

## 2017-01-07 DIAGNOSIS — I5032 Chronic diastolic (congestive) heart failure: Secondary | ICD-10-CM | POA: Diagnosis not present

## 2017-01-10 ENCOUNTER — Ambulatory Visit (HOSPITAL_BASED_OUTPATIENT_CLINIC_OR_DEPARTMENT_OTHER): Payer: PPO | Admitting: Hematology and Oncology

## 2017-01-10 ENCOUNTER — Telehealth: Payer: Self-pay | Admitting: Hematology and Oncology

## 2017-01-10 ENCOUNTER — Other Ambulatory Visit: Payer: Self-pay

## 2017-01-10 ENCOUNTER — Telehealth: Payer: Self-pay

## 2017-01-10 DIAGNOSIS — C773 Secondary and unspecified malignant neoplasm of axilla and upper limb lymph nodes: Secondary | ICD-10-CM

## 2017-01-10 DIAGNOSIS — C799 Secondary malignant neoplasm of unspecified site: Secondary | ICD-10-CM

## 2017-01-10 DIAGNOSIS — M79622 Pain in left upper arm: Secondary | ICD-10-CM | POA: Diagnosis not present

## 2017-01-10 DIAGNOSIS — C801 Malignant (primary) neoplasm, unspecified: Secondary | ICD-10-CM | POA: Diagnosis not present

## 2017-01-10 DIAGNOSIS — M545 Low back pain: Secondary | ICD-10-CM

## 2017-01-10 DIAGNOSIS — IMO0002 Reserved for concepts with insufficient information to code with codable children: Secondary | ICD-10-CM

## 2017-01-10 MED ORDER — HYDROCODONE-ACETAMINOPHEN 5-325 MG PO TABS: 1.0000 | ORAL_TABLET | Freq: Four times a day (QID) | ORAL | 0 refills | Status: AC | PRN

## 2017-01-10 NOTE — Progress Notes (Signed)
Patient Care Team: Lars Mage, MD as PCP - General Druscilla Brownie, MD as Consulting Physician (Dermatology) Nicholas Lose, MD as Consulting Physician (Hematology and Oncology) Shirley Muscat Loreen Freud, MD as Referring Physician (Optometry) Tobi Bastos, RN as [place] [facility] [place], Maree Erie, LCSW as [place] [facility]  DIAGNOSIS:  Encounter Diagnosis  Name Primary?  . Metastatic squamous cell carcinoma ([place])     SUMMARY OF ONCOLOGIC HISTORY:   Metastatic squamous cell carcinoma ([place])   06/11/2015 Imaging    Mammogram: No suspicious masses in the breast; in the left axilla large fixed mass was palpable measuring 4.1 x 5.3 x 4.2 cm      06/19/2015 Initial Diagnosis    Metastatic squamous cell carcinoma in left axillary mass      08/12/2015 Surgery    Left axillary LND: Nodular mass of squamous cell carcinoma involving deep dermis and subcutaneous soft tissue 8.6 cm, 0/5 benign LN, no lymph nodal tissue in the mass seen skin is negative,DD: Soft tissue tumor deposit vs lymph node replaced with [facility]      10/01/2015 - 11/12/2015 Radiation Therapy    Radiation therapy to the axilla      08/05/2016 Relapse/Recurrence    Reexcision axillary mass: Poorly diff Squamous cell carcinoma S/P excision with positive margins (tumor stuck to underlying structures)      09/09/2016 Procedure    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       CHIEF COMPLIANT: Worsening left chest wall and back pain  INTERVAL HISTORY: Timothy Bradford is a 81 year old with above-mentioned history of squamous cell carcinoma of the axilla that  was previously resected and radiated and had a relapse and continued to be progressing slowly over time. Is currently residing in the nursing home. He was brought in today to discuss if there are any options for treatment. Previously he was so frail that we did not think he would be able to tolerate chemotherapy. At that time Foundation 1 analysis suggests that he may benefit from from abdomen. At the nursing home he continues to have a lot of pain and discomfort in his left axilla and is back. That is his biggest complaint this symptom has been worse over the past week. He is currently in a wheelchair.  REVIEW OF SYSTEMS:   Constitutional: Denies fevers, chills or abnormal weight loss Eyes: Denies blurriness of vision Ears, nose, mouth, throat, and face: Denies mucositis or sore throat Respiratory: Mild shortness of breath. Cardiovascular: Denies palpitation, chest discomfort Gastrointestinal:  Denies nausea, heartburn or change in bowel habits Skin: Denies abnormal skin rashes Lymphatics: Denies new lymphadenopathy or easy bruising Neurological: Generalized weaknesses Behavioral/Psych: Mood is stable, no new changes  Extremities: Severe pain in the left axilla area left arm is also swollen. All other systems were reviewed with the patient and are negative.  I have reviewed the past medical history, past surgical history, social history and family history with the patient and they are unchanged from previous note.  ALLERGIES:  is allergic to ace inhibitors and fosinopril sodium.  MEDICATIONS:  Current Outpatient Prescriptions  Medication Sig Dispense Refill  .  acetaminophen (TYLENOL) 500 MG tablet Take 500 mg by mouth every 6 (six) hours as needed (for pain.).    Marland Kitchen Amino Acids-Protein Hydrolys (FEEDING SUPPLEMENT, PRO-STAT SUGAR FREE 64,) LIQD Take 30 mLs by mouth 3 (three) times daily with meals.    Marland Kitchen aspirin EC 81 MG tablet Take 81 mg by mouth daily with breakfast.     .  dextromethorphan-guaiFENesin (MUCINEX DM) 30-600 MG 12hr tablet Take 1 tablet by mouth 2 (two) times daily. 30 tablet 0  . diclofenac sodium (VOLTAREN) 1 % GEL APPLY 2 GRAMS TOPICALLY 4 TIMES A DAY 100 g 2  . feeding supplement, ENSURE ENLIVE, (ENSURE ENLIVE) LIQD Take 237 mLs by mouth 3 (three) times daily between meals. 237 mL 12  . HYDROcodone-acetaminophen (NORCO/VICODIN) 5-325 MG tablet Take 1 tablet by mouth every 6 (six) hours as needed for severe pain. 90 tablet 0  . ibuprofen (ADVIL,MOTRIN) 200 MG tablet Take 800 mg by mouth every 8 (eight) hours as needed for fever, headache, mild pain, moderate pain or cramping.    Marland Kitchen ibuprofen (ADVIL,MOTRIN) 800 MG tablet Take 1 tablet (800 mg total) by mouth every 8 (eight) hours as needed. (Patient not taking: Reported on 11/27/2016) 30 tablet 0  . Infant Care Products (DERMACLOUD) CREA Apply 1 application topically 2 (two) times daily. Apply to penial shaft    . mirtazapine (REMERON) 15 MG tablet Take 1 tablet (15 mg total) by mouth at bedtime. 30 tablet 0  . polyethylene glycol (MIRALAX / GLYCOLAX) packet Take 17 g by mouth daily. 14 each 0  . Rivaroxaban 15 & 20 MG TBPK Take as directed on package: Start with one 15mg  tablet by mouth twice a day with food. On Day 22, switch to one 20mg  tablet once a day with food. 51 each 0  . simvastatin (ZOCOR) 40 MG tablet TAKE 1 TABLET BY MOUTH AT BEDTIME 90 tablet 3   No current facility-administered medications for this visit.     PHYSICAL EXAMINATION: ECOG PERFORMANCE STATUS: 3 - Symptomatic, >50% confined to bed  Vitals:   01/10/17 0933  BP: (!) 72/61  Pulse: (!) 131  Resp: 17  Temp: 98.9 F (37.2 C)  SpO2: 100%   Filed Weights   01/10/17 0933  Weight: 154 lb 8 oz (70.1 kg)    GENERAL:alert, no distress and comfortable SKIN: skin color, texture, turgor are normal, no rashes or significant lesions EYES: normal, Conjunctiva are pink and non-injected, sclera clear OROPHARYNX:no exudate, no  erythema and lips, buccal mucosa, and tongue normal  NECK: supple, thyroid normal size, non-tender, without nodularity LYMPH:  no palpable lymphadenopathy in the cervical, axillary or inguinal LUNGS: clear to auscultation and percussion with normal breathing effort HEART: regular rate & rhythm and no murmurs and no lower extremity edema ABDOMEN:abdomen soft, non-tender and normal bowel sounds MUSCULOSKELETAL:no cyanosis of digits and no clubbing  NEURO: alert & oriented x 3 with fluent speech, no focal motor/sensory deficits EXTREMITIES: Left arm swelling   LABORATORY DATA:  I have reviewed the data as listed   Chemistry      Component Value Date/Time   NA 137 12/16/2016 1321   NA 138 10/13/2016 1112   K 3.7 12/16/2016 1321   K 4.0 10/13/2016 1112   CL 102 12/16/2016 1321   CO2 29 12/16/2016 1321   CO2 22 10/13/2016 1112   BUN 10 12/16/2016 1321   BUN 17.4 10/13/2016 1112   CREATININE 0.67 12/16/2016 1321   CREATININE 0.9 10/13/2016 1112  Component Value Date/Time   CALCIUM 8.8 (L) 12/16/2016 1321   CALCIUM 9.6 10/13/2016 1112   ALKPHOS 81 12/16/2016 1321   ALKPHOS 84 10/13/2016 1112   AST 31 12/16/2016 1321   AST 13 10/13/2016 1112   ALT 21 12/16/2016 1321   ALT 14 10/13/2016 1112   BILITOT 1.0 12/16/2016 1321   BILITOT 0.51 10/13/2016 1112       Lab Results  Component Value Date   WBC 15.8 (H) 12/16/2016   HGB 8.7 (L) 12/16/2016   HCT 27.6 (L) 12/16/2016   MCV 84.9 12/16/2016   PLT 577 (H) 12/16/2016   NEUTROABS 11.8 (H) 12/16/2016    ASSESSMENT & PLAN:  Metastatic squamous cell carcinoma (HCC) 08/06/16 Reexcision axillary mass: Poorly diff Squamous cell carcinoma S/P excision with positive margins (tumor stuck to underlying structures)  left axilla large fixed mass was palpable measuring 4.1 x 5.3 x 4.2 cm Axillary lymph node biopsy 06/19/2015: Metastatic squamous cell carcinoma in left axillary mass  PET-CT 07/08/15: Large hypermetabolic mass in the  left axilla measuring 7.4 cm with SUV 32, several smaller lymph nodes surrounding this. Nodular mass of squamous cell carcinoma involving deep dermis and subcutaneous soft tissue 8.6 cm, 0/5 benign LN, no lymph nodal tissue in the mass seen skin is negative,DD: Soft tissue tumor deposit vs lymph node replaced with SCC Adjuvant radiation therapy 10/01/2015 to 11/12/2015  12/02/2016: CT chest Marked interval increase in size of LEFT axillary mass now penetrating the LEFT lateral chest wall into the LEFT pleural space, mass overall measuring at least 9.8 x 7.5 x 11.1 cm and associated with destruction of a small portion of the lateral LEFT second rib.  Patient and family previously chose not to undergo any treatment.  I discussed once again that no amount of treatment is going to offer him any significant palliation of pain or I did not recommend systemic chemotherapy on even Ponatinib. I recommended hospice care for the patient. Severe pain underneath the arm: I give prescription for Narco 5-3 25 every 6 hours as needed for pain. Recent pneumonia: Currently on Levaquin antibiotic DVTs: On Xarelto  Counseling: Abdomen the counseling going back and forth, we decided that hospice care may be the best decision for him. He is in no shape or form to be able to tolerate any systemic therapy. Return to clinic on an as-needed basis.  I spent 25 minutes talking to the patient of which more than half was spent in counseling and coordination of care.  No orders of the defined types were placed in this encounter.  The patient has a good understanding of the overall plan. he agrees with it. he will call with any problems that may develop before the next visit here.   Rulon Eisenmenger, MD 01/10/17

## 2017-01-10 NOTE — Assessment & Plan Note (Addendum)
08/06/16 Reexcision axillary mass: Poorly diff Squamous cell carcinoma S/P excision with positive margins (tumor stuck to underlying structures)  left axilla large fixed mass was palpable measuring 4.1 x 5.3 x 4.2 cm Axillary lymph node biopsy 06/19/2015: Metastatic squamous cell carcinoma in left axillary mass  PET-CT 07/08/15: Large hypermetabolic mass in the left axilla measuring 7.4 cm with SUV 32, several smaller lymph nodes surrounding this. Nodular mass of squamous cell carcinoma involving deep dermis and subcutaneous soft tissue 8.6 cm, 0/5 benign LN, no lymph nodal tissue in the mass seen skin is negative,DD: Soft tissue tumor deposit vs lymph node replaced with SCC Adjuvant radiation therapy 10/01/2015 to 11/12/2015  12/02/2016: CT chest Marked interval increase in size of LEFT axillary mass now penetrating the LEFT lateral chest wall into the LEFT pleural space, mass overall measuring at least 9.8 x 7.5 x 11.1 cm and associated with destruction of a small portion of the lateral LEFT second rib.  Patient and family previously chose not to undergo any treatment.

## 2017-01-10 NOTE — Telephone Encounter (Signed)
Per 10/8 - no los at checkout °

## 2017-01-10 NOTE — Telephone Encounter (Signed)
Called and placed hospice referral for pt. Spoke with [facility] and Palliative care and made referral today. They will be contacting pt today. Follow up on fmla papers for pt daughter. LVM for Timothy Bradford to follow up with pt.

## 2017-01-12 ENCOUNTER — Other Ambulatory Visit: Payer: Self-pay | Admitting: *Deleted

## 2017-01-12 ENCOUNTER — Encounter: Payer: Self-pay | Admitting: *Deleted

## 2017-01-12 NOTE — Patient Outreach (Signed)
[facility]) Care Management  01/12/2017  Timothy Bradford 1936/03/01 [id number]   Met with Marita Kansas, SW at facility, she reports that patient had oncology visit on Monday and will be discharging home with Hospice.   Plan to sign off and let [facility] care management care team know of Hospice status to close.  [name]. [name], RN, BSN, [place] [phone]) Business Cell  [phone]) Toll Free Office

## 2017-01-12 NOTE — Patient Outreach (Signed)
[facility]) Care Management  01/12/2017  Cutberto Winfree 1936-03-05 [id number]   CSW received an In [employer] in [place] from Burgess Amor, Care Management Coordinator with [place] [facility], indicating that patient has elected [place], which makes patient ineligible to continue to receive case management and disease management services through [facility].  CSW will perform a case closure on patient.  CSW will notify patient's RNCM with [place] [facility], Raina Mina of CSW's plans to close patient's case.  CSW will fax an update to patient's Primary Care Physician, Dr. Lars Mage to ensure that they are aware of CSW's involvement with patient's plan of care.  CSW will submit a case closure request to Verlon Setting, Care Management Assistant with [facility] [place] [facility], in the form of an In [employer].  CSW will ensure that Mrs. Comer is aware of Ms. Zigmund Daniel, RNCM with [facility] [facility], continued involvement with patient's care, but only for a brief period of time. Nat Christen, BSW, MSW, LCSW  Licensed Education officer, environmental [facility]  Mailing [place] [address] [place], [place] [address] Physical Address-[address], [place], [place] [address] Toll Free Main # [phone] Fax # [phone] Cell # [phone]  Office # [phone] [email or web address]

## 2017-01-13 ENCOUNTER — Ambulatory Visit: Payer: Self-pay | Admitting: *Deleted

## 2017-01-14 ENCOUNTER — Telehealth: Payer: Self-pay | Admitting: *Deleted

## 2017-01-14 ENCOUNTER — Encounter: Payer: PPO | Admitting: Internal Medicine

## 2017-01-14 NOTE — Telephone Encounter (Signed)
Received call from Timothy Bradford/Hospice/Palliative Care/[place] stating that pt will be staying at facility-?[facility] & was admitted to palliative care.  Message to Dr Lindi Adie.

## 2017-01-19 DIAGNOSIS — R531 Weakness: Secondary | ICD-10-CM | POA: Diagnosis not present

## 2017-01-25 ENCOUNTER — Telehealth: Payer: Self-pay | Admitting: Internal Medicine

## 2017-01-25 NOTE — Telephone Encounter (Signed)
[facility] CALLED, THEY RECEIVED THE REFERRAL FOR IN HOME CARE, NURSE WILL START TOMORROW 01/26/17

## 2017-01-28 ENCOUNTER — Telehealth: Payer: Self-pay | Admitting: Hematology and Oncology

## 2017-01-28 NOTE — Telephone Encounter (Signed)
Completed FMLA forms and faxed to [employer] Dessert per Purcell Mouton (patient's daughter) request to [phone] on 01/28/2017. Also e-mailed a copy of forms to [email or web address]

## 2017-03-05 DEATH — deceased

## 2018-01-11 IMAGING — CT CT BIOPSY
2 of 3 series · 16 of 29 positions shown, 19 images · non-contrast
Comparison: none

INDICATION: Metastatic squamous cell carcinoma, CT findings concerning for
recurrence at the left axillary resection site

[Series 2: i-spiral 5.0 b40f · axial · 0.82mm/px · z∈[-124,-12]mm · 10 of 42 slices shown, 13 images (1 of 2)]
[im 5/42  mediastinal]
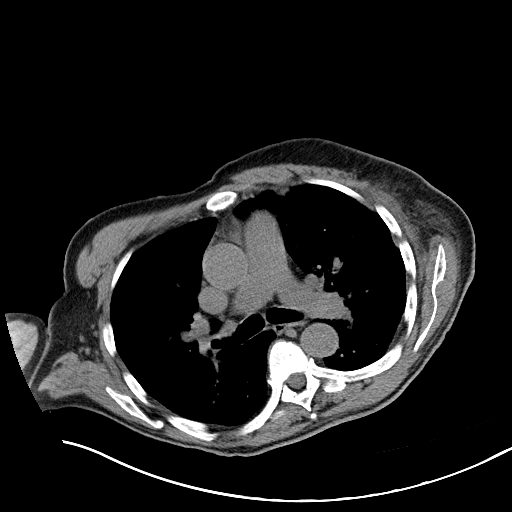
[im 5/42  lung]
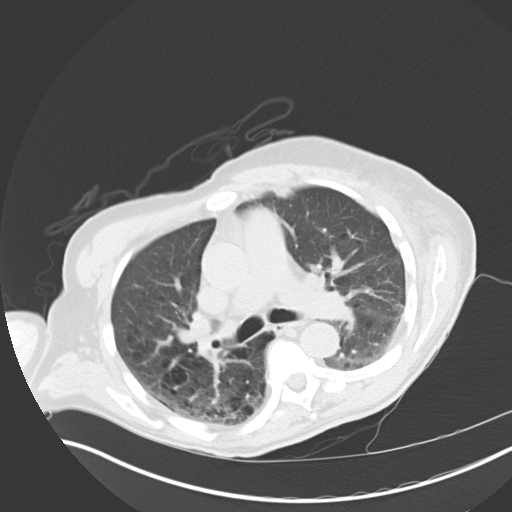
[im 9/42  lung]
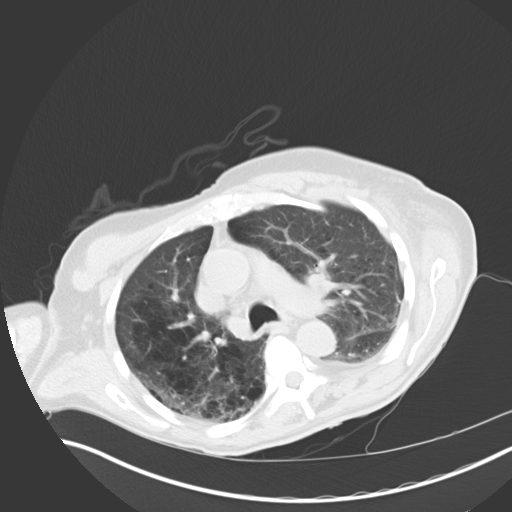
[im 13/42  lung]
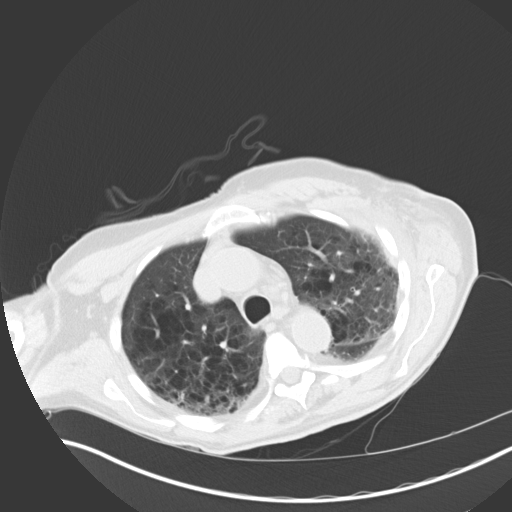
[im 17/42  lung]
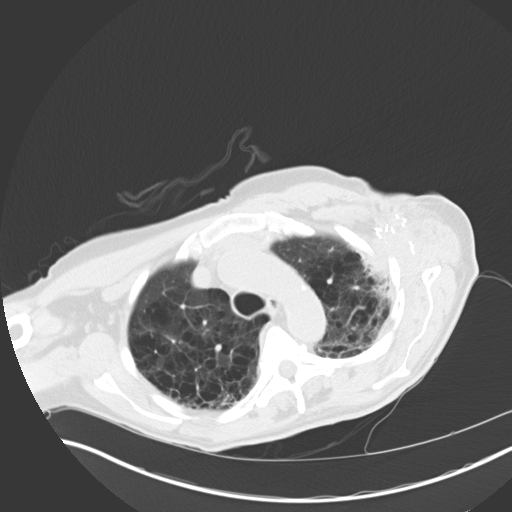
[im 19/42  mediastinal]
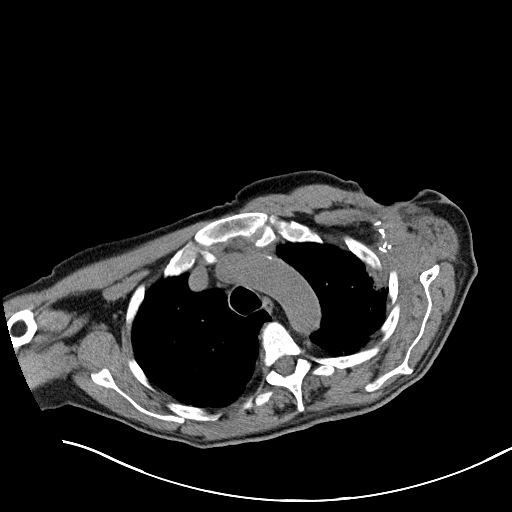
[im 19/42  lung]
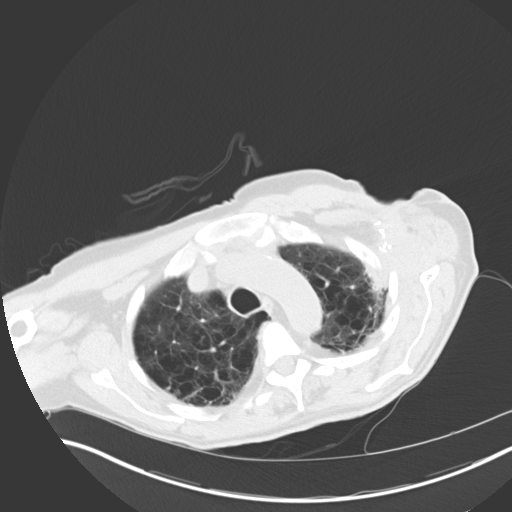
[im 21/42  lung]
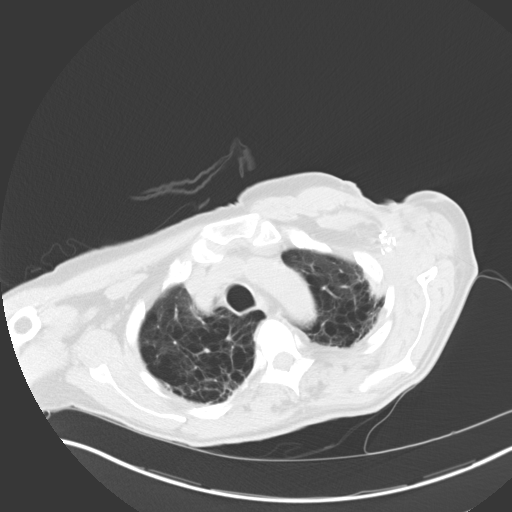
[im 25/42  lung]
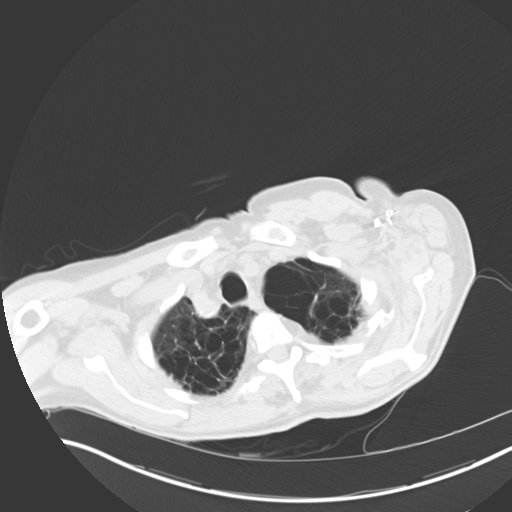
[im 29/42  lung]
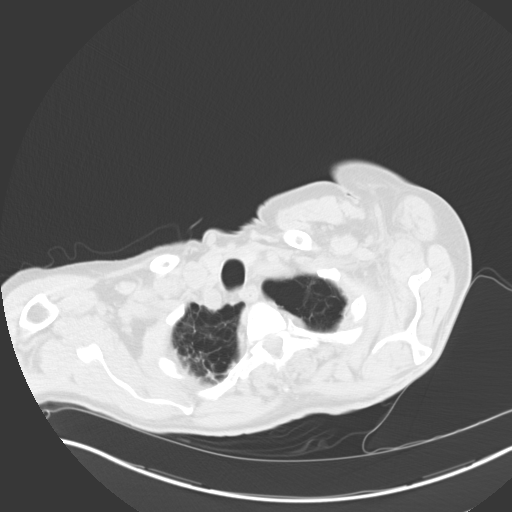
[im 33/42  mediastinal]
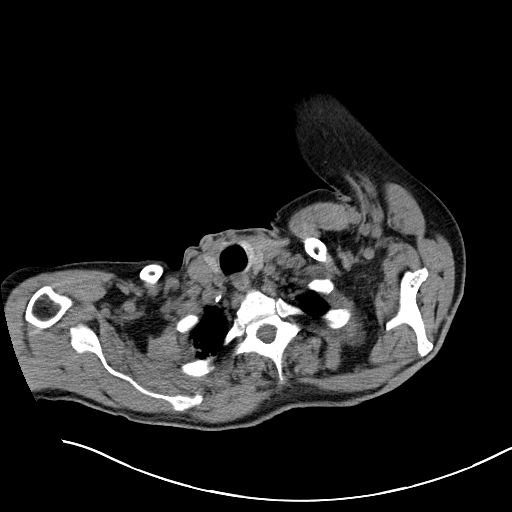
[im 33/42  lung]
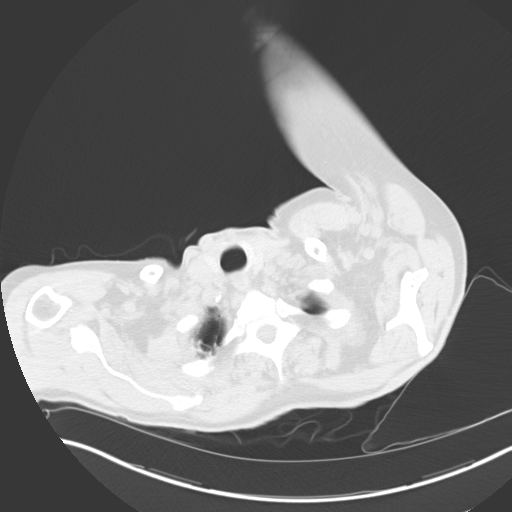
[im 37/42  lung]
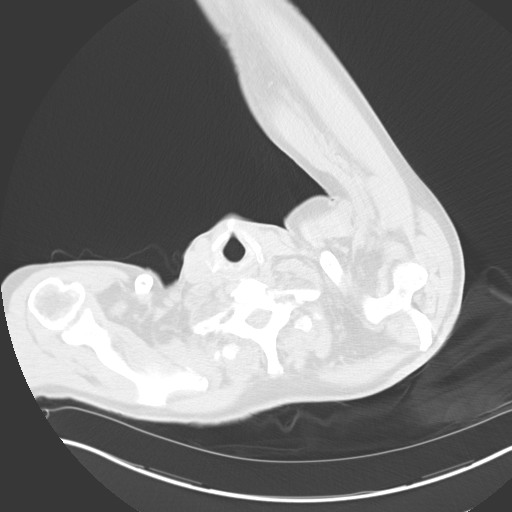

[Series 4: i-spiral 5.0 b40f · axial · 0.82mm/px · z∈[-127,-47]mm · 6 of 42 slices shown (2 of 2)]
[im 4/42  lung]
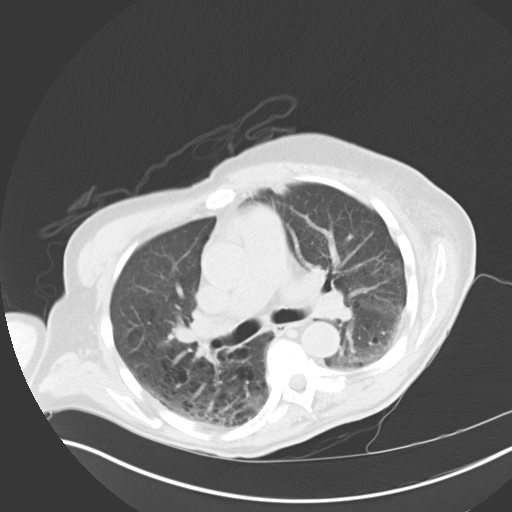
[im 8/42  lung]
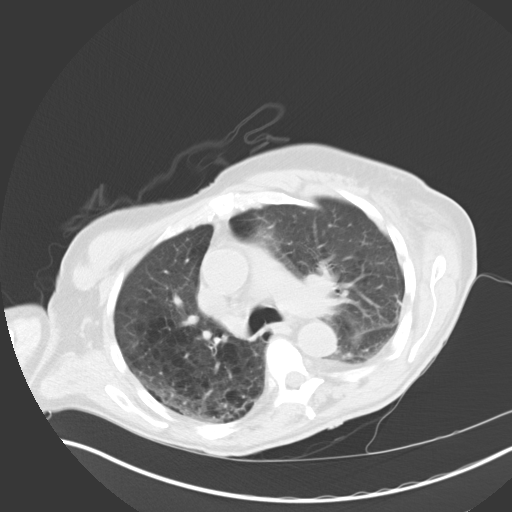
[im 15/42  lung]
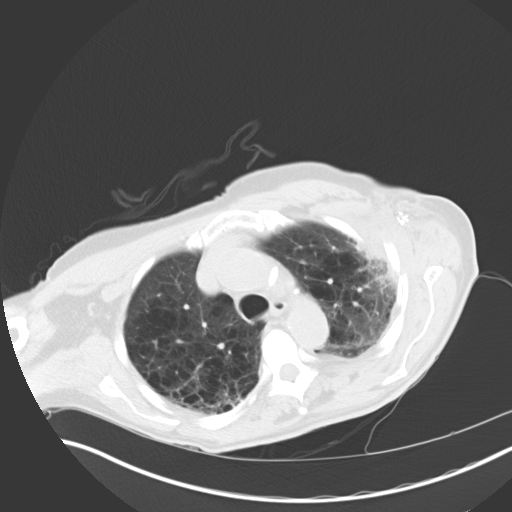
[im 19/42  lung]
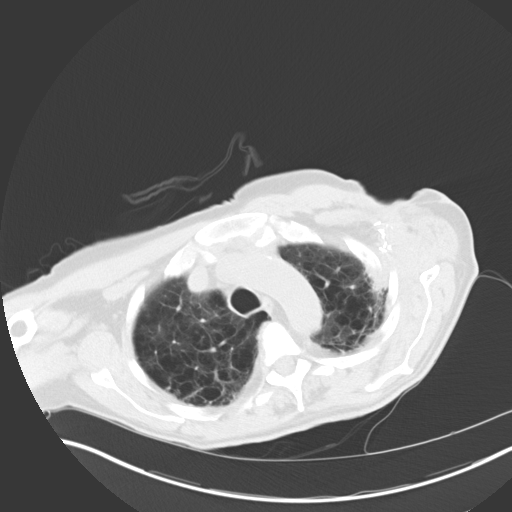
[im 23/42  lung]
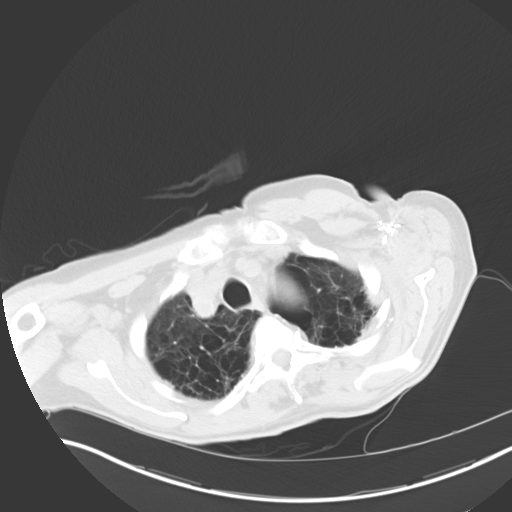
[im 27/42  lung]
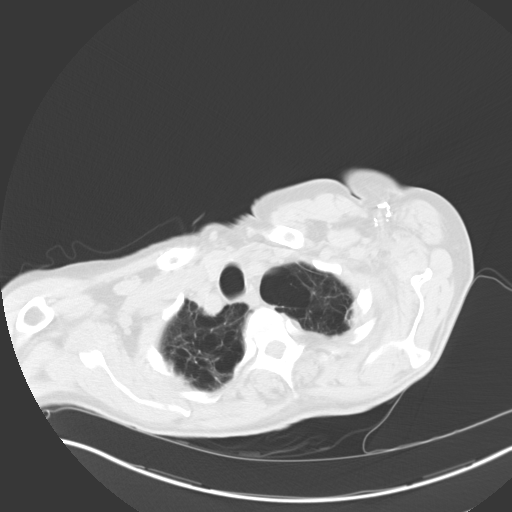

[16 of 29 positions shown; findings below may reference images not displayed]

EXAM:
CT-GUIDED BIOPSY LEFT AXILLARY MASS

MEDICATIONS:
1% LIDOCAINE LOCALLY

ANESTHESIA/SEDATION:
NONE.

PROCEDURE:
The procedure, risks, benefits, and alternatives were explained to
the patient. Questions regarding the procedure were encouraged and
answered. The patient understands and consents to the procedure.

Previous imaging reviewed. Patient positioned slightly left anterior
oblique. Noncontrast localization CT performed. The left axillary
mass was localized. Overlying skin marked.

Under sterile conditions and local anesthesia, a 17 gauge 6.8 cm
access needle was advanced from lateral approach to the left
axillary mass. Needle position was confirmed with CT and correlated
prior chest CT 06/17/2016. 2 18 gauge core biopsies were obtained.
These were intact and non fragmented. Samples placed in formalin.

Patient tolerated the procedure well without complication. Vital
sign monitoring by nursing staff during the procedure will continue
as patient is in the special procedures unit for post procedure
observation.
FINDINGS: The images document guide needle placement within the ill-defined
left axillary mass. Post biopsy images demonstrate no hemorrhage or
hematoma.

COMPLICATIONS:
None immediate.
IMPRESSION: Successful CT-guided core biopsy of the left axillary mass.

## 2018-07-05 IMAGING — DX DG CHEST 1V PORT
1 series · 1 of 1 positions shown · non-contrast
Comparison: 11/27/2016, 12/02/16

CLINICAL DATA: Follow-up pneumonia

EXAM:
PORTABLE CHEST 1 VIEW

[chest ap]
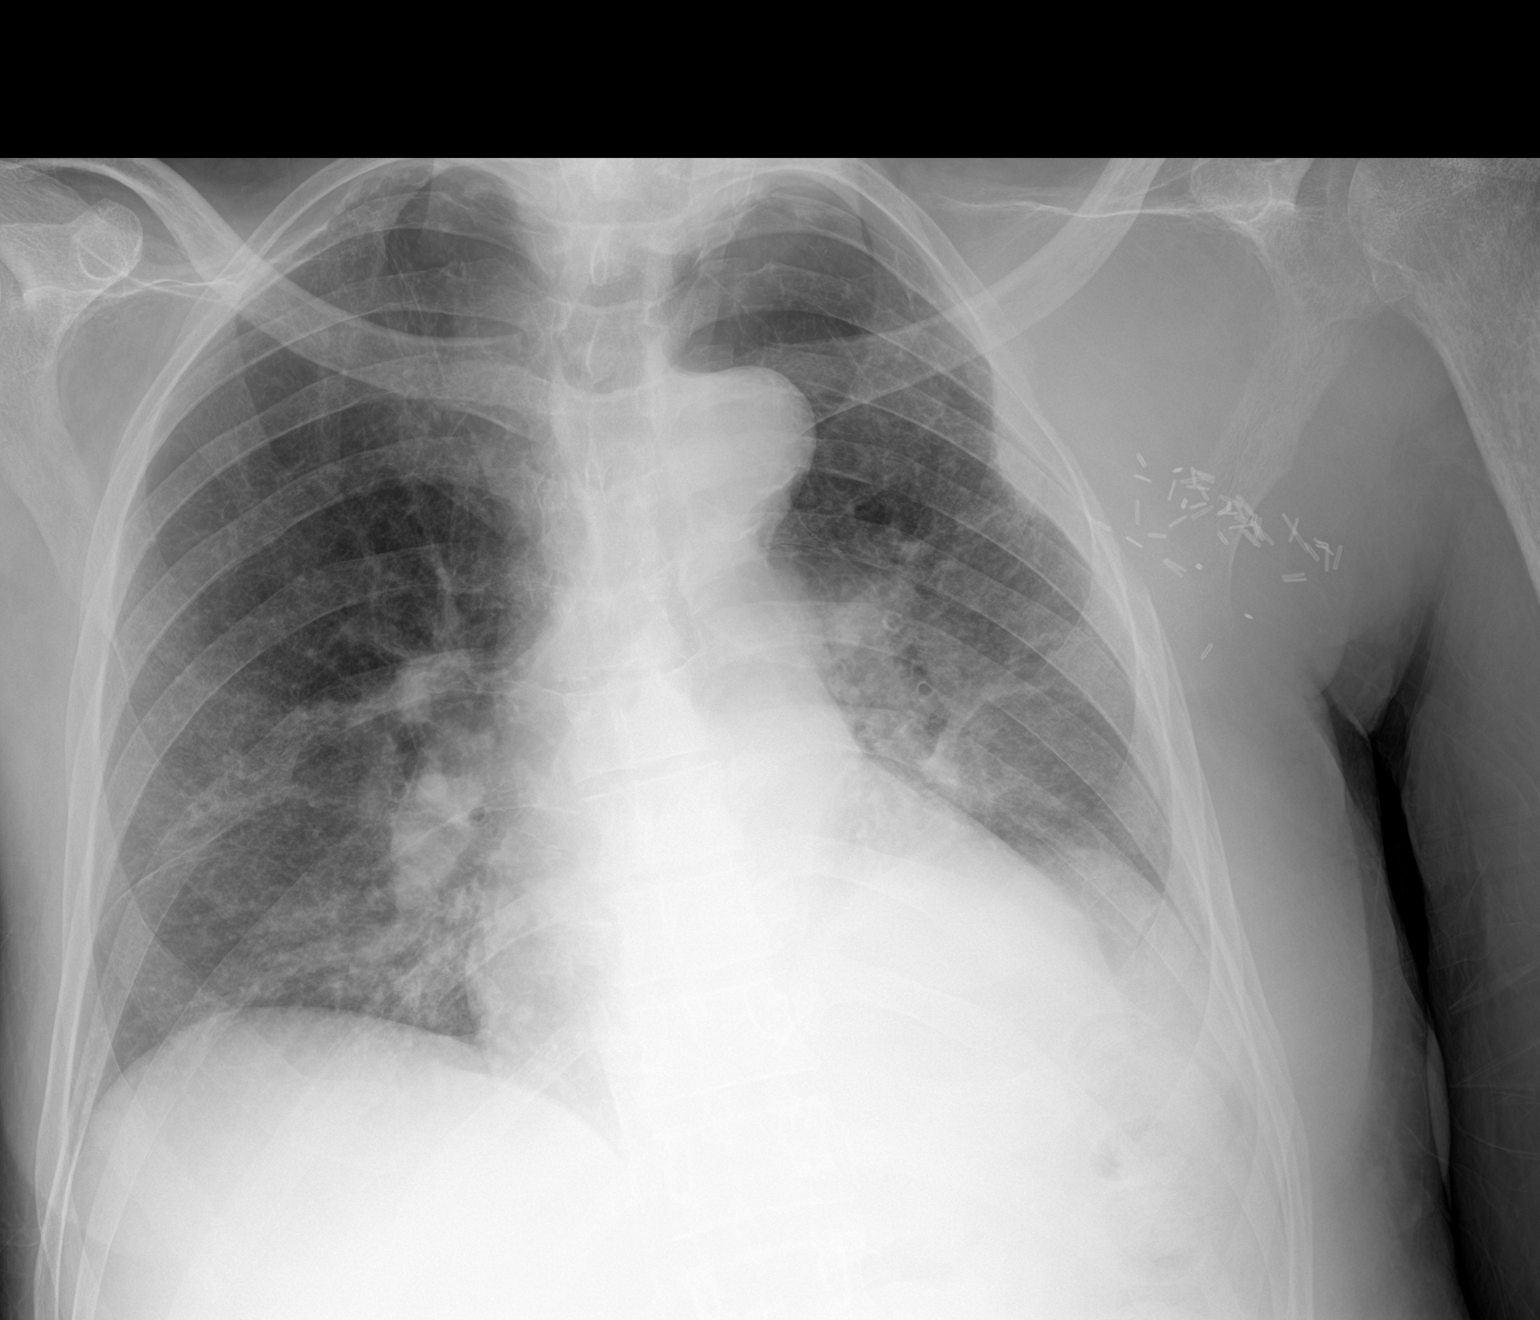

[1 of 1 positions shown; findings below may reference images not displayed]

FINDINGS: Increased soft tissue density is noted in the region of the left
axilla consistent with patient's known underlying mass lesion.
Pleural involvement and bony destruction in the left lateral chest
wall is again noted. Cardiac shadow is enlarged. Aortic
calcifications are again seen. Some patchy left basilar infiltrate
is again noted and stable. No pneumothorax is seen. No sizable
effusion is noted.
IMPRESSION: Stable left basilar changes.

Changes in the region of the left axilla and left chest wall stable
from the prior exam.

## 2019-08-28 ENCOUNTER — Other Ambulatory Visit: Payer: Self-pay | Admitting: *Deleted
